# Patient Record
Sex: Female | Born: 1965
Health system: Southern US, Community
[De-identification: ages and names within clinical notes are randomized; demographics above are authoritative.]

## PROBLEM LIST (undated history)

## (undated) DIAGNOSIS — I10 Essential (primary) hypertension: Secondary | ICD-10-CM

## (undated) DIAGNOSIS — T7840XA Allergy, unspecified, initial encounter: Secondary | ICD-10-CM

## (undated) DIAGNOSIS — R51 Headache: Secondary | ICD-10-CM

## (undated) DIAGNOSIS — E785 Hyperlipidemia, unspecified: Secondary | ICD-10-CM

## (undated) DIAGNOSIS — F329 Major depressive disorder, single episode, unspecified: Secondary | ICD-10-CM

## (undated) DIAGNOSIS — G4730 Sleep apnea, unspecified: Secondary | ICD-10-CM

## (undated) DIAGNOSIS — J069 Acute upper respiratory infection, unspecified: Secondary | ICD-10-CM

## (undated) DIAGNOSIS — F419 Anxiety disorder, unspecified: Secondary | ICD-10-CM

## (undated) DIAGNOSIS — M1990 Unspecified osteoarthritis, unspecified site: Secondary | ICD-10-CM

## (undated) DIAGNOSIS — D649 Anemia, unspecified: Secondary | ICD-10-CM

## (undated) DIAGNOSIS — E119 Type 2 diabetes mellitus without complications: Secondary | ICD-10-CM

## (undated) DIAGNOSIS — R011 Cardiac murmur, unspecified: Secondary | ICD-10-CM

## (undated) DIAGNOSIS — K449 Diaphragmatic hernia without obstruction or gangrene: Secondary | ICD-10-CM

## (undated) DIAGNOSIS — K219 Gastro-esophageal reflux disease without esophagitis: Secondary | ICD-10-CM

## (undated) DIAGNOSIS — F32A Depression, unspecified: Secondary | ICD-10-CM

## (undated) HISTORY — DX: Allergy, unspecified, initial encounter: T78.40XA

## (undated) HISTORY — DX: Essential (primary) hypertension: I10

## (undated) HISTORY — PX: FRACTURE SURGERY: SHX138

## (undated) HISTORY — DX: Headache: R51

## (undated) HISTORY — DX: Anemia, unspecified: D64.9

## (undated) HISTORY — DX: Cardiac murmur, unspecified: R01.1

## (undated) HISTORY — DX: Acute upper respiratory infection, unspecified: J06.9

## (undated) HISTORY — DX: Type 2 diabetes mellitus without complications: E11.9

## (undated) HISTORY — DX: Unspecified osteoarthritis, unspecified site: M19.90

## (undated) HISTORY — DX: Depression, unspecified: F32.A

## (undated) HISTORY — DX: Major depressive disorder, single episode, unspecified: F32.9

## (undated) HISTORY — DX: Sleep apnea, unspecified: G47.30

## (undated) HISTORY — DX: Diaphragmatic hernia without obstruction or gangrene: K44.9

## (undated) HISTORY — PX: CHOLECYSTECTOMY: SHX55

## (undated) HISTORY — DX: Gastro-esophageal reflux disease without esophagitis: K21.9

## (undated) HISTORY — DX: Anxiety disorder, unspecified: F41.9

## (undated) HISTORY — PX: TONSILLECTOMY: SUR1361

## (undated) HISTORY — DX: Hyperlipidemia, unspecified: E78.5

---

## 2005-06-10 ENCOUNTER — Ambulatory Visit: Payer: Self-pay | Admitting: Internal Medicine

## 2005-11-07 ENCOUNTER — Ambulatory Visit: Payer: Self-pay | Admitting: Internal Medicine

## 2006-03-03 HISTORY — PX: ENDOMETRIAL ABLATION: SHX621

## 2006-07-16 ENCOUNTER — Ambulatory Visit: Payer: Self-pay | Admitting: Internal Medicine

## 2007-01-21 ENCOUNTER — Ambulatory Visit: Payer: Self-pay | Admitting: Unknown Physician Specialty

## 2007-01-30 ENCOUNTER — Observation Stay: Payer: Self-pay | Admitting: Orthopaedic Surgery

## 2007-07-20 ENCOUNTER — Ambulatory Visit: Payer: Self-pay | Admitting: Internal Medicine

## 2007-12-10 ENCOUNTER — Ambulatory Visit: Payer: Self-pay | Admitting: Urology

## 2008-07-17 ENCOUNTER — Ambulatory Visit: Payer: Self-pay | Admitting: Urology

## 2008-07-21 ENCOUNTER — Ambulatory Visit: Payer: Self-pay | Admitting: Internal Medicine

## 2008-11-29 ENCOUNTER — Telehealth (INDEPENDENT_AMBULATORY_CARE_PROVIDER_SITE_OTHER): Payer: Self-pay | Admitting: *Deleted

## 2008-11-30 ENCOUNTER — Telehealth (INDEPENDENT_AMBULATORY_CARE_PROVIDER_SITE_OTHER): Payer: Self-pay | Admitting: *Deleted

## 2009-08-09 ENCOUNTER — Ambulatory Visit: Payer: Self-pay | Admitting: Internal Medicine

## 2009-10-04 ENCOUNTER — Ambulatory Visit: Payer: Self-pay | Admitting: Urology

## 2010-08-21 ENCOUNTER — Ambulatory Visit: Payer: Self-pay | Admitting: Internal Medicine

## 2010-11-12 ENCOUNTER — Other Ambulatory Visit: Payer: Self-pay | Admitting: Orthopedic Surgery

## 2010-11-12 DIAGNOSIS — M79672 Pain in left foot: Secondary | ICD-10-CM

## 2010-11-12 DIAGNOSIS — M25572 Pain in left ankle and joints of left foot: Secondary | ICD-10-CM

## 2010-11-13 ENCOUNTER — Emergency Department: Payer: Self-pay | Admitting: Emergency Medicine

## 2010-11-19 ENCOUNTER — Ambulatory Visit
Admission: RE | Admit: 2010-11-19 | Discharge: 2010-11-19 | Disposition: A | Discharge status: Home / Self Care | Payer: BC Managed Care – PPO | Source: Ambulatory Visit | Attending: Orthopedic Surgery | Admitting: Orthopedic Surgery

## 2010-11-19 ENCOUNTER — Other Ambulatory Visit: Payer: Self-pay | Admitting: Orthopedic Surgery

## 2010-11-19 DIAGNOSIS — M25572 Pain in left ankle and joints of left foot: Secondary | ICD-10-CM

## 2010-11-19 DIAGNOSIS — M79672 Pain in left foot: Secondary | ICD-10-CM

## 2011-01-02 ENCOUNTER — Encounter (HOSPITAL_COMMUNITY): Payer: BC Managed Care – PPO

## 2011-01-02 ENCOUNTER — Encounter (HOSPITAL_COMMUNITY): Payer: Self-pay

## 2011-01-02 LAB — SURGICAL PCR SCREEN
MRSA, PCR: INVALID — AB
Staphylococcus aureus: INVALID — AB

## 2011-01-02 LAB — HCG, SERUM, QUALITATIVE: Preg, Serum: NEGATIVE

## 2011-01-02 NOTE — Patient Instructions (Signed)
22 Sheri Hood  01/02/2011   Your procedure is scheduled on: 01/08/11  Report to [facility] at 1115 AM.  Call this number if you have problems the morning of surgery: [phone]   Remember:   LEAVE CONTACTS AT HOME   Do not eat food:After Midnight. Tues night  Do not drink clear liquids: After Midnight.Tuesday night  Take these medicines the morning of surgery with A SIP OF WATER: prolisec with sip water   Do not wear jewelry, make-up or nail polish.  Do not wear lotions, powders, or perfumes. You may wear deodorant.  Do not shave 48 hours prior to surgery.  Do not bring valuables to the hospital.  Contacts, dentures or bridgework may not be worn into surgery.  Leave suitcase in the car. After surgery it may be brought to your room.  For patients admitted to the hospital, checkout time is 11:00 AM the day of discharge.   Patients discharged the day of surgery will not be allowed to drive home.  Name and phone number of your driver: husband  Special Instructions: CHG Shower Use Special Wash: 1/2 bottle night before surgery and 1/2 bottle morning of surgery.              Regular soap face and privates, no shaving after Saturday  Please read over the following fact sheets that you were given: MRSA Information

## 2011-01-02 NOTE — Pre-Procedure Instructions (Signed)
01/02/11- MRSA  Specimen sent for culture noted

## 2011-01-05 LAB — MRSA CULTURE

## 2011-01-07 NOTE — Pre-Procedure Instructions (Signed)
ECHO from 12/30/10 on chart

## 2011-01-08 ENCOUNTER — Encounter (HOSPITAL_COMMUNITY): Payer: Self-pay | Admitting: Registered Nurse

## 2011-01-08 ENCOUNTER — Ambulatory Visit (HOSPITAL_COMMUNITY): Payer: BC Managed Care – PPO | Admitting: Registered Nurse

## 2011-01-08 ENCOUNTER — Ambulatory Visit (HOSPITAL_COMMUNITY): Payer: BC Managed Care – PPO

## 2011-01-08 ENCOUNTER — Encounter (HOSPITAL_COMMUNITY): Payer: Self-pay | Admitting: *Deleted

## 2011-01-08 ENCOUNTER — Encounter (HOSPITAL_COMMUNITY)
Admission: AD | Disposition: A | Discharge status: Home Health | Payer: Self-pay | Source: Ambulatory Visit | Attending: Orthopedic Surgery

## 2011-01-08 ENCOUNTER — Inpatient Hospital Stay (HOSPITAL_COMMUNITY)
Admission: AD | Admit: 2011-01-08 | Discharge: 2011-01-11 | DRG: 867 | Disposition: A | Discharge status: Home Health | Payer: BC Managed Care – PPO | Source: Ambulatory Visit | Attending: Orthopedic Surgery | Admitting: Orthopedic Surgery

## 2011-01-08 DIAGNOSIS — M25579 Pain in unspecified ankle and joints of unspecified foot: Secondary | ICD-10-CM

## 2011-01-08 DIAGNOSIS — Y834 Other reconstructive surgery as the cause of abnormal reaction of the patient, or of later complication, without mention of misadventure at the time of the procedure: Secondary | ICD-10-CM | POA: Diagnosis present

## 2011-01-08 DIAGNOSIS — M129 Arthropathy, unspecified: Secondary | ICD-10-CM | POA: Diagnosis present

## 2011-01-08 DIAGNOSIS — J45909 Unspecified asthma, uncomplicated: Secondary | ICD-10-CM | POA: Diagnosis present

## 2011-01-08 DIAGNOSIS — Q742 Other congenital malformations of lower limb(s), including pelvic girdle: Secondary | ICD-10-CM

## 2011-01-08 DIAGNOSIS — I10 Essential (primary) hypertension: Secondary | ICD-10-CM | POA: Diagnosis present

## 2011-01-08 DIAGNOSIS — T8489XA Other specified complication of internal orthopedic prosthetic devices, implants and grafts, initial encounter: Principal | ICD-10-CM | POA: Diagnosis present

## 2011-01-08 DIAGNOSIS — Z01812 Encounter for preprocedural laboratory examination: Secondary | ICD-10-CM

## 2011-01-08 HISTORY — PX: HARDWARE REMOVAL: SHX979

## 2011-01-08 SURGERY — REMOVAL, HARDWARE
Anesthesia: General | Site: Ankle | Laterality: Left

## 2011-01-08 MED ORDER — INSULIN ASPART 100 UNIT/ML ~~LOC~~ SOLN
0.0000 [IU] | SUBCUTANEOUS | Status: DC
  Administered 2011-01-08: 7 [IU] via SUBCUTANEOUS
  Administered 2011-01-09: 3 [IU] via SUBCUTANEOUS
  Administered 2011-01-09: 4 [IU] via SUBCUTANEOUS
  Filled 2011-01-08: qty 3

## 2011-01-08 MED ORDER — MIDAZOLAM HCL 5 MG/5ML IJ SOLN
INTRAMUSCULAR | Status: DC | PRN
  Administered 2011-01-08: 2 mg via INTRAVENOUS

## 2011-01-08 MED ORDER — HYDRALAZINE HCL 20 MG/ML IJ SOLN
INTRAMUSCULAR | Status: DC | PRN
  Administered 2011-01-08: 2.5 mg via INTRAVENOUS
  Administered 2011-01-08: 5 mg via INTRAVENOUS

## 2011-01-08 MED ORDER — ACETAMINOPHEN 10 MG/ML IV SOLN
INTRAVENOUS | Status: AC
  Filled 2011-01-08: qty 100

## 2011-01-08 MED ORDER — DROPERIDOL 2.5 MG/ML IJ SOLN
INTRAMUSCULAR | Status: DC | PRN
  Administered 2011-01-08: 0.625 mg via INTRAVENOUS

## 2011-01-08 MED ORDER — ACETAMINOPHEN 10 MG/ML IV SOLN
INTRAVENOUS | Status: DC | PRN
  Administered 2011-01-08: 1000 mg via INTRAVENOUS

## 2011-01-08 MED ORDER — KETAMINE HCL 10 MG/ML IJ SOLN
INTRAMUSCULAR | Status: DC | PRN
  Administered 2011-01-08: .2 mg via INTRAVENOUS
  Administered 2011-01-08: .15 mg via INTRAVENOUS
  Administered 2011-01-08: .2 mg via INTRAVENOUS
  Administered 2011-01-08: .3 mg via INTRAVENOUS
  Administered 2011-01-08: .15 mg via INTRAVENOUS
  Administered 2011-01-08: 1 mg via INTRAVENOUS

## 2011-01-08 MED ORDER — HYDROMORPHONE 0.3 MG/ML IV SOLN
INTRAVENOUS | Status: DC
  Administered 2011-01-08 – 2011-01-09 (×2): 7.5 mg via INTRAVENOUS
  Administered 2011-01-09: 3.9 mg via INTRAVENOUS
  Administered 2011-01-09 (×2): 2.4 mg via INTRAVENOUS
  Administered 2011-01-09: 4.5 mg via INTRAVENOUS
  Administered 2011-01-09: 1.2 mg via INTRAVENOUS
  Administered 2011-01-10: 0.9 mg via INTRAVENOUS
  Administered 2011-01-10: 1.2 mg via INTRAVENOUS
  Filled 2011-01-08 (×4): qty 25

## 2011-01-08 MED ORDER — CEFAZOLIN SODIUM 1-5 GM-% IV SOLN
2.0000 g | INTRAVENOUS | Status: AC
  Administered 2011-01-08: 2000 mg via INTRAVENOUS

## 2011-01-08 MED ORDER — LACTATED RINGERS IV SOLN
INTRAVENOUS | Status: DC
  Administered 2011-01-08: 1000 mL via INTRAVENOUS

## 2011-01-08 MED ORDER — NALOXONE HCL 0.4 MG/ML IJ SOLN
0.4000 mg | INTRAMUSCULAR | Status: DC | PRN
  Filled 2011-01-08: qty 1

## 2011-01-08 MED ORDER — SUFENTANIL CITRATE 50 MCG/ML IV SOLN
INTRAVENOUS | Status: DC | PRN
  Administered 2011-01-08 (×2): 5 ug via INTRAVENOUS
  Administered 2011-01-08: 10 ug via INTRAVENOUS
  Administered 2011-01-08: 5 ug via INTRAVENOUS
  Administered 2011-01-08: 15 ug via INTRAVENOUS
  Administered 2011-01-08 (×2): 5 ug via INTRAVENOUS

## 2011-01-08 MED ORDER — PROPOFOL 10 MG/ML IV EMUL
INTRAVENOUS | Status: DC | PRN
  Administered 2011-01-08: 200 mg via INTRAVENOUS

## 2011-01-08 MED ORDER — HYDROMORPHONE HCL PF 1 MG/ML IJ SOLN
INTRAMUSCULAR | Status: DC | PRN
  Administered 2011-01-08 (×4): 0.5 mg via INTRAVENOUS

## 2011-01-08 MED ORDER — LABETALOL HCL 5 MG/ML IV SOLN
INTRAVENOUS | Status: DC | PRN
  Administered 2011-01-08 (×3): 5 mg via INTRAVENOUS

## 2011-01-08 MED ORDER — FENTANYL CITRATE 0.05 MG/ML IJ SOLN
INTRAMUSCULAR | Status: DC | PRN
  Administered 2011-01-08: 100 ug via INTRAVENOUS
  Administered 2011-01-08: 50 ug via INTRAVENOUS
  Administered 2011-01-08: 100 ug via INTRAVENOUS

## 2011-01-08 MED ORDER — CEFAZOLIN SODIUM 1-5 GM-% IV SOLN
INTRAVENOUS | Status: AC
  Filled 2011-01-08: qty 100

## 2011-01-08 MED ORDER — ROCURONIUM BROMIDE 100 MG/10ML IV SOLN
INTRAVENOUS | Status: DC | PRN
  Administered 2011-01-08: 50 mg via INTRAVENOUS

## 2011-01-08 MED ORDER — SODIUM CHLORIDE 0.9 % IJ SOLN: 9.0000 mL | INTRAMUSCULAR | Status: DC | PRN

## 2011-01-08 MED ORDER — LACTATED RINGERS IV SOLN
INTRAVENOUS | Status: DC | PRN
  Administered 2011-01-08: 15:00:00 via INTRAVENOUS
  Administered 2011-01-08: 1000 mL via INTRAVENOUS
  Administered 2011-01-08 (×2): via INTRAVENOUS

## 2011-01-08 MED ORDER — HYDROMORPHONE HCL PF 1 MG/ML IJ SOLN: 0.2500 mg | INTRAMUSCULAR | Status: DC | PRN

## 2011-01-08 MED ORDER — BUPIVACAINE HCL 0.25 % IJ SOLN
INTRAMUSCULAR | Status: DC | PRN
  Administered 2011-01-08: 8 mL

## 2011-01-08 MED ORDER — INSULIN ASPART 100 UNIT/ML ~~LOC~~ SOLN
SUBCUTANEOUS | Status: AC
  Filled 2011-01-08: qty 3

## 2011-01-08 MED ORDER — PROMETHAZINE HCL 25 MG/ML IJ SOLN: 6.2500 mg | INTRAMUSCULAR | Status: DC | PRN

## 2011-01-08 MED ORDER — BUPIVACAINE HCL (PF) 0.25 % IJ SOLN
INTRAMUSCULAR | Status: AC
  Filled 2011-01-08: qty 30

## 2011-01-08 MED ORDER — DEXAMETHASONE SODIUM PHOSPHATE 4 MG/ML IJ SOLN
INTRAMUSCULAR | Status: DC | PRN
  Administered 2011-01-08: 10 mg via INTRAVENOUS

## 2011-01-08 MED ORDER — ONDANSETRON HCL 4 MG/2ML IJ SOLN
4.0000 mg | Freq: Four times a day (QID) | INTRAMUSCULAR | Status: DC | PRN
  Administered 2011-01-08: 4 mg via INTRAVENOUS
  Filled 2011-01-08 (×2): qty 2

## 2011-01-08 MED ORDER — LIDOCAINE HCL (CARDIAC) 20 MG/ML IV SOLN
INTRAVENOUS | Status: DC | PRN
  Administered 2011-01-08: 100 mg via INTRAVENOUS

## 2011-01-08 MED ORDER — ONDANSETRON HCL 4 MG/2ML IJ SOLN
INTRAMUSCULAR | Status: DC | PRN
  Administered 2011-01-08: 4 mg via INTRAVENOUS

## 2011-01-08 MED ORDER — ONDANSETRON HCL 4 MG/2ML IJ SOLN
INTRAMUSCULAR | Status: AC
  Filled 2011-01-08: qty 2

## 2011-01-08 SURGICAL SUPPLY — 36 items
BAG SPEC THK2 15X12 ZIP CLS (MISCELLANEOUS) ×1
BAG ZIPLOCK 12X15 (MISCELLANEOUS) ×2 IMPLANT
BANDAGE GAUZE ELAST BULKY 4 IN (GAUZE/BANDAGES/DRESSINGS) ×1 IMPLANT
BONE CHIP PRESERV 30CC PCAN30 (Bone Implant) ×2 IMPLANT
BUR RND DIAMOND ELITE 4.0 (BURR) ×1 IMPLANT
CLOTH BEACON ORANGE TIMEOUT ST (SAFETY) ×2 IMPLANT
DRAPE STERI IOBAN 125X83 (DRAPES) IMPLANT
DRSG ADAPTIC 3X8 NADH LF (GAUZE/BANDAGES/DRESSINGS) ×1 IMPLANT
DRSG EMULSION OIL 3X16 NADH (GAUZE/BANDAGES/DRESSINGS) ×2 IMPLANT
DRSG PAD ABDOMINAL 8X10 ST (GAUZE/BANDAGES/DRESSINGS) ×2 IMPLANT
ELECT REM PT RETURN 9FT ADLT (ELECTROSURGICAL) ×2
ELECTRODE REM PT RTRN 9FT ADLT (ELECTROSURGICAL) ×1 IMPLANT
GLOVE BIO SURGEON STRL SZ7.5 (GLOVE) ×2 IMPLANT
GLOVE BIOGEL PI IND STRL 8 (GLOVE) ×1 IMPLANT
GLOVE BIOGEL PI INDICATOR 8 (GLOVE) ×1
GLOVE ECLIPSE 8.0 STRL XLNG CF (GLOVE) ×2 IMPLANT
GRAFT BNE CANC CHIPS 1-8 30CC (Bone Implant) IMPLANT
KIT BASIN OR (CUSTOM PROCEDURE TRAY) ×2 IMPLANT
MANIFOLD NEPTUNE II (INSTRUMENTS) ×2 IMPLANT
NS IRRIG 1000ML POUR BTL (IV SOLUTION) ×2 IMPLANT
PACK LOWER EXTREMITY WL (CUSTOM PROCEDURE TRAY) ×2 IMPLANT
PADDING CAST COTTON 6X4 STRL (CAST SUPPLIES) ×1 IMPLANT
PADDING WEBRIL 4 STERILE (GAUZE/BANDAGES/DRESSINGS) ×1 IMPLANT
POSITIONER SURGICAL ARM (MISCELLANEOUS) ×2 IMPLANT
SPLINT FAST PLASTER 5X30 (CAST SUPPLIES) ×1
SPLINT PLASTER CAST FAST 5X30 (CAST SUPPLIES) IMPLANT
SPONGE GAUZE 4X4 12PLY (GAUZE/BANDAGES/DRESSINGS) ×2 IMPLANT
STAPLER VISISTAT 35W (STAPLE) IMPLANT
SUCTION FRAZIER TIP 10 FR DISP (SUCTIONS) ×1 IMPLANT
SUT ETHILON 4 0 PS 2 18 (SUTURE) ×2 IMPLANT
SUT MNCRL AB 4-0 PS2 18 (SUTURE) IMPLANT
SUT VIC AB 1 CT1 27 (SUTURE) ×2
SUT VIC AB 1 CT1 27XBRD ANTBC (SUTURE) ×1 IMPLANT
SUT VIC AB 2-0 CT1 27 (SUTURE) ×2
SUT VIC AB 2-0 CT1 TAPERPNT 27 (SUTURE) ×1 IMPLANT
WATER STERILE IRR 1500ML POUR (IV SOLUTION) ×2 IMPLANT

## 2011-01-08 NOTE — Preoperative (Signed)
Beta Blockers   Reason not to administer Beta Blockers:Not Applicable 

## 2011-01-08 NOTE — Transfer of Care (Signed)
Immediate Anesthesia Transfer of Care Note  Patient: Sheri Hood  Procedure(s) Performed:  HARDWARE REMOVAL - screw and plate left ankle and Fusion of Subtalor Joint Left Ankle    (Large c-arm)   Patient Location: PACU  Anesthesia Type: General  Level of Consciousness: awake, patient cooperative and responds to stimulation  Airway & Oxygen Therapy: Patient Spontanous Breathing and Patient connected to face mask oxygen  Post-op Assessment: Report given to PACU RN, Post -op Vital signs reviewed and stable and Patient moving all extremities X 4  Post vital signs: Reviewed and stable  Complications: No apparent anesthesia complications

## 2011-01-08 NOTE — Anesthesia Procedure Notes (Signed)
Procedures

## 2011-01-08 NOTE — Anesthesia Preprocedure Evaluation (Signed)
Anesthesia Evaluation  Patient identified by MRN, date of birth, ID band Patient awake    Reviewed: Allergy & Precautions, H&P , NPO status , Patient's Chart, lab work & pertinent test results  Airway Mallampati: II TM Distance: >3 FB Neck ROM: Full    Dental No notable dental hx. (+) Teeth Intact   Pulmonary neg pulmonary ROS,  clear to auscultation  Pulmonary exam normal       Cardiovascular neg cardio ROS Regular Normal    Neuro/Psych Negative Neurological ROS  Negative Psych ROS   GI/Hepatic negative GI ROS, Neg liver ROS,   Endo/Other  Negative Endocrine ROS  Renal/GU negative Renal ROS  Genitourinary negative   Musculoskeletal negative musculoskeletal ROS (+)   Abdominal (+) obese,   Peds negative pediatric ROS (+)  Hematology negative hematology ROS (+)   Anesthesia Other Findings   Reproductive/Obstetrics negative OB ROS                           Anesthesia Physical Anesthesia Plan  ASA: II  Anesthesia Plan: General   Post-op Pain Management:    Induction: Intravenous  Airway Management Planned: Oral ETT  Additional Equipment:   Intra-op Plan:   Post-operative Plan: Extubation in OR  Informed Consent: I have reviewed the patients History and Physical, chart, labs and discussed the procedure including the risks, benefits and alternatives for the proposed anesthesia with the patient or authorized representative who has indicated his/her understanding and acceptance.     Plan Discussed with: CRNA  Anesthesia Plan Comments:         Anesthesia Quick Evaluation

## 2011-01-08 NOTE — Brief Op Note (Signed)
01/08/2011  5:42 PM  PATIENT:  Henrene Pastor  45 y.o. female  PRE-OPERATIVE DIAGNOSIS:  left ankle hardware retained, talus calcaneal coalitions  POST-OPERATIVE DIAGNOSIS:  left ankle retained hardware, talus calcaneous coalitions  PROCEDURE:  Procedure(s): HARDWARE REMOVALand subtalar fusion   SURGEON:  Surgeon(s): Fayrene Fearing P Aplington Ronald A Gioffre  PHYSICIAN ASSISTANT:   ASSISTANTS: none   ANESTHESIA:   general  EBL:  Total I/O In: 2000 [I.V.:2000] Out: 200 [Urine:200]  BLOOD ADMINISTERED:none  DRAINS: none   LOCAL MEDICATIONS USED:  MARCAINE 10 CC  SPECIMEN:  No Specimen  DISPOSITION OF SPECIMEN:  N/A  COUNTS:  YES  TOURNIQUET:   Total Tourniquet Time Documented: Thigh (Left) - 131 minutes  DICTATION: .Other Dictation: Dictation Number [id number]  PLAN OF CARE: Admit for overnight observation yes PATIENT DISPOSITION:  PACU - hemodynamically stable.   Delay start of Pharmacological VTE agent (>24hrs) due to surgical blood loss or risk of bleeding:  No

## 2011-01-08 NOTE — Op Note (Signed)
NAMEDENAYA, HORN              ACCOUNT NO.:  [id number]  MEDICAL RECORD NO.:  [id number]  LOCATION:  [facility]                         FACILITY:  [facility]  PHYSICIAN:  Marlowe Kays, M.D.  DATE OF BIRTH:  [birth year]  DATE OF PROCEDURE: DATE OF DISCHARGE:                              OPERATIVE REPORT   PREOPERATIVE DIAGNOSES: 1. Painful left ankle secondary to two retained screws in medial     malleolus and  Screws  and plate in the distal fibula. 2. Talocalcaneal coalition.  POSTOPERATIVE DIAGNOSES: 1. Painful left ankle secondary to two retained screws  in medial     malleolus and   screws and plate in the distal fibula. 2. Talocalcaneal coalition.  OPERATION: 1. Removal of 2 screws from medial malleolus and screws and plate from     distal fibula. 2. Subtalar fusion using bank allograft.  SURGEON:  Marlowe Kays, M.D.  ASSISTANT:  Georges Lynch. Darrelyn Hillock, M.D.  ANESTHESIA:  General.  PATHOLOGY AND JUSTIFICATION FOR PROCEDURE:  Stated in diagnosis. Diagnosis was made by physical exam and CT scan.  PROCEDURE:  Satisfied general anesthesia, Foley catheter inserted. Pneumatic tourniquet applied and left leg was Esmarch out nonsterilely and tourniquet inflated to 325 mmHg.  Left leg was then prepped with DuraPrep from midcalf to toes and draped in sterile field.  Time-out performed.  Using the C-arm, I was able to locate the 2 screws in the medial malleolus and remove them without difficulty.  I then went to the distal fibula and removed the plate and screws, again without difficulty.  Both wounds were then irrigated with sterile saline, infiltrated with 0.25% Marcaine plain.  I then closed both wounds with 2- 0 Vicryl, subcutaneous tissue, and interrupted 4-0 nylon mattress sutures in skin.  Dr. Darrelyn Hillock then joined me for the second part of the case, made a transverse incision about a cm below the tip of the fibula. The peroneal tendons and sural nerve were protected and  retracted inferiorly.  The sinus tarsus cleared of fatty tissue and the extensor brevis muscle dissected from its bony attachment proximally and retracted distally.  With the assistance of the C-arm, I was able to positively identify the subtalar joint, and then began removing the joint surfaces with a combination of a small osteotome, curette, and small bur.  Progress was noted by observation and by C-arm.  When I could feel the inner cortex that is medially with a bur, we stopped the resection, made 2 nice raw bone surfaces.  After irrigating the wound well, I packed the gap with allograft, small bites.  Followup x-rays; AP and lateral showed good obliteration of the subtalar joint.  I infiltrated this wound as well with Marcaine 0.25% plain.  I reattached the extensor brevis muscle with 2-0 Vicryl and subcutaneous tissue with the same.  Skin and subcutaneous tissue were closed with interrupted 4-0 nylon mattress sutures.  Tourniquet was then released, it was slightly over 2 hours of tourniquet time.  Betadine, Adaptic, dry sterile dressing and a well-padded short-leg splint cast was applied with the ankle at 9 degrees and in minimal valgus.  It appeared to be stable and did not seem to need any internal  fixation.  At time of this dictation, she was on her way to recovery room in satisfactory condition with no known complications.  BLOOD LOSS:  None.          ______________________________ Marlowe Kays, M.D.     JA/MEDQ  D:  01/08/2011  T:  01/08/2011  Job:  [id number]

## 2011-01-08 NOTE — Anesthesia Postprocedure Evaluation (Signed)
  Anesthesia Post-op Note  Patient: Sheri Hood  Procedure(s) Performed:  HARDWARE REMOVAL - screw and plate left ankle and Fusion of Subtalor Joint Left Ankle    (Large c-arm)   Patient Location: PACU  Anesthesia Type: General  Level of Consciousness: awake and alert   Airway and Oxygen Therapy: Patient Spontanous Breathing  Post-op Pain: mild  Post-op Assessment: Post-op Vital signs reviewed, Patient's Cardiovascular Status Stable, Respiratory Function Stable, Patent Airway and No signs of Nausea or vomiting  Post-op Vital Signs: stable; hr 106, o2sat 98% on 4l/min nasal cannula, bp 129/70; urine output good. No dyspnea.  Chest xray showed mild cardiomegaly and bibasilar atelectasis. Hr did not respond to fluids nor narcotics.  Patient in no distress. Transfer to floor.  Complications: No apparent anesthesia complications

## 2011-01-08 NOTE — H&P (Signed)
Sheri Hood is an 45 y.o. female.   Chief Complaint: left ankle pain   HPI: history of orif ankle fractureand talo-calcaneal coalition  Past Medical History  Diagnosis Date  . Hypertension   . Heart murmur     recent diagnosed- / ehho 12/30/10 report on chart  . Headache     h/x migraines  . Asthma   . Recurrent upper respiratory infection (URI)     occ sore throat and slightly stuffy- no fever or cough  . Hiatal hernia   . Arthritis     arhtritis middle back, scolosis  . Anemia     s/p ablation for heavy bleeding     Past Surgical History  Procedure Date  . Cholecystectomy   . Tonsillectomy   . Ablation colpoclesis   . Fracture surgery     History reviewed. No pertinent family history. Social History:  reports that she quit smoking about 23 years ago. Her smoking use included Cigarettes. She has a 2 pack-year smoking history. She does not have any smokeless tobacco history on file. She reports that she drinks alcohol. She reports that she does not use illicit drugs.  Allergies:  Allergies  Allergen Reactions  . Benadryl (Altaryl) Other (See Comments)    Keeps awake  . Codeine Other (See Comments)    Keeps awake    Medications Prior to Admission  Medication Dose Route Frequency Provider Last Rate Last Dose  . ceFAZolin (ANCEF) IVPB 1 g/50 mL premix  2 g Intravenous 60 min Pre-Op James P Aplington      . lactated ringers infusion   Intravenous Continuous Azell Der, MD 100 mL/hr at 01/08/11 1336 1,000 mL at 01/08/11 1336   Medications Prior to Admission  Medication Sig Dispense Refill  . citalopram (CELEXA) 40 MG tablet Take 40 mg by mouth Nightly.       . hydrochlorothiazide (HYDRODIURIL) 25 MG tablet Take 25 mg by mouth every morning.       Marland Kitchen ibuprofen (ADVIL,MOTRIN) 200 MG tablet Take 200 mg by mouth every 6 (six) hours as needed. For pain      . omeprazole (PRILOSEC OTC) 20 MG tablet Take 20 mg by mouth 2 (two) times daily.       Marland Kitchen OVER THE COUNTER  MEDICATION Take 1 tablet by mouth at bedtime. Pt takes phenylephrine 5 MG- Guaifenesin 200mg .      . vitamin E 400 UNIT capsule Take 800 Units by mouth at bedtime.         No results found for this or any previous visit (from the past 48 hour(s)). No results found.  Review of Systems  Constitutional: Negative.   HENT: Negative.   Eyes: Negative.   Respiratory: Negative.   Cardiovascular: Negative.   Genitourinary: Negative.   Musculoskeletal: Negative for joint pain (lt ankle pain).  Skin: Negative.   Endo/Heme/Allergies: Negative.   Psychiatric/Behavioral: Negative.     Blood pressure 122/83, pulse 75, temperature 97.4 F (36.3 C), resp. rate 18, last menstrual period 07/13/2010, SpO2 97.00%. Physical Exam  Constitutional: She is oriented to person, place, and time. She appears well-developed and well-nourished.  HENT:  Head: Normocephalic.  Eyes: Pupils are equal, round, and reactive to light.  Neck: Normal range of motion. Neck supple.  Cardiovascular: Normal rate and regular rhythm.   Respiratory: Effort normal and breath sounds normal.  GI: Soft. Bowel sounds are normal.  Musculoskeletal: She exhibits tenderness.       Left ankle: She exhibits swelling.  tenderness. Lateral malleolus and medial malleolus tenderness found.       Feet:  Neurological: She is alert and oriented to person, place, and time. She has normal reflexes.  Skin: Skin is warm and dry.  Psychiatric: She has a normal mood and affect. Her behavior is normal. Judgment and thought content normal.     Assessment/Plan Retained plates and screws lt ankle. Removal hardware and subtalar fusion  APLINGTON,JAMES P 01/08/2011, 2:42 PM

## 2011-01-09 ENCOUNTER — Encounter (HOSPITAL_COMMUNITY): Payer: Self-pay | Admitting: *Deleted

## 2011-01-09 LAB — GLUCOSE, CAPILLARY
Glucose-Capillary: 173 mg/dL — ABNORMAL HIGH (ref 70–99)
Glucose-Capillary: 144 mg/dL — ABNORMAL HIGH (ref 70–99)

## 2011-01-09 LAB — HEMOGLOBIN A1C
Hgb A1c MFr Bld: 6.1 % — ABNORMAL HIGH (ref <5.7)
Mean Plasma Glucose: 128 mg/dL — ABNORMAL HIGH (ref <117)

## 2011-01-09 MED ORDER — HYDROCHLOROTHIAZIDE 25 MG PO TABS
25.0000 mg | ORAL_TABLET | Freq: Every day | ORAL | Status: DC
  Administered 2011-01-09 – 2011-01-10 (×2): 25 mg via ORAL
  Filled 2011-01-09 (×3): qty 1

## 2011-01-09 MED ORDER — CITALOPRAM HYDROBROMIDE 40 MG PO TABS
40.0000 mg | ORAL_TABLET | Freq: Every evening | ORAL | Status: DC
  Administered 2011-01-09 – 2011-01-10 (×2): 40 mg via ORAL
  Filled 2011-01-09 (×3): qty 1

## 2011-01-09 MED ORDER — VITAMIN E 180 MG (400 UNIT) PO CAPS
800.0000 [IU] | ORAL_CAPSULE | Freq: Every day | ORAL | Status: DC
  Administered 2011-01-09 – 2011-01-10 (×2): 800 [IU] via ORAL
  Filled 2011-01-09 (×3): qty 2

## 2011-01-09 MED ORDER — METOCLOPRAMIDE HCL 10 MG PO TABS: 5.0000 mg | ORAL_TABLET | Freq: Three times a day (TID) | ORAL | Status: DC | PRN

## 2011-01-09 MED ORDER — SENNOSIDES-DOCUSATE SODIUM 8.6-50 MG PO TABS
1.0000 | ORAL_TABLET | Freq: Every evening | ORAL | Status: DC | PRN
  Filled 2011-01-09: qty 1

## 2011-01-09 MED ORDER — PANTOPRAZOLE SODIUM 40 MG PO TBEC
40.0000 mg | DELAYED_RELEASE_TABLET | Freq: Two times a day (BID) | ORAL | Status: DC
  Administered 2011-01-09 – 2011-01-10 (×4): 40 mg via ORAL
  Filled 2011-01-09 (×5): qty 1

## 2011-01-09 MED ORDER — ACETAMINOPHEN 325 MG PO TABS
650.0000 mg | ORAL_TABLET | Freq: Four times a day (QID) | ORAL | Status: DC | PRN
  Administered 2011-01-09: 650 mg via ORAL
  Filled 2011-01-09: qty 2

## 2011-01-09 MED ORDER — ONDANSETRON HCL 4 MG PO TABS: 4.0000 mg | ORAL_TABLET | Freq: Four times a day (QID) | ORAL | Status: DC | PRN

## 2011-01-09 MED ORDER — ONDANSETRON HCL 4 MG/2ML IJ SOLN
4.0000 mg | Freq: Four times a day (QID) | INTRAMUSCULAR | Status: DC | PRN
  Administered 2011-01-09 – 2011-01-11 (×4): 4 mg via INTRAVENOUS
  Filled 2011-01-09 (×3): qty 2

## 2011-01-09 MED ORDER — INSULIN ASPART 100 UNIT/ML ~~LOC~~ SOLN
0.0000 [IU] | Freq: Three times a day (TID) | SUBCUTANEOUS | Status: DC
  Administered 2011-01-09: 3 [IU] via SUBCUTANEOUS
  Filled 2011-01-09: qty 3

## 2011-01-09 MED ORDER — ENOXAPARIN SODIUM 30 MG/0.3ML ~~LOC~~ SOLN
30.0000 mg | Freq: Two times a day (BID) | SUBCUTANEOUS | Status: DC
  Administered 2011-01-09 – 2011-01-11 (×5): 30 mg via SUBCUTANEOUS
  Filled 2011-01-09 (×8): qty 0.3

## 2011-01-09 MED ORDER — METOCLOPRAMIDE HCL 5 MG/ML IJ SOLN
5.0000 mg | Freq: Three times a day (TID) | INTRAMUSCULAR | Status: DC | PRN
  Administered 2011-01-10 – 2011-01-11 (×3): 10 mg via INTRAVENOUS
  Filled 2011-01-09 (×3): qty 2

## 2011-01-09 MED ORDER — OMEPRAZOLE MAGNESIUM 20 MG PO TBEC: 20.0000 mg | DELAYED_RELEASE_TABLET | Freq: Two times a day (BID) | ORAL | Status: DC

## 2011-01-09 MED ORDER — DEXTROSE-NACL 5-0.45 % IV SOLN
INTRAVENOUS | Status: DC
  Administered 2011-01-09 – 2011-01-11 (×4): via INTRAVENOUS

## 2011-01-09 MED ORDER — TEMAZEPAM 15 MG PO CAPS: 15.0000 mg | ORAL_CAPSULE | Freq: Every evening | ORAL | Status: DC | PRN

## 2011-01-09 NOTE — Progress Notes (Signed)
Physical Therapy Evaluation Patient Details Name: Sheri Hood MRN: [id number] DOB: 05-29-1965 Today's Date: 01/09/2011 9:14-9:40, EV2  Problem List: There is no problem list on file for this patient.   Past Medical History:  Past Medical History  Diagnosis Date  . Hypertension   . Heart murmur     recent diagnosed- / ehho 12/30/10 report on chart  . Headache     h/x migraines  . Asthma   . Recurrent upper respiratory infection (URI)     occ sore throat and slightly stuffy- no fever or cough  . Hiatal hernia   . Arthritis     arhtritis middle back, scolosis  . Anemia     s/p ablation for heavy bleeding    Past Surgical History:  Past Surgical History  Procedure Date  . Cholecystectomy   . Tonsillectomy   . Ablation colpoclesis   . Fracture surgery     PT Assessment/Plan/Recommendation PT Assessment Clinical Impression Statement: Pt is status post removal of L ankle hardware and subtalar fusion.  She moved well with PT, but was limited by nausea and feelings of grogginess.  Pt's pain 2/10 except when she got up it increased to 5/10, but subsided to 2/10 once in recliner. PT Recommendation/Assessment: Patient will need skilled PT in the acute care venue PT Problem List: Decreased knowledge of use of DME;Decreased safety awareness;Decreased mobility Barriers to Discharge: Inaccessible home environment PT Therapy Diagnosis : Difficulty walking PT Plan PT Frequency: Min 6X/week PT Treatment/Interventions: DME instruction;Gait training;Stair training;Patient/family education;Functional mobility training;Balance training PT Recommendation Recommendations for Other Services: OT consult Follow Up Recommendations: Home health PT Equipment Recommended: Rolling walker with 5" wheels (Pt has SW.  See how she does with RW & check her preference.) PT Goals  Acute Rehab PT Goals PT Goal Formulation: With patient Time For Goal Achievement: 7 days Pt will go Supine/Side to Sit:  with modified independence PT Goal: Supine/Side to Sit - Progress: Progressing toward goal Pt will Transfer Sit to Stand/Stand to Sit: with modified independence PT Transfer Goal: Sit to Stand/Stand to Sit - Progress: Progressing toward goal Pt will Ambulate: 51 - 150 feet;with supervision;with rolling walker;with standard walker (Pt has SW at home, but may benefit from RW) PT Goal: Ambulate - Progress: Progressing toward goal Pt will Go Up / Down Stairs: 3-5 stairs;with min assist;with least restrictive assistive device PT Goal: Up/Down Stairs - Progress: Not met  PT Evaluation Precautions/Restrictions  Restrictions Weight Bearing Restrictions: Yes LLE Weight Bearing: Non weight bearing Prior Functioning  Home Living Lives With: Spouse;Son Waldron Help From: Family Type of Home: House Home Layout: Two level;Bed/bath upstairs Alternate Level Stairs-Rails: None Alternate Level Stairs-Number of Steps: split level  Home Access: Stairs to enter Entrance Stairs-Rails: None Entrance Stairs-Number of Steps: 2 then 1 Home Adaptive Equipment: Bedside commode/3-in-1;Walker - standard;Wheelchair - manual Prior Function Level of Independence: Independent with gait    Extremity Assessment LLE Assessment LLE Assessment: Within Functional Limits (except L ankle in short leg cast.  able to wiggle toes) Mobility (including Balance) Bed Mobility Bed Mobility: Yes Supine to Sit: 4: Min assist Supine to Sit Details (indicate cue type and reason): A for L LE Transfers Transfers: Yes Sit to Stand: 4: Min assist Sit to Stand Details (indicate cue type and reason): cues for hand placment Ambulation/Gait Ambulation/Gait: Yes Ambulation/Gait Assistance: 4: Min assist Ambulation/Gait Assistance Details (indicate cue type and reason): Cues for hand placement Ambulation Distance (Feet): 5 Feet Assistive device: Rolling walker Gait Pattern: Decreased  step length - right       End of Session PT  - End of Session Equipment Utilized During Treatment: Gait belt Activity Tolerance: Patient tolerated treatment well;Other (comment) (nausea limited treatment.) Patient left: in chair;with call bell in reach Nurse Communication: Mobility status for transfers General Behavior During Session: [facility] for tasks performed Cognition: [facility] for tasks performed  [facility] LUBECK 01/09/2011, 10:03 AM

## 2011-01-09 NOTE — Progress Notes (Signed)
Subjective: 1 Day Post-Op Procedure(s) (LRB): HARDWARE REMOVAL (Left) Patient reports pain as 9 on 0-10 scale.    Objective: Vital signs in last 24 hours: Temp:  [97.1 F (36.2 C)-98.4 F (36.9 C)] 97.8 F (36.6 C) (11/08 0345) Pulse Rate:  [75-117] 111  (11/08 0345) Resp:  [10-18] 17  (11/08 0400) BP: (122-152)/(68-83) 128/78 mmHg (11/08 0345) SpO2:  [95 %-99 %] 99 % (11/08 0400) Weight:  [96.163 kg (212 lb)] 212 lb (96.163 kg) (11/08 0001)  Intake/Output from previous day: 11/07 0701 - 11/08 0700 In: 3550 [I.V.:3550] Out: 1500 [Urine:1400; Blood:100] Intake/Output this shift:    No results found for this basename: HGB:5 in the last 72 hours No results found for this basename: WBC:2,RBC:2,HCT:2,PLT:2 in the last 72 hours No results found for this basename: NA:2,K:2,CL:2,CO2:2,BUN:2,CREATININE:2,GLUCOSE:2,CALCIUM:2 in the last 72 hours No results found for this basename: LABPT:2,INR:2 in the last 72 hours  Neurologically intact  Assessment/Plan: 1 Day Post-Op Procedure(s) (LRB): HARDWARE REMOVAL (Left) Advance diet  APLINGTON,JAMES P 01/09/2011, 7:43 AM

## 2011-01-10 MED ORDER — MEPERIDINE HCL 50 MG PO TABS: 50.0000 mg | ORAL_TABLET | ORAL | Status: DC | PRN

## 2011-01-10 MED ORDER — MEPERIDINE HCL 50 MG PO TABS: 100.0000 mg | ORAL_TABLET | ORAL | Status: DC | PRN

## 2011-01-10 MED ORDER — MEPERIDINE HCL 100 MG/ML IJ SOLN: 50.0000 mg | INTRAMUSCULAR | Status: DC | PRN

## 2011-01-10 MED ORDER — MEPERIDINE HCL 50 MG/ML IJ SOLN
50.0000 mg | INTRAMUSCULAR | Status: DC | PRN
  Administered 2011-01-10 (×3): 50 mg via INTRAMUSCULAR
  Filled 2011-01-10 (×4): qty 1

## 2011-01-10 NOTE — Progress Notes (Signed)
Physical Therapy Treatment Patient Details Name: Sheri Hood MRN: [id number] DOB: [birth year] Today's Date: 01/10/2011 1100-1115 1G PT Assessment/Plan  PT - Assessment/Plan Comments on Treatment Session: pt willing to work with PT in spite of nausea.  Pt has several steps to enter. pt. states she has a WC and Spouse can bump her upthe steps PT Plan: Discharge plan remains appropriate;Frequency remains appropriate PT Frequency: Min 6X/week Follow Up Recommendations: Home health PT Equipment Recommended: None recommended by PT PT Goals  Acute Rehab PT Goals PT Goal: Supine/Side to Sit - Progress: Progressing toward goal PT Transfer Goal: Sit to Stand/Stand to Sit - Progress: Progressing toward goal PT Goal: Ambulate - Progress: Progressing toward goal PT Goal: Up/Down Stairs - Progress: Progressing toward goal  PT Treatment Precautions/Restrictions  Restrictions Weight Bearing Restrictions: Yes LLE Weight Bearing: Non weight bearing Mobility (including Balance) Bed Mobility Bed Mobility: Yes Supine to Sit: 4: Min assist Transfers Transfers: Yes Sit to Stand: 4: Min assist;With upper extremity assist Sit to Stand Details (indicate cue type and reason): VC to push from bed Ambulation/Gait Ambulation/Gait: Yes Ambulation/Gait Assistance: 1: +2 Total assist Ambulation/Gait Assistance Details (indicate cue type and reason): PT= 75 % pt maintained NWB.   Ambulation Distance (Feet): 20 Feet Assistive device: Rolling walker Gait Pattern:  (hop on RLE only)    Exercise    End of Session PT - End of Session Activity Tolerance: Patient tolerated treatment well (limited by nausea) Patient left: in bed;in chair;with call bell in reach General Behavior During Session: Flat affect (pt very quiet, few words) Cognition: WFL for tasks performed  Rada Hay 01/10/2011, 12:46 PM

## 2011-01-10 NOTE — Progress Notes (Signed)
Physical Therapy Treatment Patient Details Name: Sheri Hood MRN: [id number] DOB: Sep 20, 1965 Today's Date: 01/10/2011 [id number] PT Assessment/Plan  PT - Assessment/Plan Comments on Treatment Session: pt willing to work with PT in spite of nausea.  Pt has several steps to enter. pt. states she has a WC and Spouse can bump her upthe steps PT Plan: Discharge plan remains appropriate;Frequency remains appropriate PT Frequency: Min 6X/week Follow Up Recommendations: Home health PT Equipment Recommended: None recommended by PT PT Goals  Acute Rehab PT Goals PT Goal: Supine/Side to Sit - Progress: Progressing toward goal PT Transfer Goal: Sit to Stand/Stand to Sit - Progress: Progressing toward goal PT Goal: Ambulate - Progress: Progressing toward goal PT Goal: Up/Down Stairs - Progress: Progressing toward goal  PT Treatment Precautions/Restrictions  Restrictions Weight Bearing Restrictions: Yes LLE Weight Bearing: Non weight bearing Mobility (including Balance) Bed Mobility Bed Mobility: Yes  Sit to Supine - Right: 4: Min assist;HOB flat Sit to Supine - Right Details (indicate cue type and reason): support LLE Transfers Transfers: Yes Sit to Stand: 4: Min assist;With upper extremity assist;From chair/3-in-1 Sit to Stand Details (indicate cue type and reason): pt. demonstrated push off of chair arms Stand to Sit: 4: Min assist;Without upper extremity assist;To bed Ambulation/Gait Ambulation/Gait: Yes Ambulation/Gait Assistance: 4: Min assist Ambulation/Gait Assistance Details (indicate cue type and reason): pt demonstrated NWB on LLE Ambulation Distance (Feet): 20 Feet Assistive device: Rolling walker Gait Pattern:  ("hop" onRLE, NWB on LLE) Gait velocity: slow, steady    Exercise    End of Session PT - End of Session Activity Tolerance: Patient tolerated treatment well (c/o nausea) Patient left: in bed Nurse Communication:  (pt. not due nausea med at this time per  RN) General Behavior During Session: Flat affect Cognition: WFL for tasks performed  Rada Hay 01/10/2011, 2:42 PM

## 2011-01-10 NOTE — Progress Notes (Signed)
Subjective: 2 Days Post-Op Procedure(s) (LRB): HARDWARE REMOVAL (Left) Patient reports pain as 5 on 0-10 scale.    Objective: Vital signs in last 24 hours: Temp:  [97.7 F (36.5 C)-99.3 F (37.4 C)] 98.1 F (36.7 C) (11/09 0530) Pulse Rate:  [78-98] 78  (11/09 0530) Resp:  [10-16] 10  (11/09 0530) BP: (102-146)/(58-79) 146/79 mmHg (11/09 0530) SpO2:  [95 %-100 %] 96 % (11/09 0530)  Intake/Output from previous day: 11/08 0701 - 11/09 0700 In: 680 [P.O.:680] Out: 2075 [Urine:2075] Intake/Output this shift:    No results found for this basename: HGB:5 in the last 72 hours No results found for this basename: WBC:2,RBC:2,HCT:2,PLT:2 in the last 72 hours No results found for this basename: NA:2,K:2,CL:2,CO2:2,BUN:2,CREATININE:2,GLUCOSE:2,CALCIUM:2 in the last 72 hours No results found for this basename: LABPT:2,INR:2 in the last 72 hours  Neurologically intact  Assessment/Plan: 2 Days Post-Op Procedure(s) (LRB): HARDWARE REMOVAL (Left) Up with therapy Very nauseated on dilaudid.  Will switch to demerol. Encouraged to ambulate. Luismanuel Corman P 01/10/2011, 7:17 AM

## 2011-01-11 MED ORDER — MEPERIDINE HCL 50 MG PO TABS: 50.0000 mg | ORAL_TABLET | ORAL | Status: AC | PRN

## 2011-01-11 NOTE — Progress Notes (Signed)
Pt was discharged earlier in shift. Was discharged to home. Spouse came in and transported pt to home. Pt was given discharged information with spouse at bedside. No concerns were voiced. Also given prescription. Left unit in wheelchair pushed by nurse tech. Left in good condition.

## 2011-01-11 NOTE — Progress Notes (Signed)
Subjective: 3 Days Post-Op Procedure(s) (LRB): HARDWARE REMOVAL (Left) Patient reports pain as 2 on 0-10 scale.    Objective: Vital signs in last 24 hours: Temp:  [97.5 F (36.4 C)-98.2 F (36.8 C)] 97.7 F (36.5 C) (11/10 0601) Pulse Rate:  [74-86] 78  (11/10 0601) Resp:  [12-16] 16  (11/10 0601) BP: (110-138)/(66-84) 110/66 mmHg (11/10 0601) SpO2:  [96 %-99 %] 97 % (11/10 0601)  Intake/Output from previous day: 11/09 0701 - 11/10 0700 In: 2577 [P.O.:400; I.V.:2177] Out: 3630 [Urine:3550; Drains:80] Intake/Output this shift:    No results found for this basename: HGB:5 in the last 72 hours No results found for this basename: WBC:2,RBC:2,HCT:2,PLT:2 in the last 72 hours No results found for this basename: NA:2,K:2,CL:2,CO2:2,BUN:2,CREATININE:2,GLUCOSE:2,CALCIUM:2 in the last 72 hours No results found for this basename: LABPT:2,INR:2 in the last 72 hours  Neurologically intact Dorsiflexion/Plantar flexion intact Discharge today Assessment/Plan: 3 Days Post-Op Procedure(s) (LRB): HARDWARE REMOVAL (Left)   Sheri Hood P 01/11/2011, 7:21 AM

## 2011-01-11 NOTE — Progress Notes (Signed)
Cm spoke with Pt concerning d/c planning. Pt has DME. Pt set up with [facility] for HHPT. Awaiting MD order.

## 2011-01-11 NOTE — Progress Notes (Signed)
Discussed with pt and spouse safety on stairs. Recommend that pt use WC to get into house due to difficulty that pt has hopping, and is NWB.  Rec. Pt look into a knee walker. Pt ready to DC home. Pt has beeb through surgery before and feels trying Steps is too difficult.

## 2011-01-15 NOTE — Discharge Summary (Signed)
Dictation performed on 01/15/11

## 2011-01-15 NOTE — Discharge Summary (Signed)
[name], Hood NO.:  [id number]  MEDICAL RECORD NO.:  [id number]  LOCATION:  1614                         FACILITY:  [facility]  PHYSICIAN:  Marlowe Kays, M.D.  DATE OF BIRTH:  June 17, 1965  DATE OF ADMISSION:  01/08/2011 DATE OF DISCHARGE:  01/11/2011                              DISCHARGE SUMMARY   SUMMARY:  [name] was admitted on November 7 for 2 surgical procedures regarding her left ankle for pain.  Several years ago, she had ORIF of bimalleolar fracture, left ankle with 2 screws in her medial malleolus and plate and screws in her distal fibula.  She also has had chronic posterior ankle pain.  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  After full discussion that this was the most likely cause for her posterior ankle pain, but that is the hardware which is tender over it on examination could also be contributing to her pain pattern.  Significantly, she had minimal arthritic changes in the talonavicular and calcaneocuboid joints, so I felt that a subtalar fusion alone rather than triple arthrodesis would be the treatment of choice because of a partial tarsal coalition.  HOSPITAL COURSE: 1. Operation was performed on November 7 with;     a.     Removal of 2 screws from medial malleolus.     b.     Removal of plate and screws from distal fibula.     c.     Subtalar arthrodesis with allograft bone. 2. I was assisted by Dr. Ranee Gosselin, whose assistance was required     for retraction and technical help. Postoperative course was complicated by a good bit of nausea.  She was known to be allergic to codeine, but was unable to tolerate the PCA Dilaudid.  Once I switched her to Demerol, she did fine and at time of discharge, was ambulatory with crutches, having been to  physical therapy with [facility] weight[facility] only on her left leg.  She was given a prescription for Demerol 50 mg, #25.  She was given instructions to keep her dressing dry and to return to see me in my office roughly 2 weeks from surgery.  CONDITION AT DISCHARGE:  Stable.          ______________________________ Marlowe Kays, M.D.     JA/MEDQ  D:  01/15/2011  T:  01/15/2011  Job:  [id number]

## 2011-01-21 ENCOUNTER — Encounter (HOSPITAL_COMMUNITY): Payer: Self-pay | Admitting: Orthopedic Surgery

## 2011-04-23 ENCOUNTER — Other Ambulatory Visit: Payer: Self-pay | Admitting: Orthopedic Surgery

## 2011-04-23 DIAGNOSIS — M25579 Pain in unspecified ankle and joints of unspecified foot: Secondary | ICD-10-CM

## 2011-05-02 ENCOUNTER — Ambulatory Visit
Admission: RE | Admit: 2011-05-02 | Discharge: 2011-05-02 | Disposition: A | Discharge status: Home / Self Care | Payer: BC Managed Care – PPO | Source: Ambulatory Visit | Attending: Orthopedic Surgery | Admitting: Orthopedic Surgery

## 2011-05-02 DIAGNOSIS — M25579 Pain in unspecified ankle and joints of unspecified foot: Secondary | ICD-10-CM

## 2011-05-21 ENCOUNTER — Other Ambulatory Visit: Payer: Self-pay | Admitting: Orthopedic Surgery

## 2011-05-21 DIAGNOSIS — M25579 Pain in unspecified ankle and joints of unspecified foot: Secondary | ICD-10-CM

## 2011-05-25 ENCOUNTER — Ambulatory Visit
Admission: RE | Admit: 2011-05-25 | Discharge: 2011-05-25 | Disposition: A | Discharge status: Home / Self Care | Payer: BC Managed Care – PPO | Source: Ambulatory Visit | Attending: Orthopedic Surgery | Admitting: Orthopedic Surgery

## 2011-05-25 DIAGNOSIS — M25579 Pain in unspecified ankle and joints of unspecified foot: Secondary | ICD-10-CM

## 2011-08-26 ENCOUNTER — Ambulatory Visit: Payer: Self-pay | Admitting: Unknown Physician Specialty

## 2011-12-31 ENCOUNTER — Encounter: Payer: Self-pay | Admitting: Family Medicine

## 2011-12-31 ENCOUNTER — Ambulatory Visit (INDEPENDENT_AMBULATORY_CARE_PROVIDER_SITE_OTHER): Payer: BC Managed Care – PPO | Admitting: Family Medicine

## 2011-12-31 VITALS — BP 120/80 | HR 68 | Temp 98.2°F | Ht 63.25 in | Wt 211.0 lb

## 2011-12-31 DIAGNOSIS — R739 Hyperglycemia, unspecified: Secondary | ICD-10-CM

## 2011-12-31 DIAGNOSIS — R7309 Other abnormal glucose: Secondary | ICD-10-CM

## 2011-12-31 DIAGNOSIS — Z01419 Encounter for gynecological examination (general) (routine) without abnormal findings: Secondary | ICD-10-CM | POA: Insufficient documentation

## 2011-12-31 DIAGNOSIS — J069 Acute upper respiratory infection, unspecified: Secondary | ICD-10-CM

## 2011-12-31 DIAGNOSIS — Z0000 Encounter for general adult medical examination without abnormal findings: Secondary | ICD-10-CM

## 2011-12-31 DIAGNOSIS — Z136 Encounter for screening for cardiovascular disorders: Secondary | ICD-10-CM

## 2011-12-31 MED ORDER — CEFUROXIME AXETIL 250 MG PO TABS: 250.0000 mg | ORAL_TABLET | Freq: Two times a day (BID) | ORAL | Status: DC

## 2011-12-31 NOTE — Progress Notes (Signed)
Subjective:    Patient ID: Sheri Hood, female    DOB: [birth year], 46 y.o.   MRN: [id number]  HPI  46 yo here to establish care and for CPX.  G4P3.  States she is due for pap smear. No h/o abnormal pap smears. Remote h/o ablation for heavy bleeding.  No recurrent symptoms.  Mammogram done per pt in August 2013.  Had ankle surgery several months ago and CBGs were elevated during hospitalization.  She would like her blood sugar checked today. Does have family h/o DM. Denies any increased thirst or urination.  URI-  Sore throat, laryngitis for past 8 days.  Coughing up green sputum.  No fevers, chills or SOB.  Sinus pressure is worsening.  Patient Active Problem List  Diagnosis  . Routine general medical examination at a health care facility  . Routine gynecological examination  . URI (upper respiratory infection)   Past Medical History  Diagnosis Date  . Hypertension   . Heart murmur     recent diagnosed- / ehho 12/30/10 report on chart  . Headache     h/x migraines  . Asthma   . Recurrent upper respiratory infection (URI)     occ sore throat and slightly stuffy- no fever or cough  . Hiatal hernia   . Arthritis     arhtritis middle back, scolosis  . Anemia     s/p ablation for heavy bleeding    Past Surgical History  Procedure Date  . Cholecystectomy   . Tonsillectomy   . Ablation colpoclesis   . Fracture surgery   . Hardware removal 01/08/2011    Procedure: HARDWARE REMOVAL;  Surgeon: Illene Labrador A[place];  Location: [facility];  Service: Orthopedics;  Laterality: Left;  screw and plate left ankle and Fusion of Subtalor Joint Left Ankle    (Large c-arm)    History  Substance Use Topics  . Smoking status: Former Smoker -- 0.5 packs/day for 4 years    Types: Cigarettes    Quit date: 01/02/1988  . Smokeless tobacco: Not on file  . Alcohol Use: Yes   No family history on file. Allergies  Allergen Reactions  . Benadryl (Diphenhydramine Hcl) Other (See Comments)   Keeps awake  . Codeine Other (See Comments)    Keeps awake  . Dilaudid (Hydromorphone Hcl) Itching and Nausea Only  . Morphine And Related Itching and Nausea Only   Current Outpatient Prescriptions on File Prior to Visit  Medication Sig Dispense Refill  . citalopram (CELEXA) 40 MG tablet Take 40 mg by mouth Nightly.       . hydrochlorothiazide (HYDRODIURIL) 25 MG tablet Take 25 mg by mouth every morning.       Marland Kitchen ibuprofen (ADVIL,MOTRIN) 200 MG tablet Take 200 mg by mouth every 6 (six) hours as needed. For pain      . omeprazole (PRILOSEC OTC) 20 MG tablet Take 20 mg by mouth 2 (two) times daily.       . vitamin E 400 UNIT capsule Take 800 Units by mouth at bedtime.        The PMH, PSH, Social History, Family History, Medications, and allergies have been reviewed in [facility], and have been updated if relevant.   Review of Systems See HPI Patient reports no  vision/ hearing changes,anorexia, weight change, fever ,adenopathy, persistant / recurrent hoarseness, swallowing issues, chest pain, edema,persistant / recurrent cough, hemoptysis, dyspnea(rest, exertional, paroxysmal nocturnal), gastrointestinal  bleeding (melena, rectal bleeding), abdominal pain, excessive heart burn, GU symptoms(dysuria, hematuria, pyuria, voiding/incontinence  Issues) syncope, focal weakness, severe memory loss, concerning skin lesions, depression, anxiety, abnormal bruising/bleeding, major joint swelling, breast masses or abnormal vaginal bleeding.       Objective:   Physical Exam BP 120/80  Pulse 68  Temp 98.2 F (36.8 C)  Ht 5' 3.25" (1.607 m)  Wt 211 lb (95.709 kg)  BMI 37.08 kg/m2  General:  Well-developed,well-nourished,in no acute distress; alert,appropriate and cooperative throughout examination Head:  normocephalic and atraumatic.   Eyes:  vision grossly intact, pupils equal, pupils round, and pupils reactive to light.   Ears:  R ear normal and L ear normal.   Nose:  no external deformity.   +boggy  turbinates, frontal sinuses TTP. Mouth:  good dentition.   +PND Neck:  No deformities, masses, or tenderness noted. Breasts:  No mass, nodules, thickening, tenderness, bulging, retraction, inflamation, nipple discharge or skin changes noted.   Lungs:  Normal respiratory effort, chest expands symmetrically. Lungs are clear to auscultation, no crackles or wheezes. Heart:  Normal rate and regular rhythm. S1 and S2 normal without gallop, murmur, click, rub or other extra sounds. Abdomen:  Bowel sounds positive,abdomen soft and non-tender without masses, organomegaly or hernias noted. Rectal:  no external abnormalities.   Genitalia:  Pelvic Exam:        External: normal female genitalia without lesions or masses        Vagina: normal without lesions or masses        Cervix: normal without lesions or masses        Adnexa: normal bimanual exam without masses or fullness        Uterus: normal by palpation        Pap smear: performed Msk:  No deformity or scoliosis noted of thoracic or lumbar spine.   Extremities:  No clubbing, cyanosis, edema, or deformity noted with normal full range of motion of all joints.   Neurologic:  alert & oriented X3 and gait normal.   Skin:  Intact without suspicious lesions or rashes Cervical Nodes:  No lymphadenopathy noted Axillary Nodes:  No palpable lymphadenopathy Psych:  Cognition and judgment appear intact. Alert and cooperative with normal attention span and concentration. No apparent delusions, illusions, hallucinations     Assessment & Plan:   1. Routine general medical examination at a health care facility  Reviewed preventive care protocols, scheduled due services, and updated immunizations Discussed nutrition, exercise, diet, and healthy lifestyle.  Comprehensive metabolic panel, Cytology - PAP  2. Routine gynecological examination  Cytology - PAP  3. Elevated blood sugar  With family h/o DM- check a1c. Hemoglobin A1c  4. Screening for ischemic  heart disease  Lipid Panel  5. URI (upper respiratory infection)  Given duration and progression of symptoms, will treat for bacterial sinusitis with Ceftin. Supportive care as per pt instructions.

## 2011-12-31 NOTE — Patient Instructions (Addendum)
Nice to see you, Sheri Hood.  We will call you with your lab results and send a letter with your pap smear results (unless it is abnormal).  Please take Ceftin as directed- 1 tablet twice daily for 10 days.  Drink lots of fluids.  Treat sympotmatically with Mucinex, nasal saline irrigation, and Tylenol/Ibuprofen.Call if not improving as expected in 5-7 days.

## 2012-01-01 LAB — COMPREHENSIVE METABOLIC PANEL
ALT: 33 IU/L — ABNORMAL HIGH (ref 0–32)
Albumin/Globulin Ratio: 2 (ref 1.1–2.5)
Albumin: 4.9 g/dL (ref 3.5–5.5)
BUN: 12 mg/dL (ref 6–24)
Calcium: 9.5 mg/dL (ref 8.7–10.2)
Creatinine, Ser: 0.72 mg/dL (ref 0.57–1.00)
GFR calc Af Amer: 118 mL/min/{1.73_m2} (ref >59)
GFR calc non Af Amer: 102 mL/min/{1.73_m2} (ref >59)
Glucose: 100 mg/dL — ABNORMAL HIGH (ref 65–99)
Potassium: 3.5 mmol/L (ref 3.5–5.2)
Total Bilirubin: 0.4 mg/dL (ref 0.0–1.2)
Total Protein: 7.4 g/dL (ref 6.0–8.5)

## 2012-01-01 LAB — LIPID PANEL
Cholesterol, Total: 201 mg/dL — ABNORMAL HIGH (ref 100–199)
HDL: 41 mg/dL (ref >39)
LDL Calculated: 107 mg/dL — ABNORMAL HIGH (ref 0–99)
Triglycerides: 267 mg/dL — ABNORMAL HIGH (ref 0–149)

## 2012-01-07 ENCOUNTER — Encounter: Payer: Self-pay | Admitting: Family Medicine

## 2012-02-17 ENCOUNTER — Ambulatory Visit (INDEPENDENT_AMBULATORY_CARE_PROVIDER_SITE_OTHER): Payer: BC Managed Care – PPO | Admitting: Family Medicine

## 2012-02-17 ENCOUNTER — Encounter: Payer: Self-pay | Admitting: Family Medicine

## 2012-02-17 VITALS — BP 120/74 | HR 88 | Temp 99.1°F | Wt 214.0 lb

## 2012-02-17 DIAGNOSIS — J069 Acute upper respiratory infection, unspecified: Secondary | ICD-10-CM

## 2012-02-17 MED ORDER — ALBUTEROL SULFATE HFA 108 (90 BASE) MCG/ACT IN AERS: 2.0000 | INHALATION_SPRAY | Freq: Four times a day (QID) | RESPIRATORY_TRACT | Status: DC | PRN

## 2012-02-17 MED ORDER — CEFUROXIME AXETIL 250 MG PO TABS: 250.0000 mg | ORAL_TABLET | Freq: Two times a day (BID) | ORAL | Status: DC

## 2012-02-17 NOTE — Patient Instructions (Addendum)
Take Ceftin as directed- 1 tablet twice daily x 10 days.  Drink lots of fluids.  Treat sympotmatically with Mucinex, nasal saline irrigation, and Tylenol/Ibuprofen. Also try claritin D or zyrtec D over the counter- two times a day as needed ( have to sign for them at pharmacy). You can use warm compresses.  Cough suppressant at night. Call if not improving as expected in 5-7 days.

## 2012-02-17 NOTE — Progress Notes (Signed)
SUBJECTIVE:  Sheri Hood is a 46 y.o. female who complains of cough, congestion, sinus pain, subjective fever.. She denies a history of anorexia, chills, shortness of breath, sweats and vomiting and denies a history of asthma. Patient denies smoke cigarettes.   Patient Active Problem List  Diagnosis  . Routine general medical examination at a health care facility  . Routine gynecological examination  . URI (upper respiratory infection)   Past Medical History  Diagnosis Date  . Hypertension   . Heart murmur     recent diagnosed- / ehho 12/30/10 report on chart  . Headache     h/x migraines  . Asthma   . Recurrent upper respiratory infection (URI)     occ sore throat and slightly stuffy- no fever or cough  . Hiatal hernia   . Arthritis     arhtritis middle back, scolosis  . Anemia     s/p ablation for heavy bleeding    Past Surgical History  Procedure Date  . Cholecystectomy   . Tonsillectomy   . Ablation colpoclesis   . Fracture surgery   . Hardware removal 01/08/2011    Procedure: HARDWARE REMOVAL;  Surgeon: Illene Labrador Aplington;  Location: [facility];  Service: Orthopedics;  Laterality: Left;  screw and plate left ankle and Fusion of Subtalor Joint Left Ankle    (Large c-arm)    History  Substance Use Topics  . Smoking status: Former Smoker -- 0.5 packs/day for 4 years    Types: Cigarettes    Quit date: 01/02/1988  . Smokeless tobacco: Never Used  . Alcohol Use: Yes   No family history on file. Allergies  Allergen Reactions  . Benadryl (Diphenhydramine Hcl) Other (See Comments)    Keeps awake  . Codeine Other (See Comments)    Keeps awake  . Dilaudid (Hydromorphone Hcl) Itching and Nausea Only  . Morphine And Related Itching and Nausea Only   Current Outpatient Prescriptions on File Prior to Visit  Medication Sig Dispense Refill  . citalopram (CELEXA) 40 MG tablet Take 40 mg by mouth Nightly.       . hydrochlorothiazide (HYDRODIURIL) 25 MG tablet Take 25 mg by mouth  every morning.       Marland Kitchen ibuprofen (ADVIL,MOTRIN) 200 MG tablet Take 200 mg by mouth every 6 (six) hours as needed. For pain      . omeprazole (PRILOSEC OTC) 20 MG tablet Take 20 mg by mouth 2 (two) times daily.       . vitamin E 400 UNIT capsule Take 800 Units by mouth at bedtime.       Marland Kitchen albuterol (PROVENTIL HFA;VENTOLIN HFA) 108 (90 BASE) MCG/ACT inhaler Inhale 2 puffs into the lungs every 6 (six) hours as needed for wheezing.  1 Inhaler  0   The PMH, PSH, Social History, Family History, Medications, and allergies have been reviewed in [facility], and have been updated if relevant.   OBJECTIVE: BP 120/74  Pulse 88  Temp 99.1 F (37.3 C) (Oral)  Wt 214 lb (97.07 kg)  SpO2 97%  She appears well, vital signs are as noted. Ears normal.  Throat and pharynx normal.  Neck supple. No adenopathy in the neck. Nose is congested. Sinuses non tender. The chest is clear, without wheezes or rales.  ASSESSMENT:  sinusitis  PLAN: Given duration and progression of symptoms, will treat for bacterial sinusitis.  Symptomatic therapy suggested: push fluids, rest and return office visit prn if symptoms persist or worsen. Call or return to clinic prn  if these symptoms worsen or fail to improve as anticipated.

## 2012-03-01 ENCOUNTER — Telehealth: Payer: Self-pay | Admitting: Family Medicine

## 2012-03-01 NOTE — Telephone Encounter (Signed)
[facility] Triage Call Report Triage Record Num: [id number] Operator: Celesta Aver Patient Name: Sheri Hood Call Date & Time: 02/28/2012 6:17:45PM Patient Phone: [phone] PCP: Ruthe Mannan Patient Gender: Female PCP Fax : [phone] Patient DOB: Mar 07, 1965 Practice Name: [facility] Reason for Call: Caller: [name]; PCP: Ruthe Mannan (Family Practice); CB#: [phone]; Call regarding Cough/Congestion; Afebrile. LMP- Ablation. Denies presgnacy and lactation. Finished antibiotic on 02/26/12 for Walking Pneumonia. Calling about cough and congestion. Coughing up mucus that is clear in color. Caller as been able to go to work. Cough is intermittent. All emergent sxs per Cough protocol r/o with exception to 'Productive cough and clear or white sputum.' Home care advice given. Protocol(s) Used: Cough - Adult Recommended Outcome per Protocol: Provide Home/Self Care Reason for Outcome: Productive cough AND clear or white sputum Care Advice: ~ Use a cool mist humidifier to moisten air. Be sure to clean according to manufacturer's instructions. An annual influenza vaccination is recommended for all adults 56 years of age or older, those with chronic illness, or in contact with high risk individuals. An immunization for pneumonia is also recommended. The frail elderly may require a second pneumonia immunization 3 to 5 years after the first dose. ~ ~ Call provider for evaluation of cough that lasts 2 weeks or more. ~ HEALTH PROMOTION / MAINTENANCE ~ SYMPTOM / CONDITION MANAGEMENT ~ CAUTIONS Coughing up mucus or phlegm helps to get rid of an infection. A productive cough should not be stopped. A cough medicine with guaifenesin (Robitussin, Mucinex) can help loosen the mucus. Cough medicine with dextromethorphan (DM) should be avoided. Drinking lots of fluids can help loosen the mucus too, especially warm fluids. ~ Most adults need to drink 6-10 eight-ounce glasses  (1.2-2.0 liters) of fluids per day unless previously told to limit fluid intake for other medical reasons. Limit fluids that contain caffeine, sugar or alcohol. Urine will be a very light yellow color when you drink enough fluids. ~ If not contraindicated, consider nonprescription non-sedating antihistamine for symptom relief as directed on label or by pharmacist. Other antihistamine medications may cause drowsiness or confusion, especially in elderly or chronically ill patients and should be taken with caution. ~ Do not take nonprescription decongestants (such as Actifed, Sinutab, Sudafed) if there is a history of hypertension, unless specifically directed by provider. ~ 02/28/2012 6:35:54PM Page 1 of 1 [id number]

## 2012-04-08 ENCOUNTER — Other Ambulatory Visit: Payer: Self-pay | Admitting: *Deleted

## 2012-04-08 MED ORDER — CITALOPRAM HYDROBROMIDE 40 MG PO TABS: 40.0000 mg | ORAL_TABLET | Freq: Every day | ORAL | Status: DC

## 2012-06-25 ENCOUNTER — Telehealth: Payer: Self-pay | Admitting: Family Medicine

## 2012-06-25 NOTE — Telephone Encounter (Signed)
Order written and in my box. 

## 2012-06-25 NOTE — Telephone Encounter (Signed)
Patient Information:  Caller Name: Brittanny  Phone: [phone]  Patient: Sheri, Hood  Gender: Female  DOB: [birth year]  Age: 47 Years  PCP: Ruthe Mannan [facility])  Pregnant: No  Office Follow Up:  Does the office need to follow up with this patient?: Yes  Instructions For The Office: OFFICE PLEASE FOLLOW UP WITH PT REQUEST FOR A UA/CULTURE ORDER.  RN Note:  pt is requesting order to be faxed to her [phone].  (pt states she is trying to save time since lab will be sent to [facility] anyway because she gets the labs run for free)  Symptoms  Reason For Call & Symptoms: pt reports that she has UTI symptoms.  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  Pt has started taking some of daughters antibiotic for her symptoms but it is not helping  Reviewed Health History In EMR: Yes  Reviewed Medications In EMR: Yes  Reviewed Allergies In EMR: Yes  Reviewed Surgeries / Procedures: Yes  Date of Onset of Symptoms: 06/16/2012  Treatments Tried: cranberry pill, Sulfa antibiotic  Treatments Tried Worked: No OB / GYN:  LMP: Unknown  Guideline(s) Used:  Urination Pain - Female  Disposition Per Guideline:   See Today in Office  Reason For Disposition Reached:   Painful urination AND EITHER frequency or urgency  Advice Given:  Call Back If:  You become worse.  Patient Refused Recommendation:  Patient Refused Care Advice  pt is requesting an order from Dr Dayton Martes so that pt can have UA sample done with culture at [facility]

## 2012-06-25 NOTE — Telephone Encounter (Signed)
Order faxed to [facility], advised patient.

## 2012-06-28 ENCOUNTER — Encounter: Payer: Self-pay | Admitting: Family Medicine

## 2012-07-05 ENCOUNTER — Ambulatory Visit (INDEPENDENT_AMBULATORY_CARE_PROVIDER_SITE_OTHER): Payer: BC Managed Care – PPO | Admitting: Family Medicine

## 2012-07-05 ENCOUNTER — Encounter: Payer: Self-pay | Admitting: Family Medicine

## 2012-07-05 VITALS — BP 120/70 | HR 84 | Temp 98.1°F | Wt 216.0 lb

## 2012-07-05 DIAGNOSIS — R102 Pelvic and perineal pain: Secondary | ICD-10-CM | POA: Insufficient documentation

## 2012-07-05 DIAGNOSIS — R109 Unspecified abdominal pain: Secondary | ICD-10-CM

## 2012-07-05 DIAGNOSIS — N200 Calculus of kidney: Secondary | ICD-10-CM

## 2012-07-05 DIAGNOSIS — R1030 Lower abdominal pain, unspecified: Secondary | ICD-10-CM

## 2012-07-05 LAB — POCT URINALYSIS DIPSTICK
Bilirubin, UA: NEGATIVE
Blood, UA: NEGATIVE
Glucose, UA: NEGATIVE
Ketones, UA: NEGATIVE
Leukocytes, UA: NEGATIVE
Nitrite, UA: NEGATIVE
Protein, UA: NEGATIVE
Spec Grav, UA: 1.02
Urobilinogen, UA: NEGATIVE
pH, UA: 6

## 2012-07-05 MED ORDER — CITALOPRAM HYDROBROMIDE 40 MG PO TABS: 40.0000 mg | ORAL_TABLET | Freq: Every day | ORAL | Status: DC

## 2012-07-05 NOTE — Progress Notes (Signed)
Subjective:    Patient ID: Sheri Hood, female    DOB: [birth year], 47 y.o.   MRN: [id number]  HPI  47 yo with h/o recurrent nephrolithiasis here to discuss intermittent suprapubic pressure.  She last saw Dr. Achilles Dunk approximately a year ago, per pt.  Was told she had multiple stones but they were small.  She thinks at one point she passed one.  Since then, intermittent episodes of suprapubic pressure without dysuria.  She called office last week with this complaint and asked if she could send a urine cx at work.  We sent order and urine cx was neg on 06/25/12.  Has never been on flomax.  Denies any back pain, nausea, vomiting or fevers. No frank hematuria.  Patient Active Problem List   Diagnosis Date Noted  . Recurrent nephrolithiasis 07/05/2012   Past Medical History  Diagnosis Date  . Hypertension   . Heart murmur     recent diagnosed- / ehho 12/30/10 report on chart  . Headache     h/x migraines  . Asthma   . Recurrent upper respiratory infection (URI)     occ sore throat and slightly stuffy- no fever or cough  . Hiatal hernia   . Arthritis     arhtritis middle back, scolosis  . Anemia     s/p ablation for heavy bleeding    Past Surgical History  Procedure Laterality Date  . Cholecystectomy    . Tonsillectomy    . Ablation colpoclesis    . Fracture surgery    . Hardware removal  01/08/2011    Procedure: HARDWARE REMOVAL;  Surgeon: Illene Labrador Aplington;  Location: [facility];  Service: Orthopedics;  Laterality: Left;  screw and plate left ankle and Fusion of Subtalor Joint Left Ankle    (Large c-arm)    History  Substance Use Topics  . Smoking status: Former Smoker -- 0.50 packs/day for 4 years    Types: Cigarettes    Quit date: 01/02/1988  . Smokeless tobacco: Never Used  . Alcohol Use: Yes   No family history on file. Allergies  Allergen Reactions  . Benadryl (Diphenhydramine Hcl) Other (See Comments)    Keeps awake  . Codeine Other (See Comments)    Keeps  awake  . Dilaudid (Hydromorphone Hcl) Itching and Nausea Only  . Morphine And Related Itching and Nausea Only   Current Outpatient Prescriptions on File Prior to Visit  Medication Sig Dispense Refill  . albuterol (PROVENTIL HFA;VENTOLIN HFA) 108 (90 BASE) MCG/ACT inhaler Inhale 2 puffs into the lungs every 6 (six) hours as needed for wheezing.  1 Inhaler  0  . cefUROXime (CEFTIN) 250 MG tablet Take 1 tablet (250 mg total) by mouth 2 (two) times daily.  20 tablet  0  . citalopram (CELEXA) 40 MG tablet Take 1 tablet (40 mg total) by mouth at bedtime.  90 tablet  0  . hydrochlorothiazide (HYDRODIURIL) 25 MG tablet Take 25 mg by mouth every morning.       Marland Kitchen ibuprofen (ADVIL,MOTRIN) 200 MG tablet Take 200 mg by mouth every 6 (six) hours as needed. For pain      . omeprazole (PRILOSEC OTC) 20 MG tablet Take 20 mg by mouth 2 (two) times daily.       . Sodium & Potassium Bicarbonate (ALKA-SELTZER GOLD PO) Take by mouth as needed.      . vitamin E 400 UNIT capsule Take 800 Units by mouth at bedtime.        No  current facility-administered medications on file prior to visit.   The PMH, PSH, Social History, Family History, Medications, and allergies have been reviewed in [facility], and have been updated if relevant.   Review of Systems See HPI    Objective:   Physical Exam BP 120/70  Pulse 84  Temp(Src) 98.1 F (36.7 C)  Wt 216 lb (97.977 kg)  BMI 37.94 kg/m2  General:  Well-developed,well-nourished,in no acute distress; alert,appropriate and cooperative throughout examination Head:  normocephalic and atraumatic.   Lungs:  Normal respiratory effort, chest expands symmetrically. Lungs are clear to auscultation, no crackles or wheezes. Heart:  Normal rate and regular rhythm. S1 and S2 normal without gallop, murmur, click, rub or other extra sounds. Abdomen:  Bowel sounds positive,abdomen soft and non-tender No CVA or suprapubic tenderness Msk:  No deformity or scoliosis noted of thoracic or lumbar  spine.   Extremities:  No clubbing, cyanosis, edema, or deformity noted with normal full range of motion of all joints.   Neurologic:  alert & oriented X3 and gait normal.   Skin:  Intact without suspicious lesions or rashes Psych:  Cognition and judgment appear intact. Alert and cooperative with normal attention span and concentration. No apparent delusions, illusions, hallucinations    Assessment & Plan:  1. Recurrent nephrolithiasis  UA neg today. Advised getting KUB but she would like to discuss with Dr. Achilles Dunk.  2. Suprapubic pressure, unspecified laterality  See above.

## 2012-07-05 NOTE — Addendum Note (Signed)
Addended by: Consuello Masse on: 07/05/2012 08:37 AM   Modules accepted: Orders

## 2012-08-31 ENCOUNTER — Ambulatory Visit: Payer: Self-pay | Admitting: Family Medicine

## 2012-09-01 ENCOUNTER — Encounter: Payer: Self-pay | Admitting: *Deleted

## 2012-09-01 ENCOUNTER — Encounter: Payer: Self-pay | Admitting: Family Medicine

## 2012-09-01 LAB — HM MAMMOGRAPHY: HM Mammogram: NORMAL

## 2012-09-06 ENCOUNTER — Telehealth: Payer: Self-pay | Admitting: *Deleted

## 2012-09-06 MED ORDER — HYDROCHLOROTHIAZIDE 25 MG PO TABS: 25.0000 mg | ORAL_TABLET | ORAL | Status: DC

## 2012-09-06 NOTE — Telephone Encounter (Signed)
Rx refill

## 2012-10-19 ENCOUNTER — Telehealth: Payer: Self-pay | Admitting: *Deleted

## 2012-10-19 NOTE — Telephone Encounter (Signed)
Message copied by Eliezer Bottom on Tue Oct 19, 2012 11:15 AM ------      Message from: Dianne Dun      Created: Tue Oct 19, 2012  9:46 AM      Regarding: FW: cpe lab orders needed       rx written and in my box.      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            Thank you, Bonita Quin T.                   ------

## 2012-10-19 NOTE — Telephone Encounter (Signed)
Order mailed to pts home address.

## 2012-11-22 ENCOUNTER — Telehealth: Payer: Self-pay | Admitting: *Deleted

## 2012-11-22 NOTE — Telephone Encounter (Signed)
Form signed and in my box. 

## 2012-11-22 NOTE — Telephone Encounter (Signed)
Spoke to patient. She had a disability place card that lasted for 5 years and has expired. B/L ankle and feet fracture with 2 surgeries and needs another surgery.

## 2012-11-22 NOTE — Telephone Encounter (Signed)
Patient notified. Form ready for pick up.

## 2012-11-22 NOTE — Telephone Encounter (Signed)
What does she needs this for °

## 2012-11-22 NOTE — Telephone Encounter (Signed)
Patient dropped off permanent disability parking place card form. Call patient when ready to be picked up. Form is on your desk.

## 2012-12-22 ENCOUNTER — Other Ambulatory Visit: Payer: Self-pay | Admitting: *Deleted

## 2012-12-22 MED ORDER — HYDROCHLOROTHIAZIDE 25 MG PO TABS: 25.0000 mg | ORAL_TABLET | ORAL | Status: DC

## 2012-12-22 NOTE — Telephone Encounter (Signed)
CPX scheduled for 01/07/2013 with Nicki Reaper.

## 2013-01-03 ENCOUNTER — Encounter: Payer: BC Managed Care – PPO | Admitting: Family Medicine

## 2013-01-07 ENCOUNTER — Ambulatory Visit (INDEPENDENT_AMBULATORY_CARE_PROVIDER_SITE_OTHER): Payer: BC Managed Care – PPO | Admitting: Internal Medicine

## 2013-01-07 ENCOUNTER — Encounter: Payer: Self-pay | Admitting: Internal Medicine

## 2013-01-07 VITALS — BP 132/80 | HR 73 | Temp 98.3°F | Ht 62.0 in | Wt 216.0 lb

## 2013-01-07 DIAGNOSIS — Z23 Encounter for immunization: Secondary | ICD-10-CM

## 2013-01-07 DIAGNOSIS — Z0000 Encounter for general adult medical examination without abnormal findings: Secondary | ICD-10-CM

## 2013-01-07 NOTE — Patient Instructions (Signed)

## 2013-01-07 NOTE — Progress Notes (Signed)
HPI: Pt presents to office today for yearly wellness exam. Pt endorses concerns regarding right ankle pain. The patient broke left ankle over a year ago and has multiple surgeries, during the fall she also injured her right ankle and now has a palpable cyst. Pt states she has pain everyday, in which she takes Ibuprofen. She was given a brace by ortho, however does not wear it as directed. Pt states this pain does effect her daily life. Pt denies exercising and does endorse she not eat a well balanced diet.  LMP: 10/2012 Pap smear:01/2012 Mammogram:01/2013 Flu vaccine:01/2013 Tdap: unknown; received today Eye exam: 08/2012 Dentist:08/2011   Past Medical History  Diagnosis Date  . Hypertension   . Heart murmur     recent diagnosed- / ehho 12/30/10 report on chart  . Headache(784.0)     h/x migraines  . Asthma   . Recurrent upper respiratory infection (URI)     occ sore throat and slightly stuffy- no fever or cough  . Hiatal hernia   . Arthritis     arhtritis middle back, scolosis  . Anemia     s/p ablation for heavy bleeding     Current Outpatient Prescriptions  Medication Sig Dispense Refill  . albuterol (PROVENTIL HFA;VENTOLIN HFA) 108 (90 BASE) MCG/ACT inhaler Inhale 2 puffs into the lungs every 6 (six) hours as needed for wheezing.  1 Inhaler  0  . citalopram (CELEXA) 40 MG tablet Take 1 tablet (40 mg total) by mouth at bedtime.  90 tablet  3  . hydrochlorothiazide (HYDRODIURIL) 25 MG tablet Take 1 tablet (25 mg total) by mouth every morning.  90 tablet  0  . ibuprofen (ADVIL,MOTRIN) 200 MG tablet Take 200 mg by mouth every 6 (six) hours as needed. For pain      . omeprazole (PRILOSEC OTC) 20 MG tablet Take 20 mg by mouth 2 (two) times daily.       . vitamin E 400 UNIT capsule Take 800 Units by mouth at bedtime.        No current facility-administered medications for this visit.    Allergies  Allergen Reactions  . Benadryl [Diphenhydramine Hcl] Other (See Comments)   Keeps awake  . Codeine Other (See Comments)    Keeps awake  . Dilaudid [Hydromorphone Hcl] Itching and Nausea Only  . Morphine And Related Itching and Nausea Only    History reviewed. No pertinent family history.  History   Social History  . Marital Status: Married    Spouse Name: N/A    Number of Children: N/A  . Years of Education: N/A   Occupational History  . Not on file.   Social History Main Topics  . Smoking status: Former Smoker -- 0.50 packs/day for 4 years    Types: Cigarettes    Quit date: 01/02/1988  . Smokeless tobacco: Never Used  . Alcohol Use: Yes  . Drug Use: No  . Sexual Activity: Not on file   Other Topics Concern  . Not on file   Social History Narrative  . No narrative on file    ROS:  Constitutional: Denies fever, malaise, fatigue, headache or abrupt weight changes.  HEENT: Denies eye pain, eye redness, ear pain, ringing in the ears, wax buildup, runny nose, nasal congestion, bloody nose, or sore throat. Respiratory: Denies difficulty breathing, shortness of breath, cough or sputum production.   Cardiovascular: Denies chest pain, chest tightness, palpitations or swelling in the hands or feet.  Gastrointestinal: Denies abdominal pain, bloating, constipation,  diarrhea or blood in the stool.  GU: Denies frequency, urgency, pain with urination, blood in urine, odor or discharge. Breast: Denies pain, tenderness, lumps, swelling, or nipple discharge. GYN: Denies vaginal itching, pain, discharge, or odor Musculoskeletal: Denies decrease in range of motion, difficulty with gait, muscle pain or joint pain and swelling.  Skin: Denies redness, rashes, lesions or ulcercations.  Neurological: Denies dizziness, difficulty with memory, difficulty with speech or problems with balance and coordination.   No other specific complaints in a complete review of systems (except as listed in HPI above).  PE:  BP 132/80  Pulse 73  Temp(Src) 98.3 F (36.8 C)  (Oral)  Ht 5\' 2"  (1.575 m)  Wt 216 lb (97.977 kg)  BMI 39.50 kg/m2  SpO2 97% Wt Readings from Last 3 Encounters:  01/07/13 216 lb (97.977 kg)  07/05/12 216 lb (97.977 kg)  02/17/12 214 lb (97.07 kg)    General: Appears their stated age, well developed, well nourished in NAD. HEENT: Head: normal shape and size; Eyes: sclera white, no icterus, conjunctiva pink, PERRLA and EOMs intact; Ears: Tm's gray and intact, normal light reflex; Nose: mucosa pink and moist, septum midline; Throat/Mouth: Teeth present, mucosa pink and moist, no lesions or ulcerations noted.  Neck: Normal range of motion. Neck supple, trachea midline. No massses, lumps or thyromegaly present.  Cardiovascular: Normal rate and rhythm. S1,S2 noted.  No murmur, rubs or gallops noted. No JVD or BLE edema. No carotid bruits noted. Pulmonary/Chest: Normal effort and positive vesicular breath sounds. No respiratory distress. No wheezes, rales or ronchi noted.  Abdomen: Soft and nontender. Normal bowel sounds, no bruits noted. No distention or masses noted. Liver, spleen and kidneys non palpable. Breast: Symmetrical, bilateral tissue consistency without masses or tenderness. Nipples symmetrical, pink areolae, and without discharge. GYN: External genitalia pink with even hair distribution, without tenderness, discharge, or redness. Vaginal mucosal lining pink and without discharge, tenderness, or redness. Cervix pink, without discharge, irritation, or motion tenderness. Uterus anterior, midline, not enlarged. Ovaries not palpated.  Musculoskeletal: Normal range of motion. Right ankle edema, palpable cyst to right lateral side, decreased range of motion to bilateral ankle. No difficulty with gait.  Neurological: Alert and oriented. Cranial nerves II-XII intact. Coordination normal. +DTRs bilaterally. Psychiatric: Mood and affect normal. Behavior is normal. Judgment and thought content normal.   EKG:  BMET    Component Value Date/Time    NA 140 12/31/2011 1149   K 3.5 12/31/2011 1149   CL 97 12/31/2011 1149   CO2 25 12/31/2011 1149   GLUCOSE 100* 12/31/2011 1149   BUN 12 12/31/2011 1149   CREATININE 0.72 12/31/2011 1149   CALCIUM 9.5 12/31/2011 1149   GFRNONAA 102 12/31/2011 1149   GFRAA 118 12/31/2011 1149    Lipid Panel     Component Value Date/Time   TRIG 267* 12/31/2011 1149   HDL 41 12/31/2011 1149   CHOLHDL 4.9* 12/31/2011 1149   LDLCALC 107* 12/31/2011 1149    CBC No results found for this basename: wbc, rbc, hgb, hct, plt, mcv, mch, mchc, rdw, neutrabs, lymphsabs, monoabs, eosabs, basosabs    Hgb A1C Lab Results  Component Value Date   HGBA1C 6.0* 12/31/2011     Assessment and Plan: Health Maintenance: Check CMET, CBC, Lipid panel, A1C; will call with results Educated on diet and exercise; decrease sugary drink, portion control; try to exercise at least 3-4 times a week  GYN exam Pap smear obtained Will call with results Follow up in one year  Right ankle pain Recommended Aleve OTC 500mg  one-two tablets twice daily as needed for pain Educated on importance of wearing brace Recommended ice packs at night If symptoms continue consider sport medicine referral  Arturo Morton, Jacques Earthly, Student-NP

## 2013-01-07 NOTE — Progress Notes (Signed)
Subjective:    Patient ID: Sheri Hood, female    DOB: 1965-11-28, 47 y.o.   MRN: [id number]  HPI  Pt presents to the clinic today for her annual physical, including her pap smear. She does have some concerns about right ankle pain and swelling. This started after she fell and broke her left foot. She did injure her right foot but did not fracture it. She did see an orthopedist who told her she had developed a cyst in that ankle. He advised her to have surgery but she has been putting it off. She does c/o sharp shooting pains in that right ankle and swelling that is worse throughout the day. She takes Ibuprofen with only mild relief.  Flu: Nov 2014 Tetanus: more than 10 years ago- today Pneumovax: never LMP: 10/2012 Pap Smear: 01/2012 Mammogram: 01/2013 Eye Doctor: yearly Dentist: yearly  Review of Systems      Past Medical History  Diagnosis Date  . Hypertension   . Heart murmur     recent diagnosed- / ehho 12/30/10 report on chart  . Headache(784.0)     h/x migraines  . Asthma   . Recurrent upper respiratory infection (URI)     occ sore throat and slightly stuffy- no fever or cough  . Hiatal hernia   . Arthritis     arhtritis middle back, scolosis  . Anemia     s/p ablation for heavy bleeding     Current Outpatient Prescriptions  Medication Sig Dispense Refill  . albuterol (PROVENTIL HFA;VENTOLIN HFA) 108 (90 BASE) MCG/ACT inhaler Inhale 2 puffs into the lungs every 6 (six) hours as needed for wheezing.  1 Inhaler  0  . citalopram (CELEXA) 40 MG tablet Take 1 tablet (40 mg total) by mouth at bedtime.  90 tablet  3  . hydrochlorothiazide (HYDRODIURIL) 25 MG tablet Take 1 tablet (25 mg total) by mouth every morning.  90 tablet  0  . ibuprofen (ADVIL,MOTRIN) 200 MG tablet Take 200 mg by mouth every 6 (six) hours as needed. For pain      . omeprazole (PRILOSEC OTC) 20 MG tablet Take 20 mg by mouth 2 (two) times daily.       . vitamin E 400 UNIT capsule Take 800 Units by  mouth at bedtime.        No current facility-administered medications for this visit.    Allergies  Allergen Reactions  . Benadryl [Diphenhydramine Hcl] Other (See Comments)    Keeps awake  . Codeine Other (See Comments)    Keeps awake  . Dilaudid [Hydromorphone Hcl] Itching and Nausea Only  . Morphine And Related Itching and Nausea Only    History reviewed. No pertinent family history.  History   Social History  . Marital Status: Married    Spouse Name: N/A    Number of Children: N/A  . Years of Education: N/A   Occupational History  . Not on file.   Social History Main Topics  . Smoking status: Former Smoker -- 0.50 packs/day for 4 years    Types: Cigarettes    Quit date: 01/02/1988  . Smokeless tobacco: Never Used  . Alcohol Use: Yes  . Drug Use: No  . Sexual Activity: Not on file   Other Topics Concern  . Not on file   Social History Narrative  . No narrative on file     Constitutional: Denies fever, malaise, fatigue, headache or abrupt weight changes.  HEENT: Denies eye pain, eye redness, ear pain, ringing  in the ears, wax buildup, runny nose, nasal congestion, bloody nose, or sore throat. Respiratory: Pt reports cough. Denies difficulty breathing, shortness of breath, or sputum production.   Cardiovascular: Denies chest pain, chest tightness, palpitations or swelling in the hands or feet.  Gastrointestinal: Denies abdominal pain, bloating, constipation, diarrhea or blood in the stool.  GU: Denies urgency, frequency, pain with urination, burning sensation, blood in urine, odor or discharge. Musculoskeletal: Denies decrease in range of motion, difficulty with gait, muscle pain.  Skin: Denies redness, rashes, lesions or ulcercations.  Neurological: Denies dizziness, difficulty with memory, difficulty with speech or problems with balance and coordination.   No other specific complaints in a complete review of systems (except as listed in HPI  above).  Objective:   Physical Exam   BP 132/80  Pulse 73  Temp(Src) 98.3 F (36.8 C) (Oral)  Ht 5\' 2"  (1.575 m)  Wt 216 lb (97.977 kg)  BMI 39.50 kg/m2  SpO2 97% Wt Readings from Last 3 Encounters:  01/07/13 216 lb (97.977 kg)  07/05/12 216 lb (97.977 kg)  02/17/12 214 lb (97.07 kg)    Constitutional:  Alert, oriented x 4, well developed, well nourished in no apparent distress. Skin: Skin is warm and dry.  No erythema, lesion or ulceration noted. HEENT: Head: normal shape and size; Eyes: sclera white, no icterus, conjunctiva pink, PERRLA and EOMs intact; Ears: Tm's gray and intact, normal light reflex; Nose: mucosa pink and moist, septum midline; Throat/Mouth: Teeth present, , mucosa pink and moist, no lesions or ulcerations noted. Neck: Normal range of motion. Neck supple, trachea midline. No massses, lumps or thyromegaly present.  Cardiovascular: Normal rate and rhythm. S1,S2 noted.  No murmur, rubs or gallops noted. No JVD or BLE edema. No carotid bruits noted. Pulmonary/Chest: Normal effort and positive vesicular breath sounds. No respiratory distress. No wheezes, rales or ronchi noted.  Abdomen: Soft and nontender. Normal bowel sounds, no bruits noted. No distention or masses noted. Liver, spleen and kidneys non palpable. Genitourinary: Normal female anatomy. Uterus midline, anterior and soft. No CMT or discharge noted. [place] non palpable. Breast without lumps or masses.  Musculoskeletal: Normal range of motion. 1+ swelling noted of the right ankle.  Neurological: Alert and oriented. Cranial nerves II-XII intact. Coordination normal. +DTRs bilaterally. Psychiatric: She has a normal mood and affect. Behavior is normal. Judgment and thought content normal.    BMET    Component Value Date/Time   NA 140 12/31/2011 1149   K 3.5 12/31/2011 1149   CL 97 12/31/2011 1149   CO2 25 12/31/2011 1149   GLUCOSE 100* 12/31/2011 1149   BUN 12 12/31/2011 1149   CREATININE 0.72  12/31/2011 1149   CALCIUM 9.5 12/31/2011 1149   GFRNONAA 102 12/31/2011 1149   GFRAA 118 12/31/2011 1149    Lipid Panel     Component Value Date/Time   TRIG 267* 12/31/2011 1149   HDL 41 12/31/2011 1149   CHOLHDL 4.9* 12/31/2011 1149   LDLCALC 107* 12/31/2011 1149    CBC No results found for this basename: wbc, rbc, hgb, hct, plt, mcv, mch, mchc, rdw, neutrabs, lymphsabs, monoabs, eosabs, basosabs    Hgb A1C Lab Results  Component Value Date   HGBA1C 6.0* 12/31/2011        Assessment & Plan:   Preventative Health Maintenance:  Tdap given today Pap smear obtained- will call you with the results She has not had her labs done yet but she will have them drawn at [facility]- labs have  already been ordered.  Right ankle pain and swelling, chronic:  Wear a soft, elastic brace which can be purchased from your local drug store. Ice it at night to help with pain/swelling Take Aleve instead of Ibuprofen to help with pain May benefit from sports medicine referral   RTC in 1 year or sooner if needed

## 2013-01-07 NOTE — Progress Notes (Signed)
Pre-visit discussion using our clinic review tool. No additional management support is needed unless otherwise documented below in the visit note.  

## 2013-01-07 NOTE — Addendum Note (Signed)
Addended by: Criselda Peaches B on: 01/07/2013 09:55 AM   Modules accepted: Orders

## 2013-01-07 NOTE — Addendum Note (Signed)
Addended by: Criselda Peaches B on: 01/07/2013 09:49 AM   Modules accepted: Orders

## 2013-01-10 ENCOUNTER — Encounter: Payer: Self-pay | Admitting: Family Medicine

## 2013-01-12 ENCOUNTER — Encounter: Payer: Self-pay | Admitting: Family Medicine

## 2013-01-12 LAB — PAP LB (LIQUID-BASED): PAP Smear Comment: 0

## 2013-04-08 ENCOUNTER — Other Ambulatory Visit: Payer: Self-pay | Admitting: *Deleted

## 2013-04-08 MED ORDER — HYDROCHLOROTHIAZIDE 25 MG PO TABS: 25.0000 mg | ORAL_TABLET | ORAL | Status: DC

## 2013-06-02 ENCOUNTER — Ambulatory Visit (INDEPENDENT_AMBULATORY_CARE_PROVIDER_SITE_OTHER): Payer: BC Managed Care – PPO | Admitting: Family Medicine

## 2013-06-02 ENCOUNTER — Encounter: Payer: Self-pay | Admitting: Family Medicine

## 2013-06-02 VITALS — BP 122/86 | HR 112 | Temp 100.0°F | Wt 216.8 lb

## 2013-06-02 DIAGNOSIS — R6889 Other general symptoms and signs: Secondary | ICD-10-CM | POA: Insufficient documentation

## 2013-06-02 MED ORDER — OSELTAMIVIR PHOSPHATE 75 MG PO CAPS: 75.0000 mg | ORAL_CAPSULE | Freq: Two times a day (BID) | ORAL | Status: DC

## 2013-06-02 MED ORDER — ALBUTEROL SULFATE HFA 108 (90 BASE) MCG/ACT IN AERS: 2.0000 | INHALATION_SPRAY | Freq: Four times a day (QID) | RESPIRATORY_TRACT | Status: DC | PRN

## 2013-06-02 NOTE — Patient Instructions (Signed)
Presumed flu.  Start tamiflu today.  Drink plenty of fluids, take tylenol as needed, and gargle with warm salt water for your throat.  This should gradually improve.  Take care.  Let us know if you have other concerns.

## 2013-06-02 NOTE — Assessment & Plan Note (Signed)
Nontoxic, presumed flu. Start tamiflu today, even if flu test has been neg.  D/w pt.  Use SABA in meantime.  She agrees.  F/u prn.

## 2013-06-02 NOTE — Progress Notes (Signed)
Pre visit review using our clinic review tool, if applicable. No additional management support is needed unless otherwise documented below in the visit note.  Sx started about 3 days ago.  Thought it was due to allergies initially.  Then worse last night.  Fever, ST, HA, diffuse aches, nausea.  Ear pain.  Stuffy, voice is altered.  Cough, some sputum.  Taking OTC cold medicine.   Meds, vitals, and allergies reviewed.   ROS: See HPI.  Otherwise, noncontributory.  GEN: nad, alert and oriented HEENT: mucous membranes moist, tm w/o erythema, nasal exam w/o erythema, clear discharge noted,  OP with cobblestoning NECK: supple w/o LA CV: rrr.   PULM: ctab, no inc wob EXT: no edema SKIN: no acute rash

## 2013-06-07 ENCOUNTER — Telehealth: Payer: Self-pay | Admitting: Family Medicine

## 2013-06-07 NOTE — Telephone Encounter (Signed)
PCP out of office, routed to Webb Silversmith, NP

## 2013-06-07 NOTE — Telephone Encounter (Signed)
Patient Information:  Caller Name: Mykel  Phone: [phone]  Patient: Sheri Hood  Gender: Female  DOB: October 11, 1965  Age: 48 Years  PCP: Arnette Norris [facility])  Pregnant: No  Office Follow Up:  Does the office need to follow up with this patient?: Yes  Instructions For The Office: Requesting Rx  RN Note:  Patient calling regarding cough which is ongoing. Unable to return to work today. Dx with Flu 06/01/13.  Body aches are gone, but patient continues with cough and congestion. states "I just feel all stopped up." Requesting consideration of Rx for antibiotic.  Declines visit at this time. Pharmacy [facility] [phone].   Symptoms  Reason For Call & Symptoms: follow up flu  Reviewed Health History In EMR: Yes  Reviewed Medications In EMR: Yes  Reviewed Allergies In EMR: Yes  Reviewed Surgeries / Procedures: Yes  Date of Onset of Symptoms: 06/02/2013  Treatments Tried: mucinex  Treatments Tried Worked: No OB / GYN:  LMP: Unknown  Guideline(s) Used:  Cough  Disposition Per Guideline:   See Today or Tomorrow in Office  Reason For Disposition Reached:   Continuous (nonstop) coughing interferes with work or school and no improvement using cough treatment per Care Advice  Advice Given:  Reassurance  Coughing is the way that our lungs remove irritants and mucus. It helps protect our lungs from getting pneumonia.  You can get a dry hacking cough after a chest cold. Sometimes this type of cough can last 1-3 weeks, and be worse at night.  Cough Medicines:  Home Remedy - Honey: This old home remedy has been shown to help decrease coughing at night. The adult dosage is 2 teaspoons (10 ml) at bedtime. Honey should not be given to infants under one year of age.  Coughing Spasms:  Drink warm fluids. Inhale warm mist (Reason: both relax the airway and loosen up the phlegm).  Prevent Dehydration:  Drink adequate liquids.  This will help soothe an irritated or dry throat and  loosen up the phlegm.  Expected Course:   Viral bronchitis (chest cold) causes a cough that lasts 1 to 3 weeks. Sometimes you may cough up lots of phlegm (sputum, mucus). The mucus can normally be white, gray, yellow, or green.  Call Back If:  Difficulty breathing  Cough lasts more than 3 weeks  You become worse.  Patient Refused Recommendation:  Patient Requests Prescription  Requesting Antibiotic

## 2013-06-07 NOTE — Telephone Encounter (Signed)
I think she should be reevaluated. Antibiotics are not indicated in flu like illness

## 2013-06-08 NOTE — Telephone Encounter (Signed)
Spoke to pt and she stated she felt a little better this morning--i did relay message as instructed but she declined making an appt to f/u at this time

## 2013-07-14 ENCOUNTER — Other Ambulatory Visit: Payer: Self-pay | Admitting: *Deleted

## 2013-07-14 MED ORDER — HYDROCHLOROTHIAZIDE 25 MG PO TABS: 25.0000 mg | ORAL_TABLET | ORAL | Status: DC

## 2013-07-21 ENCOUNTER — Other Ambulatory Visit: Payer: Self-pay | Admitting: *Deleted

## 2013-07-21 MED ORDER — CITALOPRAM HYDROBROMIDE 40 MG PO TABS: 40.0000 mg | ORAL_TABLET | Freq: Every day | ORAL | Status: DC

## 2013-09-01 ENCOUNTER — Ambulatory Visit: Payer: Self-pay | Admitting: Family Medicine

## 2013-09-01 ENCOUNTER — Encounter: Payer: Self-pay | Admitting: Family Medicine

## 2013-09-09 ENCOUNTER — Ambulatory Visit (INDEPENDENT_AMBULATORY_CARE_PROVIDER_SITE_OTHER): Payer: BC Managed Care – PPO | Admitting: Internal Medicine

## 2013-09-09 ENCOUNTER — Encounter: Payer: Self-pay | Admitting: Internal Medicine

## 2013-09-09 VITALS — BP 122/74 | HR 76 | Temp 98.1°F | Wt 215.0 lb

## 2013-09-09 DIAGNOSIS — I10 Essential (primary) hypertension: Secondary | ICD-10-CM | POA: Insufficient documentation

## 2013-09-09 NOTE — Assessment & Plan Note (Signed)
Well controlled on HCTZ Advised her to continue for now She will start keeping a record of her BP's at home Advised if either number is consistently > 140/90 to please follow up with PCP.

## 2013-09-09 NOTE — Patient Instructions (Addendum)
Hypertension Hypertension, commonly called high blood pressure, is when the force of blood pumping through your arteries is too strong. Your arteries are the blood vessels that carry blood from your heart throughout your body. A blood pressure reading consists of a higher number over a lower number, such as 110/72. The higher number (systolic) is the pressure inside your arteries when your heart pumps. The lower number (diastolic) is the pressure inside your arteries when your heart relaxes. Ideally you want your blood pressure below 120/80. Hypertension forces your heart to work harder to pump blood. Your arteries may become narrow or stiff. Having hypertension puts you at risk for heart disease, stroke, and other problems.  RISK FACTORS Some risk factors for high blood pressure are controllable. Others are not.  Risk factors you cannot control include:   Race. You may be at higher risk if you are African American.  Age. Risk increases with age.  Gender. Men are at higher risk than women before age 45 years. After age 65, women are at higher risk than men. Risk factors you can control include:  Not getting enough exercise or physical activity.  Being overweight.  Getting too much fat, sugar, calories, or salt in your diet.  Drinking too much alcohol. SIGNS AND SYMPTOMS Hypertension does not usually cause signs or symptoms. Extremely high blood pressure (hypertensive crisis) may cause headache, anxiety, shortness of breath, and nosebleed. DIAGNOSIS  To check if you have hypertension, your health care provider will measure your blood pressure while you are seated, with your arm held at the level of your heart. It should be measured at least twice using the same arm. Certain conditions can cause a difference in blood pressure between your right and left arms. A blood pressure reading that is higher than normal on one occasion does not mean that you need treatment. If one blood pressure reading  is high, ask your health care provider about having it checked again. TREATMENT  Treating high blood pressure includes making lifestyle changes and possibly taking medication. Living a healthy lifestyle can help lower high blood pressure. You may need to change some of your habits. Lifestyle changes may include:  Following the DASH diet. This diet is high in fruits, vegetables, and whole grains. It is low in salt, red meat, and added sugars.  Getting at least 2 1/2 hours of brisk physical activity every week.  Losing weight if necessary.  Not smoking.  Limiting alcoholic beverages.  Learning ways to reduce stress. If lifestyle changes are not enough to get your blood pressure under control, your health care provider may prescribe medicine. You may need to take more than one. Work closely with your health care provider to understand the risks and benefits. HOME CARE INSTRUCTIONS  Have your blood pressure rechecked as directed by your health care provider.   Only take medicine as directed by your health care provider. Follow the directions carefully. Blood pressure medicines must be taken as prescribed. The medicine does not work as well when you skip doses. Skipping doses also puts you at risk for problems.   Do not smoke.   Monitor your blood pressure at home as directed by your health care provider. SEEK MEDICAL CARE IF:   You think you are having a reaction to medicines taken.  You have recurrent headaches or feel dizzy.  You have swelling in your ankles.  You have trouble with your vision. SEEK IMMEDIATE MEDICAL CARE IF:  You develop a severe headache or   confusion.  You have unusual weakness, numbness, or feel faint.  You have severe chest or abdominal pain.  You vomit repeatedly.  You have trouble breathing. MAKE SURE YOU:   Understand these instructions.  Will watch your condition.  Will get help right away if you are not doing well or get  worse. Document Released: 02/17/2005 Document Revised: 02/22/2013 Document Reviewed: 12/10/2012 Exit[employer] Patient Information 2015 [employer]. This information is not intended to replace advice given to you by your health care provider. Make sure you discuss any questions you have with your health care provider.  

## 2013-09-09 NOTE — Progress Notes (Signed)
Pre visit review using our clinic review tool, if applicable. No additional management support is needed unless otherwise documented below in the visit note. 

## 2013-09-09 NOTE — Progress Notes (Signed)
Subjective:    Patient ID: Sheri Hood, female    DOB: 1965-07-20, 48 y.o.   MRN: [id number]  HPI  Pt presents to the clinic today for BP check. She was at gyn yesterday. Her BP at that time was 131/104. She was advised to follow up with her PCP. She denies dizziness, headache, blurred vision, chest pain or shortness of breath. She is currently taking her HCTZ. Her blood pressure today is 122/74.  Review of Systems      Past Medical History  Diagnosis Date  . Hypertension   . Heart murmur     recent diagnosed- / ehho 12/30/10 report on chart  . Headache(784.0)     h/x migraines  . Asthma   . Recurrent upper respiratory infection (URI)     occ sore throat and slightly stuffy- no fever or cough  . Hiatal hernia   . Arthritis     arhtritis middle back, scolosis  . Anemia     s/p ablation for heavy bleeding     Current Outpatient Prescriptions  Medication Sig Dispense Refill  . albuterol (PROVENTIL HFA;VENTOLIN HFA) 108 (90 BASE) MCG/ACT inhaler Inhale 2 puffs into the lungs every 6 (six) hours as needed for wheezing.  1 Inhaler  0  . citalopram (CELEXA) 40 MG tablet Take 1 tablet (40 mg total) by mouth at bedtime.  90 tablet  0  . hydrochlorothiazide (HYDRODIURIL) 25 MG tablet Take 1 tablet (25 mg total) by mouth every morning.  90 tablet  0  . ibuprofen (ADVIL,MOTRIN) 200 MG tablet Take 200 mg by mouth every 6 (six) hours as needed. For pain      . omeprazole (PRILOSEC OTC) 20 MG tablet Take 20 mg by mouth 2 (two) times daily.       . vitamin E 400 UNIT capsule Take 800 Units by mouth at bedtime.        No current facility-administered medications for this visit.    Allergies  Allergen Reactions  . Benadryl [Diphenhydramine Hcl] Other (See Comments)    Keeps awake  . Codeine Other (See Comments)    Keeps awake  . Dilaudid [Hydromorphone Hcl] Itching and Nausea Only  . Morphine And Related Itching and Nausea Only    No family history on file.  History    Social History  . Marital Status: Married    Spouse Name: N/A    Number of Children: N/A  . Years of Education: N/A   Occupational History  . Not on file.   Social History Main Topics  . Smoking status: Former Smoker -- 0.50 packs/day for 4 years    Types: Cigarettes    Quit date: 01/02/1988  . Smokeless tobacco: Never Used  . Alcohol Use: Yes  . Drug Use: No  . Sexual Activity: Not on file   Other Topics Concern  . Not on file   Social History Narrative  . No narrative on file     Constitutional: Denies fever, malaise, fatigue, headache or abrupt weight changes.  HEENT: Denies eye pain, eye redness, ear pain, ringing in the ears, wax buildup, runny nose, nasal congestion, bloody nose, or sore throat. Respiratory: Denies difficulty breathing, shortness of breath, cough or sputum production.   Cardiovascular: Denies chest pain, chest tightness, palpitations or swelling in the hands or feet.   Neurological: Denies dizziness, difficulty with memory, difficulty with speech or problems with balance and coordination.   No other specific complaints in a complete review of systems (  except as listed in HPI above).  Objective:   Physical Exam   BP 122/74  Pulse 76  Temp(Src) 98.1 F (36.7 C) (Oral)  Wt 215 lb (97.523 kg)  SpO2 98% Wt Readings from Last 3 Encounters:  09/09/13 215 lb (97.523 kg)  06/02/13 216 lb 12 oz (98.317 kg)  01/07/13 216 lb (97.977 kg)    General: Appears her stated age, obese but well developed, well nourished in NAD. Cardiovascular: Normal rate and rhythm. S1,S2 noted.  No murmur, rubs or gallops noted. No JVD or BLE edema. No carotid bruits noted. Pulmonary/Chest: Normal effort and positive vesicular breath sounds. No respiratory distress. No wheezes, rales or ronchi noted.  Neurological: Alert and oriented. Cranial nerves grossly intact. Coordination normal. +DTRs bilaterally.   BMET    Component Value Date/Time   NA 140 12/31/2011 1149    K 3.5 12/31/2011 1149   CL 97 12/31/2011 1149   CO2 25 12/31/2011 1149   GLUCOSE 100* 12/31/2011 1149   BUN 12 12/31/2011 1149   CREATININE 0.72 12/31/2011 1149   CALCIUM 9.5 12/31/2011 1149   GFRNONAA 102 12/31/2011 1149   GFRAA 118 12/31/2011 1149    Lipid Panel     Component Value Date/Time   TRIG 267* 12/31/2011 1149   HDL 41 12/31/2011 1149   CHOLHDL 4.9* 12/31/2011 1149   LDLCALC 107* 12/31/2011 1149    CBC No results found for this basename: wbc, rbc, hgb, hct, plt, mcv, mch, mchc, rdw, neutrabs, lymphsabs, monoabs, eosabs, basosabs    Hgb A1C Lab Results  Component Value Date   HGBA1C 6.0* 12/31/2011        Assessment & Plan:

## 2013-10-12 ENCOUNTER — Other Ambulatory Visit: Payer: Self-pay | Admitting: *Deleted

## 2013-10-12 MED ORDER — HYDROCHLOROTHIAZIDE 25 MG PO TABS: 25.0000 mg | ORAL_TABLET | ORAL | Status: DC

## 2013-10-14 ENCOUNTER — Other Ambulatory Visit: Payer: Self-pay

## 2013-10-14 MED ORDER — CITALOPRAM HYDROBROMIDE 40 MG PO TABS: 40.0000 mg | ORAL_TABLET | Freq: Every day | ORAL | Status: DC

## 2013-10-14 NOTE — Telephone Encounter (Signed)
Pt left v/m requesting refill celexa to [facility] [address]. Pt notified done. Pt saw GYN earlier and had pap smear; pt wants to know if when schedules appt with Dr Deborra Medina will she have to have another pap; advised if already had pap by GYN and that doctor is following pt she will not need a pap when comes in for annual exam.Pt will let Dr Deborra Medina know at time of visit about having pap smear with GYN.

## 2013-12-13 ENCOUNTER — Telehealth: Payer: Self-pay | Admitting: Family Medicine

## 2013-12-13 NOTE — Telephone Encounter (Signed)
Patient has an appointment for a physical on 01/09/14.  Patient works for [employer] and would like an order for her lab work mailed to her.

## 2013-12-14 NOTE — Telephone Encounter (Signed)
Labs mailed to pt as requested.

## 2013-12-14 NOTE — Telephone Encounter (Signed)
Orders written on rx pad and in my box.

## 2014-01-09 ENCOUNTER — Encounter: Payer: Self-pay | Admitting: Family Medicine

## 2014-01-09 ENCOUNTER — Telehealth: Payer: Self-pay

## 2014-01-09 ENCOUNTER — Ambulatory Visit (INDEPENDENT_AMBULATORY_CARE_PROVIDER_SITE_OTHER): Payer: BC Managed Care – PPO | Admitting: Family Medicine

## 2014-01-09 VITALS — BP 120/80 | HR 80 | Temp 98.0°F | Ht 63.0 in | Wt 212.5 lb

## 2014-01-09 DIAGNOSIS — D259 Leiomyoma of uterus, unspecified: Secondary | ICD-10-CM | POA: Insufficient documentation

## 2014-01-09 DIAGNOSIS — F419 Anxiety disorder, unspecified: Secondary | ICD-10-CM

## 2014-01-09 DIAGNOSIS — I10 Essential (primary) hypertension: Secondary | ICD-10-CM

## 2014-01-09 DIAGNOSIS — L918 Other hypertrophic disorders of the skin: Secondary | ICD-10-CM

## 2014-01-09 DIAGNOSIS — E559 Vitamin D deficiency, unspecified: Secondary | ICD-10-CM | POA: Insufficient documentation

## 2014-01-09 DIAGNOSIS — F32A Depression, unspecified: Secondary | ICD-10-CM | POA: Insufficient documentation

## 2014-01-09 DIAGNOSIS — F329 Major depressive disorder, single episode, unspecified: Secondary | ICD-10-CM

## 2014-01-09 DIAGNOSIS — Z23 Encounter for immunization: Secondary | ICD-10-CM

## 2014-01-09 DIAGNOSIS — Z01419 Encounter for gynecological examination (general) (routine) without abnormal findings: Secondary | ICD-10-CM | POA: Insufficient documentation

## 2014-01-09 DIAGNOSIS — R0683 Snoring: Secondary | ICD-10-CM

## 2014-01-09 DIAGNOSIS — Z0000 Encounter for general adult medical examination without abnormal findings: Secondary | ICD-10-CM

## 2014-01-09 MED ORDER — CITALOPRAM HYDROBROMIDE 40 MG PO TABS: 40.0000 mg | ORAL_TABLET | Freq: Every day | ORAL | Status: DC

## 2014-01-09 MED ORDER — HYDROCHLOROTHIAZIDE 25 MG PO TABS: 25.0000 mg | ORAL_TABLET | ORAL | Status: DC

## 2014-01-09 NOTE — Telephone Encounter (Signed)
Pt left v/m; pt was seen earlier today and forgot to ask Dr Deborra Medina about evaluation for sleep apnea; pts husband thinks pt has sleep apnea. Pt request cb.

## 2014-01-09 NOTE — Telephone Encounter (Signed)
Please call back to get more information- is she snoring?  Does he feel like she stops breathing at night?

## 2014-01-09 NOTE — Progress Notes (Signed)
Patient ID: Sheri Hood, female   DOB: [birth year], 48 y.o.   MRN: [id number]   Subjective:   Patient ID: Sheri Hood, female    DOB: [birth year], 48 y.o.   MRN: [id number]  Sheri Hood is a pleasant 48 y.o. year old female who presents to clinic today with Annual Exam  on 01/09/2014  HPI: G4P3- last pap smear done by [facility] on 01/07/13. No h/o abnormal pap smears. Remote h/o ablation for heavy bleeding.  Started bleeding in 06/2013- saw OBGYN- ultrasound showed fibroids- hysterectomy recommended.  She has not scheduled this yet.  Bleeding has since stopped but has had increased urinary urgency and frequency for months.  Mammogram 09/04/13 Had flu shot done in 12/2013.   Has skin tags on her left side that are irritated- right on her bra line.  She would like them removed today.  HTN- has been well controlled on HCTZ 25 mg daily. Denies any HA, blurred vision, CP or SOB.  Depression- has been stable on current dose of celexa.  Expecting her first grandchild in a few weeks. She is very excited. Current Outpatient Prescriptions on File Prior to Visit  Medication Sig Dispense Refill  . albuterol (PROVENTIL HFA;VENTOLIN HFA) 108 (90 BASE) MCG/ACT inhaler Inhale 2 puffs into the lungs every 6 (six) hours as needed for wheezing. 1 Inhaler 0  . ibuprofen (ADVIL,MOTRIN) 200 MG tablet Take 200 mg by mouth every 6 (six) hours as needed. For pain    . omeprazole (PRILOSEC OTC) 20 MG tablet Take 20 mg by mouth 2 (two) times daily.     . vitamin E 400 UNIT capsule Take 800 Units by mouth at bedtime.      No current facility-administered medications on file prior to visit.    Allergies  Allergen Reactions  . Benadryl [Diphenhydramine Hcl] Other (See Comments)    Keeps awake  . Codeine Other (See Comments)    Keeps awake  . Dilaudid [Hydromorphone Hcl] Itching and Nausea Only  . Morphine And Related Itching and Nausea Only    Past Medical History  Diagnosis Date  .  Hypertension   . Heart murmur     recent diagnosed- / ehho 12/30/10 report on chart  . Headache(784.0)     h/x migraines  . Asthma   . Recurrent upper respiratory infection (URI)     occ sore throat and slightly stuffy- no fever or cough  . Hiatal hernia   . Arthritis     arhtritis middle back, scolosis  . Anemia     s/p ablation for heavy bleeding     Past Surgical History  Procedure Laterality Date  . Cholecystectomy    . Tonsillectomy    . Ablation colpoclesis    . Fracture surgery    . Hardware removal  01/08/2011    Procedure: HARDWARE REMOVAL;  Surgeon: Laurice Record Aplington;  Location: [facility];  Service: Orthopedics;  Laterality: Left;  screw and plate left ankle and Fusion of Subtalor Joint Left Ankle    (Large c-arm)     No family history on file.  History   Social History  . Marital Status: Married    Spouse Name: N/A    Number of Children: N/A  . Years of Education: N/A   Occupational History  . Not on file.   Social History Main Topics  . Smoking status: Former Smoker -- 0.50 packs/day for 4 years    Types: Cigarettes    Quit date: 01/02/1988  .  Smokeless tobacco: Never Used  . Alcohol Use: Yes  . Drug Use: No  . Sexual Activity: Not on file   Other Topics Concern  . Not on file   Social History Narrative   The PMH, PSH, Social History, Family History, Medications, and allergies have been reviewed in [facility], and have been updated if relevant.  Lab Results  Component Value Date   HDL 41 12/31/2011   LDLCALC 107* 12/31/2011   TRIG 267* 12/31/2011   CHOLHDL 4.9* 12/31/2011   Lab Results  Component Value Date   CREATININE 0.72 12/31/2011     Review of Systems  Unable to perform ROS Constitutional: Negative.   HENT: Negative.   Eyes: Negative.   Respiratory: Negative.   Cardiovascular: Negative.   Gastrointestinal: Negative.   Genitourinary: Positive for urgency, frequency and vaginal bleeding.  Allergic/Immunologic: Negative.   Neurological:  Negative.   Hematological: Negative.   Psychiatric/Behavioral: Negative.   All other systems reviewed and are negative.      Objective:    BP 120/80 mmHg  Pulse 80  Temp(Src) 98 F (36.7 C) (Oral)  Ht 5\' 3"  (1.6 m)  Wt 212 lb 8 oz (96.389 kg)  BMI 37.65 kg/m2  SpO2 98%   Physical Exam BP 120/80 mmHg  Pulse 80  Temp(Src) 98 F (36.7 C) (Oral)  Ht 5\' 3"  (1.6 m)  Wt 212 lb 8 oz (96.389 kg)  BMI 37.65 kg/m2  SpO2 98%   General:  Well-developed,well-nourished,in no acute distress; alert,appropriate and cooperative throughout examination Head:  normocephalic and atraumatic.   Eyes:  vision grossly intact, pupils equal, pupils round, and pupils reactive to light.   Ears:  R ear normal and L ear normal.   Nose:  no external deformity.   Mouth:  good dentition.   Neck:  No deformities, masses, or tenderness noted. Breasts:  No mass, nodules, thickening, tenderness, bulging, retraction, inflamation, nipple discharge or skin changes noted.   Lungs:  Normal respiratory effort, chest expands symmetrically. Lungs are clear to auscultation, no crackles or wheezes. Heart:  Normal rate and regular rhythm. S1 and S2 normal without gallop, murmur, click, rub or other extra sounds. Abdomen:  Bowel sounds positive,abdomen soft and non-tender without masses, organomegaly or hernias noted. Msk:  No deformity or scoliosis noted of thoracic or lumbar spine.   Extremities:  No clubbing, cyanosis, edema, or deformity noted with normal full range of motion of all joints.   Neurologic:  alert & oriented X3 and gait normal.   Skin:  Intact without suspicious lesions or rashes  Several benign skin tags are noted on her left side Cervical Nodes:  No lymphadenopathy noted Axillary Nodes:  No palpable lymphadenopathy Psych:  Cognition and judgment appear intact. Alert and cooperative with normal attention span and concentration. No apparent delusions, illusions, hallucinations       Assessment &  Plan:   Well woman exam with routine gynecological exam  Essential hypertension No Follow-up on file.

## 2014-01-09 NOTE — Assessment & Plan Note (Signed)
Well controlled on current rx. No changes made today. 

## 2014-01-09 NOTE — Progress Notes (Signed)
Pre visit review using our clinic review tool, if applicable. No additional management support is needed unless otherwise documented below in the visit note. 

## 2014-01-09 NOTE — Assessment & Plan Note (Signed)
Well controlled on current dose of celexa. No changes made today. 

## 2014-01-09 NOTE — Patient Instructions (Signed)
Great to see you.  We will call you with your lab results. 

## 2014-01-09 NOTE — Assessment & Plan Note (Signed)
Skin tags are snipped off using Betadine for cleansing and sterile iris scissors. Local anesthesia was  used. These pathognomonic lesions are not sent for pathology.

## 2014-01-09 NOTE — Assessment & Plan Note (Signed)
She had many questions about this today. Advised her to discus more with OBGYN but I did answer the questions I could.

## 2014-01-09 NOTE — Assessment & Plan Note (Addendum)
Reviewed preventive care protocols, scheduled due services, and updated immunizations Discussed nutrition, exercise, diet, and healthy lifestyle.  Tdap today-expecting new grandchild.

## 2014-01-09 NOTE — Addendum Note (Signed)
Addended by: Modena Nunnery on: 01/09/2014 10:15 AM   Modules accepted: Orders

## 2014-01-09 NOTE — Addendum Note (Signed)
Addended by: Lucille Passy on: 01/09/2014 10:03 AM   Modules accepted: Orders, SmartSet

## 2014-01-10 ENCOUNTER — Telehealth: Payer: Self-pay | Admitting: Family Medicine

## 2014-01-10 LAB — CBC WITH DIFFERENTIAL/PLATELET
BASOS: 1 %
Basophils Absolute: 0 10*3/uL (ref 0.0–0.2)
EOS ABS: 0.1 10*3/uL (ref 0.0–0.4)
Eos: 2 %
HEMATOCRIT: 44.7 % (ref 34.0–46.6)
HEMOGLOBIN: 14.8 g/dL (ref 11.1–15.9)
Immature Grans (Abs): 0 10*3/uL (ref 0.0–0.1)
Immature Granulocytes: 0 %
LYMPHS ABS: 1.8 10*3/uL (ref 0.7–3.1)
LYMPHS: 33 %
MCH: 28.6 pg (ref 26.6–33.0)
MCHC: 33.1 g/dL (ref 31.5–35.7)
MCV: 86 fL (ref 79–97)
Monocytes Absolute: 0.5 10*3/uL (ref 0.1–0.9)
Monocytes: 9 %
NEUTROS ABS: 3.1 10*3/uL (ref 1.4–7.0)
Neutrophils Relative %: 55 %
RBC: 5.18 x10E6/uL (ref 3.77–5.28)
RDW: 13.6 % (ref 12.3–15.4)
WBC: 5.6 10*3/uL (ref 3.4–10.8)

## 2014-01-10 LAB — COMPREHENSIVE METABOLIC PANEL
A/G RATIO: 2 (ref 1.1–2.5)
ALBUMIN: 4.9 g/dL (ref 3.5–5.5)
ALT: 44 IU/L — ABNORMAL HIGH (ref 0–32)
AST: 31 IU/L (ref 0–40)
Alkaline Phosphatase: 74 IU/L (ref 39–117)
BILIRUBIN TOTAL: 0.3 mg/dL (ref 0.0–1.2)
BUN/Creatinine Ratio: 14 (ref 9–23)
BUN: 12 mg/dL (ref 6–24)
CO2: 26 mmol/L (ref 18–29)
Calcium: 10 mg/dL (ref 8.7–10.2)
Chloride: 96 mmol/L — ABNORMAL LOW (ref 97–108)
Creatinine, Ser: 0.84 mg/dL (ref 0.57–1.00)
GFR, EST AFRICAN AMERICAN: 96 mL/min/{1.73_m2} (ref >59)
GFR, EST NON AFRICAN AMERICAN: 84 mL/min/{1.73_m2} (ref >59)
GLUCOSE: 127 mg/dL — AB (ref 65–99)
Globulin, Total: 2.4 g/dL (ref 1.5–4.5)
POTASSIUM: 3.7 mmol/L (ref 3.5–5.2)
Sodium: 138 mmol/L (ref 134–144)
Total Protein: 7.3 g/dL (ref 6.0–8.5)

## 2014-01-10 LAB — LIPID PANEL
Chol/HDL Ratio: 4 ratio units (ref 0.0–4.4)
Cholesterol, Total: 186 mg/dL (ref 100–199)
HDL: 47 mg/dL (ref >39)
LDL CALC: 112 mg/dL — AB (ref 0–99)
Triglycerides: 134 mg/dL (ref 0–149)
VLDL Cholesterol Cal: 27 mg/dL (ref 5–40)

## 2014-01-10 LAB — TSH: TSH: 1.14 u[IU]/mL (ref 0.450–4.500)

## 2014-01-10 LAB — VITAMIN D 25 HYDROXY (VIT D DEFICIENCY, FRACTURES): VIT D 25 HYDROXY: 47.2 ng/mL (ref 30.0–100.0)

## 2014-01-10 NOTE — Telephone Encounter (Signed)
emmi emailed °

## 2014-01-10 NOTE — Telephone Encounter (Signed)
Spoke to pt and informed her referral has been placed; pt advised to await a call with appt details

## 2014-01-10 NOTE — Telephone Encounter (Signed)
Yes I completely agree that she should be tested for sleep apnea.  Referral placed.

## 2014-01-10 NOTE — Telephone Encounter (Signed)
Spoke to pt who states that her husband complains of her snoring loudly and when she wakes up her mouth is completely dry. Her husband believes that she may stop breathing at times, as there is a "pause" in her snoring.

## 2014-02-08 DIAGNOSIS — G479 Sleep disorder, unspecified: Secondary | ICD-10-CM | POA: Insufficient documentation

## 2014-03-08 ENCOUNTER — Ambulatory Visit: Payer: Self-pay | Admitting: Specialist

## 2014-05-03 ENCOUNTER — Other Ambulatory Visit: Payer: Self-pay | Admitting: *Deleted

## 2014-05-03 MED ORDER — CITALOPRAM HYDROBROMIDE 40 MG PO TABS: 40.0000 mg | ORAL_TABLET | Freq: Every day | ORAL | Status: DC

## 2014-05-03 NOTE — Telephone Encounter (Signed)
Pt request status of celexa refill; advised sent to [facility] [address] earlier today. Pt will ck with pharmacy.

## 2014-07-25 ENCOUNTER — Other Ambulatory Visit: Payer: Self-pay | Admitting: *Deleted

## 2014-07-25 MED ORDER — HYDROCHLOROTHIAZIDE 25 MG PO TABS: 25.0000 mg | ORAL_TABLET | ORAL | Status: DC

## 2014-07-25 NOTE — Telephone Encounter (Signed)
Pt called for status of refill HCTZ; spoke with Jasmine at [address] and rx is being prepared now. Pt voiced understanding.

## 2014-09-05 ENCOUNTER — Other Ambulatory Visit: Payer: Self-pay | Admitting: Family Medicine

## 2014-09-05 DIAGNOSIS — Z1231 Encounter for screening mammogram for malignant neoplasm of breast: Secondary | ICD-10-CM

## 2014-09-07 ENCOUNTER — Ambulatory Visit
Admission: RE | Admit: 2014-09-07 | Discharge: 2014-09-07 | Disposition: A | Discharge status: Home / Self Care | Payer: BLUE CROSS/BLUE SHIELD | Source: Ambulatory Visit | Attending: Family Medicine | Admitting: Family Medicine

## 2014-09-07 DIAGNOSIS — Z1231 Encounter for screening mammogram for malignant neoplasm of breast: Secondary | ICD-10-CM | POA: Diagnosis not present

## 2014-11-07 ENCOUNTER — Other Ambulatory Visit: Payer: Self-pay | Admitting: Family Medicine

## 2014-12-04 ENCOUNTER — Encounter: Payer: Self-pay | Admitting: Family Medicine

## 2014-12-04 ENCOUNTER — Ambulatory Visit (INDEPENDENT_AMBULATORY_CARE_PROVIDER_SITE_OTHER): Payer: BLUE CROSS/BLUE SHIELD | Admitting: Family Medicine

## 2014-12-04 VITALS — BP 118/74 | HR 80 | Temp 97.7°F | Wt 223.5 lb

## 2014-12-04 DIAGNOSIS — R21 Rash and other nonspecific skin eruption: Secondary | ICD-10-CM

## 2014-12-04 DIAGNOSIS — R300 Dysuria: Secondary | ICD-10-CM

## 2014-12-04 LAB — POCT URINALYSIS DIPSTICK
Bilirubin, UA: NEGATIVE
Blood, UA: NEGATIVE
Glucose, UA: NEGATIVE
Ketones, UA: NEGATIVE
LEUKOCYTES UA: NEGATIVE
Nitrite, UA: NEGATIVE
PROTEIN UA: NEGATIVE
UROBILINOGEN UA: 0.2
pH, UA: 6

## 2014-12-04 MED ORDER — PREDNISONE 20 MG PO TABS: ORAL_TABLET | ORAL | Status: DC

## 2014-12-04 NOTE — Assessment & Plan Note (Signed)
New- consistent with plant dermatitis. Given extensive distribution, treat with oral prednisone taper. Supportive care as per AVS. The patient indicates understanding of these issues and agrees with the plan.

## 2014-12-04 NOTE — Progress Notes (Signed)
Subjective:   Patient ID: Sheri Hood, female    DOB: [birth year], 49 y.o.   MRN: [id number]  Sheri Hood is a pleasant 49 y.o. year old female who presents to clinic today with Rash  and dysuria on 12/04/2014  HPI:  Dysuria- 2-3 weeks of dysuria, suprapubic pressure.  Increased urinary frequency.  No hematuria.  No flank pain, no fevers, nausea or vomiting.  Rash- very itchy.  Started on right leg two days ago after she was outside and now has spread to the other leg, blistered.  Topical hydrocortisone has only helped a little.  No fevers or chills.  No surrounding erythema or warmth.  Current Outpatient Prescriptions on File Prior to Visit  Medication Sig Dispense Refill  . citalopram (CELEXA) 40 MG tablet TAKE ONE TABLET BY MOUTH AT BEDTIME 90 tablet 0  . hydrochlorothiazide (HYDRODIURIL) 25 MG tablet Take 1 tablet (25 mg total) by mouth every morning. 90 tablet 1  . ibuprofen (ADVIL,MOTRIN) 200 MG tablet Take 200 mg by mouth every 6 (six) hours as needed. For pain    . omeprazole (PRILOSEC OTC) 20 MG tablet Take 20 mg by mouth 2 (two) times daily.     . vitamin E 400 UNIT capsule Take 800 Units by mouth at bedtime.      No current facility-administered medications on file prior to visit.    Allergies  Allergen Reactions  . Benadryl [Diphenhydramine Hcl] Other (See Comments)    Keeps awake  . Codeine Other (See Comments)    Keeps awake  . Dilaudid [Hydromorphone Hcl] Itching and Nausea Only  . Morphine And Related Itching and Nausea Only    Past Medical History  Diagnosis Date  . Hypertension   . Heart murmur     recent diagnosed- / ehho 12/30/10 report on chart  . Headache(784.0)     h/x migraines  . Asthma   . Recurrent upper respiratory infection (URI)     occ sore throat and slightly stuffy- no fever or cough  . Hiatal hernia   . Arthritis     arhtritis middle back, scolosis  . Anemia     s/p ablation for heavy bleeding     Past Surgical History    Procedure Laterality Date  . Cholecystectomy    . Tonsillectomy    . Ablation colpoclesis    . Fracture surgery    . Hardware removal  01/08/2011    Procedure: HARDWARE REMOVAL;  Surgeon: Laurice Record Aplington;  Location: [facility];  Service: Orthopedics;  Laterality: Left;  screw and plate left ankle and Fusion of Subtalor Joint Left Ankle    (Large c-arm)     No family history on file.  Social History   Social History  . Marital Status: Married    Spouse Name: N/A  . Number of Children: N/A  . Years of Education: N/A   Occupational History  . Not on file.   Social History Main Topics  . Smoking status: Former Smoker -- 0.50 packs/day for 4 years    Types: Cigarettes    Quit date: 01/02/1988  . Smokeless tobacco: Never Used  . Alcohol Use: Yes  . Drug Use: No  . Sexual Activity: Not on file   Other Topics Concern  . Not on file   Social History Narrative   The PMH, PSH, Social History, Family History, Medications, and allergies have been reviewed in [facility], and have been updated if relevant.   Review of Systems  Respiratory: Negative.   Cardiovascular: Negative.   Gastrointestinal: Negative.   Genitourinary: Positive for dysuria and frequency. Negative for urgency, hematuria, flank pain, decreased urine volume, enuresis, difficulty urinating, genital sores, menstrual problem, pelvic pain and dyspareunia.  Skin: Positive for rash.  All other systems reviewed and are negative.      Objective:    BP 118/74 mmHg  Pulse 80  Temp(Src) 97.7 F (36.5 C) (Oral)  Wt 223 lb 8 oz (101.379 kg)  SpO2 97%   Physical Exam  Constitutional: She is oriented to person, place, and time. She appears well-developed and well-nourished. No distress.  HENT:  Head: Normocephalic.  Eyes: Conjunctivae are normal.  Cardiovascular: Normal rate.   Pulmonary/Chest: Effort normal.  Abdominal: Soft. Bowel sounds are normal. She exhibits no distension. There is no tenderness.  Musculoskeletal:  Normal range of motion.  Neurological: She is alert and oriented to person, place, and time. No cranial nerve deficit.  Skin:     Psychiatric: She has a normal mood and affect. Her behavior is normal. Judgment and thought content normal.  Nursing note and vitals reviewed.         Assessment & Plan:   Rash and nonspecific skin eruption  Dysuria No Follow-up on file.

## 2014-12-04 NOTE — Assessment & Plan Note (Signed)
New- UA neg. Discussed avoiding bladder irritants and drinking more water. Call or return to clinic prn if these symptoms worsen or fail to improve as anticipated. The patient indicates understanding of these issues and agrees with the plan.

## 2014-12-04 NOTE — Patient Instructions (Signed)

## 2014-12-14 ENCOUNTER — Telehealth: Payer: Self-pay | Admitting: *Deleted

## 2014-12-14 MED ORDER — PREDNISONE 20 MG PO TABS: ORAL_TABLET | ORAL | Status: DC

## 2014-12-14 NOTE — Telephone Encounter (Signed)
Pt left voicemail at Triage. Pt was seen on 12/04/14 by Dr. Deborra Medina for a rash. Pt said she has finished the prednisone and she has been using cortisone creams and the rash hasn't cleared up completley. Pt said it looks a little better but it's still there and it's itchy. Pt is requesting another med or more prednisone sent to her pharmacy ([facility]

## 2014-12-14 NOTE — Telephone Encounter (Signed)
Agree, likely needs extension of oral prednisone.  eRx sent.

## 2015-01-01 ENCOUNTER — Ambulatory Visit (INDEPENDENT_AMBULATORY_CARE_PROVIDER_SITE_OTHER): Payer: BLUE CROSS/BLUE SHIELD | Admitting: Family Medicine

## 2015-01-01 ENCOUNTER — Encounter: Payer: Self-pay | Admitting: Family Medicine

## 2015-01-01 VITALS — BP 150/92 | HR 80 | Temp 98.2°F | Ht 63.0 in | Wt 226.5 lb

## 2015-01-01 DIAGNOSIS — Z87442 Personal history of urinary calculi: Secondary | ICD-10-CM | POA: Diagnosis not present

## 2015-01-01 DIAGNOSIS — R109 Unspecified abdominal pain: Secondary | ICD-10-CM | POA: Diagnosis not present

## 2015-01-01 LAB — POCT URINALYSIS DIPSTICK
Bilirubin, UA: NEGATIVE
Blood, UA: NEGATIVE
Glucose, UA: 100
Ketones, UA: NEGATIVE
Nitrite, UA: NEGATIVE
PH UA: 5.5
PROTEIN UA: NEGATIVE
Spec Grav, UA: 1.02
UROBILINOGEN UA: 0.2

## 2015-01-01 LAB — URINALYSIS, MICROSCOPIC ONLY

## 2015-01-01 MED ORDER — TAMSULOSIN HCL 0.4 MG PO CAPS: 0.4000 mg | ORAL_CAPSULE | Freq: Every day | ORAL | Status: DC

## 2015-01-01 MED ORDER — ONDANSETRON HCL 4 MG PO TABS: 4.0000 mg | ORAL_TABLET | Freq: Three times a day (TID) | ORAL | Status: DC | PRN

## 2015-01-01 MED ORDER — OXYCODONE-ACETAMINOPHEN 5-325 MG PO TABS
1.0000 | ORAL_TABLET | Freq: Four times a day (QID) | ORAL | Status: DC | PRN
Start: 2015-01-01 — End: 2015-08-08

## 2015-01-01 NOTE — Progress Notes (Signed)
Subjective:  Patient ID: Sheri Hood, female    DOB: [birth year]  Age: 49 y.o. MRN: [id number]  CC: Flank pain, kidney stone  HPI:  49 year old female with a past medical history of recurrent nephrolithiasis presents to clinic today for an acute visit with complaints of flank pain. She is concerned about stone.  Patient reports that she woke up this morning with severe right flank pain. She denies any radiation. She reports associated nausea but denies vomiting. She denies any hematuria. No associated fever or chills. Pain is severe currently. She took some Advil with no improvement of her symptoms. She states that she has had these in the past and they have presented in a similar fashion.  Social Hx   Social History   Social History  . Marital Status: Married    Spouse Name: N/A  . Number of Children: N/A  . Years of Education: N/A   Social History Main Topics  . Smoking status: Former Smoker -- 0.50 packs/day for 4 years    Types: Cigarettes    Quit date: 01/02/1988  . Smokeless tobacco: Never Used  . Alcohol Use: Yes  . Drug Use: No  . Sexual Activity: Not Asked   Other Topics Concern  . None   Social History Narrative   Review of Systems  Constitutional: Negative for fever and chills.  Gastrointestinal: Positive for nausea.  Genitourinary: Positive for flank pain.   Objective:  BP 150/92 mmHg  Pulse 80  Temp(Src) 98.2 F (36.8 C) (Oral)  Ht 5\' 3"  (1.6 m)  Wt 226 lb 8 oz (102.74 kg)  BMI 40.13 kg/m2  SpO2 98%  BP/Weight 01/01/2015 12/04/2014 99/05/7167  Systolic BP 678 938 101  Diastolic BP 92 74 80  Wt. (Lbs) 226.5 223.5 212.5  BMI 40.13 39.6 37.65   Physical Exam  Constitutional:  Patient appears in severe pain.  Cardiovascular: Normal rate and regular rhythm.   Pulmonary/Chest: Effort normal. No respiratory distress. She has no wheezes. She has no rales.  Abdominal: She exhibits no distension. There is no tenderness. There is no rebound and no  guarding.  Negative CVA tenderness.  Neurological: She is alert.  Vitals reviewed.  Results for orders placed or performed in visit on 01/01/15 (from the past 24 hour(s))  POCT Urinalysis Dipstick     Status: Abnormal   Collection Time: 01/01/15 12:07 PM  Result Value Ref Range   Color, UA yellow    Clarity, UA clear    Glucose, UA 100    Bilirubin, UA neg    Ketones, UA neg    Spec Grav, UA 1.020    Blood, UA neg    pH, UA 5.5    Protein, UA neg    Urobilinogen, UA 0.2    Nitrite, UA neg    Leukocytes, UA small (1+) (A) Negative   Assessment & Plan:   Problem List Items Addressed This Visit    Flank pain - Primary    History consistent with acute nephrolithiasis. Urinalysis was negative for blood (however 10-30% of patients do not have hematuria and this does not exclude stone). Treating with Percocet, Flomax, and Zofran. Advised her to call her urologist for close follow-up. Offered imaging but patient states that her prior CT revealed small stones that should be easily passed.      Relevant Medications   oxyCODONE-acetaminophen (PERCOCET/ROXICET) 5-325 MG tablet   ondansetron (ZOFRAN) 4 MG tablet   tamsulosin (FLOMAX) 0.4 MG CAPS capsule   Other Relevant  Orders   POCT Urinalysis Dipstick (Completed)   Urine Microscopic Only   Urine culture    Other Visit Diagnoses    History of nephrolithiasis        Relevant Orders    Urine Microscopic Only    Urine culture       Meds ordered this encounter  Medications  . oxyCODONE-acetaminophen (PERCOCET/ROXICET) 5-325 MG tablet    Sig: Take 1 tablet by mouth every 6 (six) hours as needed for severe pain.    Dispense:  30 tablet    Refill:  0  . ondansetron (ZOFRAN) 4 MG tablet    Sig: Take 1 tablet (4 mg total) by mouth every 8 (eight) hours as needed for nausea or vomiting.    Dispense:  30 tablet    Refill:  0  . tamsulosin (FLOMAX) 0.4 MG CAPS capsule    Sig: Take 1 capsule (0.4 mg total) by mouth daily.     Dispense:  30 capsule    Refill:  0    Follow-up: Patient to follow-up with her primary and urology. Advised her to call urologist soon as possible.  Thersa Salt, DO

## 2015-01-01 NOTE — Progress Notes (Signed)
Pre visit review using our clinic review tool, if applicable. No additional management support is needed unless otherwise documented below in the visit note. 

## 2015-01-01 NOTE — Patient Instructions (Signed)
Take the medications as prescribed.  Call Dr. Jacqlyn Larsen for an appt ASAP.  Follow up with your primary  Take care  Dr. Lacinda Axon

## 2015-01-01 NOTE — Assessment & Plan Note (Signed)
History consistent with acute nephrolithiasis. Urinalysis was negative for blood (however 10-30% of patients do not have hematuria and this does not exclude stone). Treating with Percocet, Flomax, and Zofran. Advised her to call her urologist for close follow-up. Offered imaging but patient states that her prior CT revealed small stones that should be easily passed.

## 2015-01-02 LAB — URINE CULTURE: Colony Count: 40000

## 2015-01-12 ENCOUNTER — Ambulatory Visit (INDEPENDENT_AMBULATORY_CARE_PROVIDER_SITE_OTHER): Payer: BLUE CROSS/BLUE SHIELD | Admitting: Obstetrics and Gynecology

## 2015-01-12 ENCOUNTER — Encounter: Payer: Self-pay | Admitting: Obstetrics and Gynecology

## 2015-01-12 VITALS — BP 119/80 | HR 80 | Ht 63.0 in | Wt 224.0 lb

## 2015-01-12 DIAGNOSIS — N912 Amenorrhea, unspecified: Secondary | ICD-10-CM | POA: Diagnosis not present

## 2015-01-12 DIAGNOSIS — Z124 Encounter for screening for malignant neoplasm of cervix: Secondary | ICD-10-CM | POA: Diagnosis not present

## 2015-01-12 DIAGNOSIS — D251 Intramural leiomyoma of uterus: Secondary | ICD-10-CM | POA: Diagnosis not present

## 2015-01-12 NOTE — Patient Instructions (Signed)
Uterine Artery Embolization for Fibroids Uterine artery embolization is a nonsurgical treatment to shrink fibroids. A thin plastic tube (catheter) is used to inject material that blocks off the blood supply to the fibroid, which causes the fibroid to shrink. LET YOUR HEALTH CARE PROVIDER KNOW ABOUT:  Any allergies you have.  All medicines you are taking, including vitamins, herbs, eye drops, creams, and over-the-counter medicines.  Previous problems you or members of your family have had with the use of anesthetics.  Any blood disorders you have.  Previous surgeries you have had.  Medical conditions you have. RISKS AND COMPLICATIONS  Injury to the uterus from decreased blood supply  Infection.  Blood infection (septicemia).  Lack of menstrual periods (amenorrhea).  Death of tissue cells (necrosis) around your bladder or vulva.  Development of a hole between organs or from an organ to the surface of your skin (fistula).  Blood clot in the legs (deep vein thrombosis) or lung (pulmonary embolus). BEFORE THE PROCEDURE  Ask your health care provider about changing or stopping your regular medicines.   Do not take aspirin or blood thinners (anticoagulants) for 1 week before the surgery or as directed by your health care provider.  Do not eat or drink anything for 8 hours before the surgery or as directed by your health care provider.   Empty your bladder before the procedure begins. PROCEDURE   An IV tube will be placed into one of your veins. This will be used to give you a sedative and pain medication (conscious sedation).  You will be given a medicine that numbs the area (local anesthetic).  A small cut will be made in your groin. A catheter is then inserted into the main artery of your leg.  The catheter will be guided through the artery to your uterus. A series of images will be taken while dye is injected through the catheter in your groin. X-rays are taken at the  same time. This is done to provide a road map of the blood supply to your uterus and fibroids.  Tiny plastic spheres, about the size of sand grains, will be injected through the catheter. Metal coils may be used to help block the artery. The particles will lodge in tiny branches of the uterine artery that supplies blood to the fibroids.  The procedure is repeated on the artery that supplies the other side of the uterus.  The catheter is then removed and pressure is held to stop any bleeding. No stitches are needed.  A dressing is then placed over the cut (incision). AFTER THE PROCEDURE  You will be taken to a recovery area where your progress will be monitored until you are awake, stable, and taking fluids well. If there are no other problems, you will then be moved to a regular hospital room.  You will be observed overnight in the hospital.  You will have cramping that should be controlled with pain medication.   This information is not intended to replace advice given to you by your health care provider. Make sure you discuss any questions you have with your health care provider.   Document Released: 05/05/2005 Document Revised: 12/08/2012 Document Reviewed: 09/02/2012 Elsevier Interactive Patient Education 2016 [employer]  

## 2015-01-13 ENCOUNTER — Encounter: Payer: Self-pay | Admitting: Obstetrics and Gynecology

## 2015-01-13 LAB — FSH/LH
FSH: 32.9 m[IU]/mL
LH: 19.7 m[IU]/mL

## 2015-01-13 LAB — ESTRADIOL: Estradiol: 11.1 pg/mL

## 2015-01-13 NOTE — Progress Notes (Signed)
GYNECOLOGY PROGRESS NOTE  Subjective:    Patient ID: NOEMY PELLERIN, female    DOB: [birth year], 49 y.o.   MRN: [id number]  HPI  Patient is a 49 y.o. P71 female who presents for discussion of management options for fibroid uterus.  Patient was seen ~ 1 year ago for fibroid uterus (5 cm, fundal) and DUB.  At that time was considering hysterectomy, but changed her mind due to issues with anesthesia in the past (notes difficulty waking back up after being put to sleep). Does have a history of endometrial ablation in 2008.  Notes that since that time her menstrual cycles have ceased (thinks she may be in menopause), however she is experiencing more pelvic pressure and urinary urge. Denies early satiety. Also notes that she was diagnosed later with sleep apnea, after anesthesia event.     Patient also desires pap smear today. Denies h/o abnormal pap smears.  Last pap smear 01/2013.     Past Medical History  Diagnosis Date  . Hypertension   . Heart murmur     recent diagnosed- / ehho 12/30/10 report on chart  . Headache(784.0)     h/x migraines  . Asthma   . Recurrent upper respiratory infection (URI)     occ sore throat and slightly stuffy- no fever or cough  . Hiatal hernia   . Arthritis     arhtritis middle back, scolosis  . Anemia     s/p ablation for heavy bleeding   . Sleep apnea     Past Surgical History  Procedure Laterality Date  . Cholecystectomy    . Tonsillectomy    . Fracture surgery    . Hardware removal  01/08/2011    Procedure: HARDWARE REMOVAL;  Surgeon: Laurice Record Aplington;  Location: [facility];  Service: Orthopedics;  Laterality: Left;  screw and plate left ankle and Fusion of Subtalor Joint Left Ankle    (Large c-arm)   . Endometrial ablation  2008     Family History  Problem Relation Age of Onset  . Diabetes Father     Social History  Substance Use Topics  . Smoking status: Former Smoker -- 0.50 packs/day for 4 years    Types: Cigarettes    Quit date:  01/02/1988  . Smokeless tobacco: Never Used  . Alcohol Use: Yes    Current Outpatient Prescriptions on File Prior to Visit  Medication Sig Dispense Refill  . citalopram (CELEXA) 40 MG tablet TAKE ONE TABLET BY MOUTH AT BEDTIME 90 tablet 0  . hydrochlorothiazide (HYDRODIURIL) 25 MG tablet Take 1 tablet (25 mg total) by mouth every morning. 90 tablet 1  . ibuprofen (ADVIL,MOTRIN) 200 MG tablet Take 200 mg by mouth every 6 (six) hours as needed. For pain    . omeprazole (PRILOSEC OTC) 20 MG tablet Take 20 mg by mouth 2 (two) times daily.     . ondansetron (ZOFRAN) 4 MG tablet Take 1 tablet (4 mg total) by mouth every 8 (eight) hours as needed for nausea or vomiting. 30 tablet 0  . oxyCODONE-acetaminophen (PERCOCET/ROXICET) 5-325 MG tablet Take 1 tablet by mouth every 6 (six) hours as needed for severe pain. 30 tablet 0  . tamsulosin (FLOMAX) 0.4 MG CAPS capsule Take 1 capsule (0.4 mg total) by mouth daily. 30 capsule 0  . vitamin E 400 UNIT capsule Take 800 Units by mouth at bedtime.     . predniSONE (DELTASONE) 20 MG tablet 3 tabs by mouth x 3 days, then 2 tabs  by mouth x 2 days, then 1 tab by mouth x 2 day, 1/2 tab by mouth x 1 day and stop (Patient not taking: Reported on 01/01/2015) 18 tablet 0   No current facility-administered medications on file prior to visit.    Allergies  Allergen Reactions  . Benadryl [Diphenhydramine Hcl] Other (See Comments)    Keeps awake  . Codeine Other (See Comments)    Keeps awake  . Dilaudid [Hydromorphone Hcl] Itching and Nausea Only  . Morphine And Related Itching and Nausea Only    Review of Systems Pertinent items noted in HPI and remainder of comprehensive ROS otherwise negative.   Objective:   Blood pressure 119/80, pulse 80, height 5\' 3"  (1.6 m), weight 224 lb (101.606 kg). Patient's last menstrual period was 11/22/2013. Body mass index is 39.69 kg/(m^2).   General appearance: alert and no distress, obese Abdomen: soft, non-tender; bowel  sounds normal; no masses,  no organomegaly Pelvic: cervix normal in appearance, external genitalia normal, no adnexal masses or tenderness, no cervical motion tenderness, positive findings: uterine enlargement or uterine fibroids palpable, rectovaginal septum normal and vagina normal without discharge.  Extremities: extremities normal, atraumatic, no cyanosis or edema Neurologic: Grossly normal   Assessment:   Uterine enlargement with fibroid Pressure symptoms Absent menses Pap smear desired  Plan:   - Discussed management options for fibroid uterus and pressure symptoms, including hysterectomy, UFE, hormonal treatment with Lupron/Depo Provera (although may not have as good of an effect if patient truly in menopause). Patient would like to have UFE.  Will refer to tertiary center for Interventional Radiology.  Patient would prefer to go to [facility].  - Will order pelvic ultrasound as patient with new pressure symptoms, as well as for desired procedure.  Uterine size difficult to palpate on exam due to body habitus, but is enlarged.  H/o 5 cm fundal fibroid on last year's ultrasound (performed 10/2013). RTC in 1 week for sono.  - Absent menses- will order labs to confirm menopausal status.  Discussed that fibroids not likely to return after procedure performed if truly in menopause, however current fibroid may not change much in size and may still have symptoms if no intervention performed.  Will notify patient by phone of results.  - Patient desires pap smear today as it has been over 1 year since last pap.  Advised on new screening guidelines, can space to q 3-5 years if no h/o prior abnormal paps. Discussed that it may or may not be covered by insurance. Patient notes understanding, however still desires pap.        A total of 25 minutes were spent face-to-face with the patient during this encounter and over half of that time involved counseling and coordination of care.   Rubie Maid,  MD [facility]

## 2015-01-17 ENCOUNTER — Other Ambulatory Visit: Payer: Self-pay

## 2015-01-17 MED ORDER — HYDROCHLOROTHIAZIDE 25 MG PO TABS: 25.0000 mg | ORAL_TABLET | ORAL | Status: DC

## 2015-01-17 NOTE — Telephone Encounter (Signed)
Pt left v/m requesting refill HCTZ to [facility] [address]. Last annual exam 01/09/14. rx last refilled # 90 x 1 on 07/25/14. Pt notified will refill x 1 and pt will cb to schedule annual appt before med is gone.

## 2015-01-18 LAB — PAP IG AND HPV HIGH-RISK
HPV, HIGH-RISK: NEGATIVE
PAP SMEAR COMMENT: 0

## 2015-01-19 ENCOUNTER — Telehealth: Payer: Self-pay

## 2015-01-19 ENCOUNTER — Ambulatory Visit: Payer: BLUE CROSS/BLUE SHIELD

## 2015-01-19 DIAGNOSIS — D251 Intramural leiomyoma of uterus: Secondary | ICD-10-CM

## 2015-01-19 NOTE — Telephone Encounter (Signed)
-----   Message from Rubie Maid, MD sent at 01/16/2015  5:09 PM EST ----- Please inform patient that based on labs it appears that she is newly menopausal.

## 2015-01-19 NOTE — Telephone Encounter (Signed)
LM for pt informing her of information below.

## 2015-01-24 ENCOUNTER — Telehealth: Payer: Self-pay | Admitting: Obstetrics and Gynecology

## 2015-01-24 NOTE — Telephone Encounter (Signed)
Contacted patient regarding ultrasound results,left voicemail message informing patient of fibroid uterus (which patient was aware of), no other masses or pathology present.

## 2015-02-05 ENCOUNTER — Other Ambulatory Visit: Payer: Self-pay | Admitting: Family Medicine

## 2015-02-06 ENCOUNTER — Telehealth: Payer: Self-pay

## 2015-02-06 NOTE — Telephone Encounter (Signed)
Pt newly menopausal, questions whether or not she needs to proceed with hysterectomy or management of current fibroid. Pt questions if fibroid will develop into cancer. Please advise

## 2015-02-06 NOTE — Telephone Encounter (Signed)
Please inform patient that it is her decision as to whether or not she desires to proceed with hysterectomy.  She is no longer having abnormal bleeding as she has gone through menopause, but she did still note pressure symptoms.  Typically after menopause the fibroid does not continue to grow, however she may still have occasional pressure symptoms due to its current size.  If this is manageable forher then she does not have to have a hysterectomy.  Reassure her that the fibroid will not develop into cancer if she does not have surgery.

## 2015-02-07 NOTE — Telephone Encounter (Signed)
Informed pt of the information below. PT desires to think about it the hysterectomy and call us back.

## 2015-03-12 ENCOUNTER — Ambulatory Visit (INDEPENDENT_AMBULATORY_CARE_PROVIDER_SITE_OTHER): Payer: BLUE CROSS/BLUE SHIELD | Admitting: Family Medicine

## 2015-03-12 ENCOUNTER — Encounter: Payer: Self-pay | Admitting: Family Medicine

## 2015-03-12 VITALS — BP 104/64 | HR 76 | Temp 97.8°F | Wt 223.0 lb

## 2015-03-12 DIAGNOSIS — Z01419 Encounter for gynecological examination (general) (routine) without abnormal findings: Secondary | ICD-10-CM

## 2015-03-12 DIAGNOSIS — E559 Vitamin D deficiency, unspecified: Secondary | ICD-10-CM

## 2015-03-12 DIAGNOSIS — Z0000 Encounter for general adult medical examination without abnormal findings: Secondary | ICD-10-CM | POA: Diagnosis not present

## 2015-03-12 DIAGNOSIS — I10 Essential (primary) hypertension: Secondary | ICD-10-CM

## 2015-03-12 DIAGNOSIS — M545 Low back pain, unspecified: Secondary | ICD-10-CM | POA: Insufficient documentation

## 2015-03-12 DIAGNOSIS — F329 Major depressive disorder, single episode, unspecified: Secondary | ICD-10-CM

## 2015-03-12 DIAGNOSIS — M5441 Lumbago with sciatica, right side: Secondary | ICD-10-CM

## 2015-03-12 DIAGNOSIS — F32A Depression, unspecified: Secondary | ICD-10-CM

## 2015-03-12 MED ORDER — CITALOPRAM HYDROBROMIDE 40 MG PO TABS: 40.0000 mg | ORAL_TABLET | Freq: Every day | ORAL | Status: DC

## 2015-03-12 MED ORDER — MELOXICAM 15 MG PO TABS: 15.0000 mg | ORAL_TABLET | Freq: Every day | ORAL | Status: DC

## 2015-03-12 NOTE — Assessment & Plan Note (Signed)
Well controlled on current rx. Due for labs today. Orders Placed This Encounter  Procedures  . Lipid panel  . Comprehensive metabolic panel  . TSH  . CBC with Differential/Platelet

## 2015-03-12 NOTE — Progress Notes (Signed)
Pre visit review using our clinic review tool, if applicable. No additional management support is needed unless otherwise documented below in the visit note. 

## 2015-03-12 NOTE — Progress Notes (Signed)
Subjective:   Patient ID: Sheri Hood, female    DOB: [birth year], 50 y.o.   MRN: [id number]  Sheri Hood is a pleasant 50 y.o. year old female who presents to clinic today with Follow-up  on 03/12/2015  HPI:  HTN- has been well controlled on HCTZ 25 mg daily. Denies any HA, blurred vision, CP or SOB. Lab Results  Component Value Date   CREATININE 0.84 01/09/2014    Lab Results  Component Value Date   CHOL 186 01/09/2014   HDL 47 01/09/2014   LDLCALC 112* 01/09/2014   TRIG 134 01/09/2014   CHOLHDL 4.0 01/09/2014    Depression- has been stable on current dose of celexa. Denies any symptoms of anxiety or depression.  Back pain- intermittent.  No known injury.  Intermittent sciatica.  No urinary symptoms.  Current Outpatient Prescriptions on File Prior to Visit  Medication Sig Dispense Refill  . hydrochlorothiazide (HYDRODIURIL) 25 MG tablet Take 1 tablet (25 mg total) by mouth every morning. 90 tablet 0  . ibuprofen (ADVIL,MOTRIN) 200 MG tablet Take 200 mg by mouth every 6 (six) hours as needed. For pain    . omeprazole (PRILOSEC OTC) 20 MG tablet Take 20 mg by mouth 2 (two) times daily.     . vitamin E 400 UNIT capsule Take 800 Units by mouth at bedtime.     . ondansetron (ZOFRAN) 4 MG tablet Take 1 tablet (4 mg total) by mouth every 8 (eight) hours as needed for nausea or vomiting. (Patient not taking: Reported on 03/12/2015) 30 tablet 0  . oxyCODONE-acetaminophen (PERCOCET/ROXICET) 5-325 MG tablet Take 1 tablet by mouth every 6 (six) hours as needed for severe pain. (Patient not taking: Reported on 03/12/2015) 30 tablet 0  . tamsulosin (FLOMAX) 0.4 MG CAPS capsule Take 1 capsule (0.4 mg total) by mouth daily. (Patient not taking: Reported on 03/12/2015) 30 capsule 0   No current facility-administered medications on file prior to visit.    Allergies  Allergen Reactions  . Benadryl [Diphenhydramine Hcl] Other (See Comments)    Keeps awake  . Codeine Other (See  Comments)    Keeps awake  . Dilaudid [Hydromorphone Hcl] Itching and Nausea Only  . Morphine And Related Itching and Nausea Only    Past Medical History  Diagnosis Date  . Hypertension   . Heart murmur     recent diagnosed- / ehho 12/30/10 report on chart  . Headache(784.0)     h/x migraines  . Asthma   . Recurrent upper respiratory infection (URI)     occ sore throat and slightly stuffy- no fever or cough  . Hiatal hernia   . Arthritis     arhtritis middle back, scolosis  . Anemia     s/p ablation for heavy bleeding   . Sleep apnea   . Sleep apnea     Past Surgical History  Procedure Laterality Date  . Cholecystectomy    . Tonsillectomy    . Fracture surgery    . Hardware removal  01/08/2011    Procedure: HARDWARE REMOVAL;  Surgeon: Laurice Record Aplington;  Location: [facility];  Service: Orthopedics;  Laterality: Left;  screw and plate left ankle and Fusion of Subtalor Joint Left Ankle    (Large c-arm)   . Endometrial ablation  2008    Family History  Problem Relation Age of Onset  . Diabetes Father     Social History   Social History  . Marital Status: Married  Spouse Name: N/A  . Number of Children: N/A  . Years of Education: N/A   Occupational History  . Not on file.   Social History Main Topics  . Smoking status: Former Smoker -- 0.50 packs/day for 4 years    Types: Cigarettes    Quit date: 01/02/1988  . Smokeless tobacco: Never Used  . Alcohol Use: 0.0 oz/week    0 Standard drinks or equivalent per week  . Drug Use: No  . Sexual Activity: No   Other Topics Concern  . Not on file   Social History Narrative   The PMH, PSH, Social History, Family History, Medications, and allergies have been reviewed in [facility], and have been updated if relevant.  Review of Systems  Musculoskeletal: Positive for back pain. Negative for myalgias, joint swelling, gait problem, neck pain and neck stiffness.  Skin: Negative.   Hematological: Negative.     Psychiatric/Behavioral: Negative.   All other systems reviewed and are negative.      Objective:    BP 104/64 mmHg  Pulse 76  Temp(Src) 97.8 F (36.6 C) (Oral)  Wt 223 lb (101.152 kg)   Physical Exam  Constitutional: She is oriented to person, place, and time. She appears well-developed and well-nourished. No distress.  HENT:  Head: Normocephalic.  Eyes: Conjunctivae are normal.  Cardiovascular: Normal rate.   Pulmonary/Chest: Effort normal.  Musculoskeletal:       Lumbar back: She exhibits tenderness, pain and spasm. She exhibits no bony tenderness, no swelling, no deformity and no laceration.  Neurological: She is alert and oriented to person, place, and time. No cranial nerve deficit.  Skin: Skin is dry. She is not diaphoretic.  Psychiatric: She has a normal mood and affect. Her behavior is normal. Judgment and thought content normal.  Nursing note and vitals reviewed.         Assessment & Plan:   Essential hypertension  Vitamin D deficiency  Well woman exam - Plan: CBC with Differential/Platelet, Comprehensive metabolic panel, Lipid panel, TSH  Bilateral low back pain with right-sided sciatica No Follow-up on file.

## 2015-03-12 NOTE — Assessment & Plan Note (Signed)
New-  For acute pain, rest, intermittent application of cold packs (later, may switch to heat, but do not sleep on heating pad), analgesics and muscle relaxants are recommended. Discussed longer term treatment plan of prn NSAID's and discussed a home back care exercise program with flexion exercise routine. Proper lifting with avoidance of heavy lifting discussed. Consider Physical Therapy and XRay studies if not improving. Call or return to clinic prn if these symptoms worsen or fail to improve as anticipated.

## 2015-03-12 NOTE — Assessment & Plan Note (Signed)
Well controlled on current rx.  eRx sent.

## 2015-03-12 NOTE — Patient Instructions (Signed)
Good to see you. Happy Birthday!  We will call you with your lab results.

## 2015-03-13 LAB — CBC WITH DIFFERENTIAL/PLATELET
BASOS: 0 %
Basophils Absolute: 0 10*3/uL (ref 0.0–0.2)
EOS (ABSOLUTE): 0.1 10*3/uL (ref 0.0–0.4)
EOS: 3 %
HEMATOCRIT: 41.4 % (ref 34.0–46.6)
Hemoglobin: 14.3 g/dL (ref 11.1–15.9)
IMMATURE GRANULOCYTES: 0 %
Immature Grans (Abs): 0 10*3/uL (ref 0.0–0.1)
LYMPHS ABS: 1.6 10*3/uL (ref 0.7–3.1)
Lymphs: 35 %
MCH: 29.5 pg (ref 26.6–33.0)
MCHC: 34.5 g/dL (ref 31.5–35.7)
MCV: 85 fL (ref 79–97)
MONOS ABS: 0.4 10*3/uL (ref 0.1–0.9)
Monocytes: 9 %
NEUTROS ABS: 2.5 10*3/uL (ref 1.4–7.0)
NEUTROS PCT: 53 %
Platelets: 168 10*3/uL (ref 150–379)
RBC: 4.85 x10E6/uL (ref 3.77–5.28)
RDW: 13.3 % (ref 12.3–15.4)
WBC: 4.7 10*3/uL (ref 3.4–10.8)

## 2015-03-13 LAB — COMPREHENSIVE METABOLIC PANEL
A/G RATIO: 2 (ref 1.1–2.5)
ALT: 56 IU/L — AB (ref 0–32)
AST: 39 IU/L (ref 0–40)
Albumin: 4.5 g/dL (ref 3.5–5.5)
Alkaline Phosphatase: 90 IU/L (ref 39–117)
BUN/Creatinine Ratio: 11 (ref 9–23)
BUN: 11 mg/dL (ref 6–24)
Bilirubin Total: 0.3 mg/dL (ref 0.0–1.2)
CO2: 25 mmol/L (ref 18–29)
Calcium: 9.7 mg/dL (ref 8.7–10.2)
Chloride: 96 mmol/L (ref 96–106)
Creatinine, Ser: 0.97 mg/dL (ref 0.57–1.00)
GFR, EST AFRICAN AMERICAN: 80 mL/min/{1.73_m2} (ref >59)
GFR, EST NON AFRICAN AMERICAN: 70 mL/min/{1.73_m2} (ref >59)
GLUCOSE: 228 mg/dL — AB (ref 65–99)
Globulin, Total: 2.3 g/dL (ref 1.5–4.5)
POTASSIUM: 3.2 mmol/L — AB (ref 3.5–5.2)
Sodium: 139 mmol/L (ref 134–144)
TOTAL PROTEIN: 6.8 g/dL (ref 6.0–8.5)

## 2015-03-13 LAB — LIPID PANEL
CHOL/HDL RATIO: 5.2 ratio — AB (ref 0.0–4.4)
Cholesterol, Total: 202 mg/dL — ABNORMAL HIGH (ref 100–199)
HDL: 39 mg/dL — ABNORMAL LOW (ref >39)
LDL CALC: 121 mg/dL — AB (ref 0–99)
TRIGLYCERIDES: 208 mg/dL — AB (ref 0–149)
VLDL Cholesterol Cal: 42 mg/dL — ABNORMAL HIGH (ref 5–40)

## 2015-03-13 LAB — TSH: TSH: 1.26 u[IU]/mL (ref 0.450–4.500)

## 2015-03-20 ENCOUNTER — Telehealth: Payer: Self-pay | Admitting: Family Medicine

## 2015-03-20 DIAGNOSIS — R7309 Other abnormal glucose: Secondary | ICD-10-CM

## 2015-03-20 NOTE — Telephone Encounter (Signed)
Lab test ordered and pt called

## 2015-03-20 NOTE — Telephone Encounter (Signed)
Patient called and the lab order for her lab work was suppose to be sent to [facility].  Patient called [facility] and they haven't received the order.  Patient was hoping to go for the lab work today.  Please call patient when order is sent to [facility].

## 2015-04-27 ENCOUNTER — Other Ambulatory Visit: Payer: Self-pay | Admitting: Family Medicine

## 2015-07-12 ENCOUNTER — Other Ambulatory Visit: Payer: Self-pay | Admitting: Family Medicine

## 2015-07-12 NOTE — Telephone Encounter (Signed)
Pt is requesting a refill. Pt was last here on 03/12/15.

## 2015-08-08 ENCOUNTER — Ambulatory Visit (INDEPENDENT_AMBULATORY_CARE_PROVIDER_SITE_OTHER): Payer: BLUE CROSS/BLUE SHIELD | Admitting: Internal Medicine

## 2015-08-08 ENCOUNTER — Encounter: Payer: Self-pay | Admitting: Internal Medicine

## 2015-08-08 ENCOUNTER — Ambulatory Visit (INDEPENDENT_AMBULATORY_CARE_PROVIDER_SITE_OTHER)
Admission: RE | Admit: 2015-08-08 | Discharge: 2015-08-08 | Disposition: A | Discharge status: Home / Self Care | Payer: BLUE CROSS/BLUE SHIELD | Source: Ambulatory Visit | Attending: Internal Medicine | Admitting: Internal Medicine

## 2015-08-08 VITALS — BP 110/82 | HR 72 | Temp 98.1°F | Wt 220.0 lb

## 2015-08-08 DIAGNOSIS — M79672 Pain in left foot: Secondary | ICD-10-CM | POA: Diagnosis not present

## 2015-08-08 DIAGNOSIS — M19072 Primary osteoarthritis, left ankle and foot: Secondary | ICD-10-CM | POA: Diagnosis not present

## 2015-08-08 NOTE — Progress Notes (Signed)
Subjective:    Patient ID: Sheri Hood, female    DOB: 06/27/1965, 50 y.o.   MRN: [id number]  HPI Here due to pain left foot  While going out to bring out recycling --had sudden pain when stepping down No twisting  Pain shooting up lateral foot Limped back into the house Tried elevation but no help with pain Really bad later when walking in house  Today it hurts about the same Hasn't iced Uses meloxicam already for hip--- still on this  Current Outpatient Prescriptions on File Prior to Visit  Medication Sig Dispense Refill  . citalopram (CELEXA) 40 MG tablet Take 1 tablet (40 mg total) by mouth at bedtime. Office visit required for any additional refills 90 tablet 3  . hydrochlorothiazide (HYDRODIURIL) 25 MG tablet TAKE ONE TABLET BY MOUTH IN THE MORNING 90 tablet 3  . ibuprofen (ADVIL,MOTRIN) 200 MG tablet Take 200 mg by mouth every 6 (six) hours as needed. For pain    . meloxicam (MOBIC) 15 MG tablet TAKE ONE TABLET BY MOUTH ONCE DAILY 30 tablet 0  . omeprazole (PRILOSEC OTC) 20 MG tablet Take 20 mg by mouth 2 (two) times daily.     . vitamin E 400 UNIT capsule Take 800 Units by mouth at bedtime.      No current facility-administered medications on file prior to visit.    Allergies  Allergen Reactions  . Benadryl [Diphenhydramine Hcl] Other (See Comments)    Keeps awake  . Codeine Other (See Comments)    Keeps awake  . Dilaudid [Hydromorphone Hcl] Itching and Nausea Only  . Morphine And Related Itching and Nausea Only    Past Medical History  Diagnosis Date  . Hypertension   . Heart murmur     recent diagnosed- / ehho 12/30/10 report on chart  . Headache(784.0)     h/x migraines  . Asthma   . Recurrent upper respiratory infection (URI)     occ sore throat and slightly stuffy- no fever or cough  . Hiatal hernia   . Arthritis     arhtritis middle back, scolosis  . Anemia     s/p ablation for heavy bleeding   . Sleep apnea   . Sleep apnea     Past  Surgical History  Procedure Laterality Date  . Cholecystectomy    . Tonsillectomy    . Fracture surgery    . Hardware removal  01/08/2011    Procedure: HARDWARE REMOVAL;  Surgeon: Laurice Record Aplington;  Location: [facility];  Service: Orthopedics;  Laterality: Left;  screw and plate left ankle and Fusion of Subtalor Joint Left Ankle    (Large c-arm)   . Endometrial ablation  2008    Family History  Problem Relation Age of Onset  . Diabetes Father     Social History   Social History  . Marital Status: Married    Spouse Name: N/A  . Number of Children: N/A  . Years of Education: N/A   Occupational History  . Not on file.   Social History Main Topics  . Smoking status: Former Smoker -- 0.50 packs/day for 4 years    Types: Cigarettes    Quit date: 01/02/1988  . Smokeless tobacco: Never Used  . Alcohol Use: 0.0 oz/week    0 Standard drinks or equivalent per week  . Drug Use: No  . Sexual Activity: No   Other Topics Concern  . Not on file   Social History Narrative   Review  of Systems  No other joint swelling Did break that left ankle years ago--surgery twice, last 5 years ago Will have some pain if up on it a lot (but nothing different yesterday) No gout history     Objective:   Physical Exam  Musculoskeletal:  No left foot swelling Ankle is non tender and normal ROM passively Some tenderness at base of 4th and 5th metatarsals--but not striking Toes and MTPs are quiet          Assessment & Plan:

## 2015-08-08 NOTE — Progress Notes (Signed)
Pre visit review using our clinic review tool, if applicable. No additional management support is needed unless otherwise documented below in the visit note. 

## 2015-08-08 NOTE — Assessment & Plan Note (Addendum)
No clear inciting injury but past fracture and then removal of hardware Limping but exam not that worrisome X-ray is okay--got report She has boot to use at home--but has been walking on it Ice prn and elevation

## 2015-09-03 ENCOUNTER — Other Ambulatory Visit: Payer: Self-pay | Admitting: Family Medicine

## 2015-09-03 DIAGNOSIS — Z1231 Encounter for screening mammogram for malignant neoplasm of breast: Secondary | ICD-10-CM

## 2015-09-17 ENCOUNTER — Other Ambulatory Visit: Payer: Self-pay | Admitting: Family Medicine

## 2015-09-17 ENCOUNTER — Ambulatory Visit
Admission: RE | Admit: 2015-09-17 | Discharge: 2015-09-17 | Disposition: A | Discharge status: Home / Self Care | Payer: BLUE CROSS/BLUE SHIELD | Source: Ambulatory Visit | Attending: Family Medicine | Admitting: Family Medicine

## 2015-09-17 DIAGNOSIS — Z1231 Encounter for screening mammogram for malignant neoplasm of breast: Secondary | ICD-10-CM | POA: Diagnosis not present

## 2015-10-16 DIAGNOSIS — G4733 Obstructive sleep apnea (adult) (pediatric): Secondary | ICD-10-CM | POA: Diagnosis not present

## 2015-11-28 ENCOUNTER — Ambulatory Visit (INDEPENDENT_AMBULATORY_CARE_PROVIDER_SITE_OTHER): Payer: BLUE CROSS/BLUE SHIELD | Admitting: Family Medicine

## 2015-11-28 ENCOUNTER — Encounter: Payer: Self-pay | Admitting: Family Medicine

## 2015-11-28 VITALS — BP 122/72 | HR 77 | Temp 98.1°F | Wt 215.5 lb

## 2015-11-28 DIAGNOSIS — F329 Major depressive disorder, single episode, unspecified: Secondary | ICD-10-CM | POA: Diagnosis not present

## 2015-11-28 DIAGNOSIS — I10 Essential (primary) hypertension: Secondary | ICD-10-CM

## 2015-11-28 DIAGNOSIS — R300 Dysuria: Secondary | ICD-10-CM | POA: Diagnosis not present

## 2015-11-28 DIAGNOSIS — R81 Glycosuria: Secondary | ICD-10-CM | POA: Diagnosis not present

## 2015-11-28 DIAGNOSIS — F32A Depression, unspecified: Secondary | ICD-10-CM

## 2015-11-28 MED ORDER — CITALOPRAM HYDROBROMIDE 40 MG PO TABS: 40.0000 mg | ORAL_TABLET | Freq: Every day | ORAL | 3 refills | Status: DC

## 2015-11-28 MED ORDER — HYDROCHLOROTHIAZIDE 25 MG PO TABS: 25.0000 mg | ORAL_TABLET | Freq: Every morning | ORAL | 3 refills | Status: DC

## 2015-11-28 MED ORDER — LULICONAZOLE 1 % EX CREA: TOPICAL_CREAM | CUTANEOUS | 0 refills | Status: DC

## 2015-11-28 NOTE — Addendum Note (Signed)
Addended by: Marchia Bond on: 11/28/2015 09:26 AM   Modules accepted: Orders

## 2015-11-28 NOTE — Progress Notes (Signed)
Subjective:   Patient ID: Roland Earl, female    DOB: [birth year], 50 y.o.   MRN: [id number]  AMERAH BLOMGREN is a pleasant 50 y.o. year old female who presents to clinic today with Follow-up; Medication Refill; and Dysuria  on 11/28/2015  HPI:  HTN- has been well controlled on HCTZ 25 mg daily. Denies any HA, blurred vision, CP or SOB. Lab Results  Component Value Date   CREATININE 0.97 03/12/2015    Lab Results  Component Value Date   CHOL 202 (H) 03/12/2015   HDL 39 (L) 03/12/2015   LDLCALC 121 (H) 03/12/2015   TRIG 208 (H) 03/12/2015   CHOLHDL 5.2 (H) 03/12/2015    Depression- has been stable on current dose of celexa. Denies any symptoms of anxiety or depression.  Dysuria-  complains of urinary frequency, urgency and dysuria x 7 days, without flank pain, fever, chills, or abnormal vaginal discharge or bleeding.   Current Outpatient Prescriptions on File Prior to Visit  Medication Sig Dispense Refill  . citalopram (CELEXA) 40 MG tablet Take 1 tablet (40 mg total) by mouth at bedtime. Office visit required for any additional refills 90 tablet 3  . hydrochlorothiazide (HYDRODIURIL) 25 MG tablet TAKE ONE TABLET BY MOUTH IN THE MORNING 90 tablet 3  . ibuprofen (ADVIL,MOTRIN) 200 MG tablet Take 200 mg by mouth every 6 (six) hours as needed. For pain    . meloxicam (MOBIC) 15 MG tablet TAKE ONE TABLET BY MOUTH ONCE DAILY 30 tablet 0  . omeprazole (PRILOSEC OTC) 20 MG tablet Take 20 mg by mouth 2 (two) times daily.     . vitamin E 400 UNIT capsule Take 800 Units by mouth at bedtime.      No current facility-administered medications on file prior to visit.     Allergies  Allergen Reactions  . Benadryl [Diphenhydramine Hcl] Other (See Comments)    Keeps awake  . Codeine Other (See Comments)    Keeps awake  . Dilaudid [Hydromorphone Hcl] Itching and Nausea Only  . Morphine And Related Itching and Nausea Only    Past Medical History:  Diagnosis Date  . Anemia    s/p ablation for heavy bleeding   . Arthritis    arhtritis middle back, scolosis  . Asthma   . Headache(784.0)    h/x migraines  . Heart murmur    recent diagnosed- / ehho 12/30/10 report on chart  . Hiatal hernia   . Hypertension   . Recurrent upper respiratory infection (URI)    occ sore throat and slightly stuffy- no fever or cough  . Sleep apnea   . Sleep apnea     Past Surgical History:  Procedure Laterality Date  . CHOLECYSTECTOMY    . ENDOMETRIAL ABLATION  2008  . FRACTURE SURGERY    . HARDWARE REMOVAL  01/08/2011   Procedure: HARDWARE REMOVAL;  Surgeon: Laurice Record Aplington;  Location: [facility];  Service: Orthopedics;  Laterality: Left;  screw and plate left ankle and Fusion of Subtalor Joint Left Ankle    (Large c-arm)   . TONSILLECTOMY      Family History  Problem Relation Age of Onset  . Diabetes Father     Social History   Social History  . Marital status: Married    Spouse name: N/A  . Number of children: N/A  . Years of education: N/A   Occupational History  . Not on file.   Social History Main Topics  . Smoking status: Former  Smoker    Packs/day: 0.50    Years: 4.00    Types: Cigarettes    Quit date: 01/02/1988  . Smokeless tobacco: Never Used  . Alcohol use 0.0 oz/week  . Drug use: No  . Sexual activity: No   Other Topics Concern  . Not on file   Social History Narrative  . No narrative on file   The PMH, PSH, Social History, Family History, Medications, and allergies have been reviewed in [facility], and have been updated if relevant.  Review of Systems  Constitutional: Negative.   Respiratory: Negative.   Cardiovascular: Negative.   Gastrointestinal: Negative.   Genitourinary: Positive for dysuria and frequency. Negative for decreased urine volume, flank pain, urgency and vaginal bleeding.  Musculoskeletal: Negative.   Neurological: Negative.   Hematological: Negative.   Psychiatric/Behavioral: Negative.   All other systems reviewed and are  negative.       Objective:    BP 122/72   Pulse 77   Temp 98.1 F (36.7 C) (Oral)   Wt 215 lb 8 oz (97.8 kg)   SpO2 98%   BMI 38.17 kg/m    Physical Exam  Constitutional: She is oriented to person, place, and time. She appears well-developed and well-nourished. No distress.  HENT:  Head: Normocephalic.  Eyes: Conjunctivae are normal.  Cardiovascular: Normal rate.   Pulmonary/Chest: Effort normal.  Abd:  Soft, NT, no CVA or suprapubic tenderness Neurological: She is alert and oriented to person, place, and time. No cranial nerve deficit.  Skin: Skin is dry. She is not diaphoretic.  Psychiatric: She has a normal mood and affect. Her behavior is normal. Judgment and thought content normal.  Nursing note and vitals reviewed.         Assessment & Plan:   Essential hypertension  Depression  Dysuria No Follow-up on file.

## 2015-11-28 NOTE — Assessment & Plan Note (Signed)
Continue current dose of celexa. No changes made. eRx sent.

## 2015-11-28 NOTE — Assessment & Plan Note (Signed)
UA pos for glucose only. She is not a known diabetic. Will check a1c.

## 2015-11-29 ENCOUNTER — Other Ambulatory Visit: Payer: Self-pay | Admitting: Family Medicine

## 2015-11-29 ENCOUNTER — Encounter: Payer: Self-pay | Admitting: Family Medicine

## 2015-11-29 LAB — POC URINALSYSI DIPSTICK (AUTOMATED)
BILIRUBIN UA: NEGATIVE
Blood, UA: NEGATIVE
Glucose, UA: NEGATIVE
Ketones, UA: NEGATIVE
NITRITE UA: NEGATIVE
PH UA: 6
Protein, UA: NEGATIVE
Spec Grav, UA: >=1.03
Urobilinogen, UA: 0.2

## 2015-11-29 LAB — HEMOGLOBIN A1C
Est. average glucose Bld gHb Est-mCnc: 203 mg/dL
HEMOGLOBIN A1C: 8.7 % — AB (ref 4.8–5.6)

## 2015-11-29 MED ORDER — METFORMIN HCL 500 MG PO TABS: 500.0000 mg | ORAL_TABLET | Freq: Two times a day (BID) | ORAL | 1 refills | Status: DC

## 2015-11-29 NOTE — Addendum Note (Signed)
Addended by: Modena Nunnery on: 11/29/2015 11:49 AM   Modules accepted: Orders

## 2015-12-04 ENCOUNTER — Ambulatory Visit (INDEPENDENT_AMBULATORY_CARE_PROVIDER_SITE_OTHER): Payer: BLUE CROSS/BLUE SHIELD | Admitting: Family Medicine

## 2015-12-04 ENCOUNTER — Encounter: Payer: Self-pay | Admitting: Family Medicine

## 2015-12-04 DIAGNOSIS — E119 Type 2 diabetes mellitus without complications: Secondary | ICD-10-CM

## 2015-12-04 NOTE — Patient Instructions (Signed)
Great to see you. Please start taking your Metformin- 1 tablet with supper until you can tolerate it a little better. Please come see me in 3 months.  We will call you with your diabetic teaching referral.

## 2015-12-04 NOTE — Progress Notes (Signed)
Pre visit review using our clinic review tool, if applicable. No additional management support is needed unless otherwise documented below in the visit note. 

## 2015-12-04 NOTE — Progress Notes (Signed)
Subjective:   Patient ID: Sheri Hood, female    DOB: 10-Jan-1966, 50 y.o.   MRN: [id number]  Sheri Hood is a pleasant 50 y.o. year old female who presents to clinic today with Follow-up (discuss DM Tx)  on 12/04/2015  HPI:  Saw her on 11/28/15.  Had some urinary symptoms. UA pos for glucose. a1c was elevated as expected, diagnosed with new onset DM.  Lab Results  Component Value Date   HGBA1C 8.7 (H) 11/28/2015   eRx sent for Metformin 500 mg twice daily. She is having some nausea and feels shaky.  No diarrhea.  Current Outpatient Prescriptions on File Prior to Visit  Medication Sig Dispense Refill  . citalopram (CELEXA) 40 MG tablet Take 1 tablet (40 mg total) by mouth at bedtime. 90 tablet 3  . hydrochlorothiazide (HYDRODIURIL) 25 MG tablet Take 1 tablet (25 mg total) by mouth every morning. 90 tablet 3  . ibuprofen (ADVIL,MOTRIN) 200 MG tablet Take 200 mg by mouth every 6 (six) hours as needed. For pain    . Luliconazole 1 % CREA Apply once daily as needed 60 g 0  . meloxicam (MOBIC) 15 MG tablet TAKE ONE TABLET BY MOUTH ONCE DAILY 30 tablet 0  . metFORMIN (GLUCOPHAGE) 500 MG tablet Take 1 tablet (500 mg total) by mouth 2 (two) times daily with a meal. 60 tablet 1  . omeprazole (PRILOSEC OTC) 20 MG tablet Take 20 mg by mouth 2 (two) times daily.     . vitamin E 400 UNIT capsule Take 800 Units by mouth at bedtime.      No current facility-administered medications on file prior to visit.     Allergies  Allergen Reactions  . Benadryl [Diphenhydramine Hcl] Other (See Comments)    Keeps awake  . Codeine Other (See Comments)    Keeps awake  . Dilaudid [Hydromorphone Hcl] Itching and Nausea Only  . Morphine And Related Itching and Nausea Only    Past Medical History:  Diagnosis Date  . Anemia    s/p ablation for heavy bleeding   . Arthritis    arhtritis middle back, scolosis  . Asthma   . Headache(784.0)    h/x migraines  . Heart murmur    recent diagnosed-  / ehho 12/30/10 report on chart  . Hiatal hernia   . Hypertension   . Recurrent upper respiratory infection (URI)    occ sore throat and slightly stuffy- no fever or cough  . Sleep apnea   . Sleep apnea     Past Surgical History:  Procedure Laterality Date  . CHOLECYSTECTOMY    . ENDOMETRIAL ABLATION  2008  . FRACTURE SURGERY    . HARDWARE REMOVAL  01/08/2011   Procedure: HARDWARE REMOVAL;  Surgeon: Laurice Record Aplington;  Location: [facility];  Service: Orthopedics;  Laterality: Left;  screw and plate left ankle and Fusion of Subtalor Joint Left Ankle    (Large c-arm)   . TONSILLECTOMY      Family History  Problem Relation Age of Onset  . Diabetes Father     Social History   Social History  . Marital status: Married    Spouse name: N/A  . Number of children: N/A  . Years of education: N/A   Occupational History  . Not on file.   Social History Main Topics  . Smoking status: Former Smoker    Packs/day: 0.50    Years: 4.00    Types: Cigarettes    Quit date:  01/02/1988  . Smokeless tobacco: Never Used  . Alcohol use 0.0 oz/week  . Drug use: No  . Sexual activity: No   Other Topics Concern  . Not on file   Social History Narrative  . No narrative on file   The PMH, PSH, Social History, Family History, Medications, and allergies have been reviewed in [facility], and have been updated if relevant.   Review of Systems  Eyes: Negative.   Respiratory: Negative.   Gastrointestinal: Positive for nausea. Negative for diarrhea, rectal pain and vomiting.  Endocrine: Negative.   Genitourinary: Positive for frequency.  Neurological: Positive for weakness.  Psychiatric/Behavioral: Negative.        Objective:    BP 138/80   Pulse 75   Temp 97.8 F (36.6 C) (Oral)   Wt 214 lb 8 oz (97.3 kg)   SpO2 97%   BMI 38.00 kg/m  Wt Readings from Last 3 Encounters:  12/04/15 214 lb 8 oz (97.3 kg)  11/28/15 215 lb 8 oz (97.8 kg)  08/08/15 220 lb (99.8 kg)     Physical Exam    Constitutional: She is oriented to person, place, and time. She appears well-developed and well-nourished. No distress.  HENT:  Head: Normocephalic and atraumatic.  Eyes: Conjunctivae are normal.  Cardiovascular: Normal rate.   Pulmonary/Chest: Effort normal.  Musculoskeletal: Normal range of motion.  Neurological: She is alert and oriented to person, place, and time. No cranial nerve deficit.  Skin: Skin is warm and dry. She is not diaphoretic.  Psychiatric: She has a normal mood and affect. Her behavior is normal. Judgment and thought content normal.  Nursing note and vitals reviewed.         Assessment & Plan:   New onset type 2 diabetes mellitus ([place]) - Plan: Ambulatory referral to diabetic education No Follow-up on file.

## 2015-12-04 NOTE — Assessment & Plan Note (Addendum)
>  25 minutes spent in face to face time with patient, >50% spent in counselling or coordination of care concerning new onset diabetes.  New onset. Advised to try taking Metformin 500 mg daily only, instead of twice daily until she can tolerate it better. Refer to diabetic teaching. Follow up in 3 months.

## 2016-02-19 ENCOUNTER — Telehealth: Payer: Self-pay | Admitting: Family Medicine

## 2016-02-19 NOTE — Telephone Encounter (Signed)
Noted! Thank you

## 2016-02-19 NOTE — Telephone Encounter (Signed)
Have been trying to contact the patient to ask if she doesn't want to [facility]. She has not returned our calls or the [facility] calls to schedule. We are cancelling the referral at this time. Patient has followup with you on 03/05/16

## 2016-02-26 ENCOUNTER — Encounter: Payer: Self-pay | Admitting: Family Medicine

## 2016-03-05 ENCOUNTER — Encounter: Payer: Self-pay | Admitting: Family Medicine

## 2016-03-05 ENCOUNTER — Ambulatory Visit (INDEPENDENT_AMBULATORY_CARE_PROVIDER_SITE_OTHER): Payer: BLUE CROSS/BLUE SHIELD | Admitting: Family Medicine

## 2016-03-05 VITALS — BP 126/62 | HR 63 | Temp 98.0°F | Wt 207.2 lb

## 2016-03-05 DIAGNOSIS — E119 Type 2 diabetes mellitus without complications: Secondary | ICD-10-CM

## 2016-03-05 MED ORDER — METFORMIN HCL 500 MG PO TABS
500.0000 mg | ORAL_TABLET | Freq: Every day | ORAL | 3 refills | Status: DC
Start: 2016-03-05 — End: 2016-03-06

## 2016-03-05 NOTE — Patient Instructions (Signed)
Great to see you.  We will call you with your results.  Happy New Year!

## 2016-03-05 NOTE — Progress Notes (Signed)
Subjective:   Patient ID: Sheri Hood, female    DOB: 1966/03/01, 51 y.o.   MRN: [id number]  Sheri Hood is a pleasant 51 y.o. year old female who presents to clinic today with Follow-up (DM)  on 03/05/2016  HPI:  Diabetes- diagnosed in 11/2015 Lab Results  Component Value Date   HGBA1C 8.7 (H) 11/28/2015   Started her on Metformin 500 mg twice daily, advised that she can try taking it daily initially as she was having some "shakiness" with it.  Referred to diabetic teaching which she did not attend.She will be going to [place] this month.  Has changed her diet- has lost weight.  Feeling much better. Quit drinking sodas.  Lab Results  Component Value Date   CHOL 202 (H) 03/12/2015   HDL 39 (L) 03/12/2015   LDLCALC 121 (H) 03/12/2015   TRIG 208 (H) 03/12/2015   CHOLHDL 5.2 (H) 03/12/2015     Current Outpatient Prescriptions on File Prior to Visit  Medication Sig Dispense Refill  . citalopram (CELEXA) 40 MG tablet Take 1 tablet (40 mg total) by mouth at bedtime. 90 tablet 3  . hydrochlorothiazide (HYDRODIURIL) 25 MG tablet Take 1 tablet (25 mg total) by mouth every morning. 90 tablet 3  . ibuprofen (ADVIL,MOTRIN) 200 MG tablet Take 200 mg by mouth every 6 (six) hours as needed. For pain    . Luliconazole 1 % CREA Apply once daily as needed 60 g 0  . metFORMIN (GLUCOPHAGE) 500 MG tablet Take 1 tablet (500 mg total) by mouth 2 (two) times daily with a meal. (Patient taking differently: Take 500 mg by mouth daily. ) 60 tablet 1  . omeprazole (PRILOSEC OTC) 20 MG tablet Take 20 mg by mouth 2 (two) times daily.     . vitamin E 400 UNIT capsule Take 800 Units by mouth at bedtime.      No current facility-administered medications on file prior to visit.     Allergies  Allergen Reactions  . Benadryl [Diphenhydramine Hcl] Other (See Comments)    Keeps awake  . Codeine Other (See Comments)    Keeps awake  . Dilaudid [Hydromorphone Hcl] Itching and Nausea Only  .  Morphine And Related Itching and Nausea Only    Past Medical History:  Diagnosis Date  . Anemia    s/p ablation for heavy bleeding   . Arthritis    arhtritis middle back, scolosis  . Asthma   . Headache(784.0)    h/x migraines  . Heart murmur    recent diagnosed- / ehho 12/30/10 report on chart  . Hiatal hernia   . Hypertension   . Recurrent upper respiratory infection (URI)    occ sore throat and slightly stuffy- no fever or cough  . Sleep apnea   . Sleep apnea     Past Surgical History:  Procedure Laterality Date  . CHOLECYSTECTOMY    . ENDOMETRIAL ABLATION  2008  . FRACTURE SURGERY    . HARDWARE REMOVAL  01/08/2011   Procedure: HARDWARE REMOVAL;  Surgeon: Laurice Record Aplington;  Location: [facility];  Service: Orthopedics;  Laterality: Left;  screw and plate left ankle and Fusion of Subtalor Joint Left Ankle    (Large c-arm)   . TONSILLECTOMY      Family History  Problem Relation Age of Onset  . Diabetes Father     Social History   Social History  . Marital status: Married    Spouse name: N/A  . Number  of children: N/A  . Years of education: N/A   Occupational History  . Not on file.   Social History Main Topics  . Smoking status: Former Smoker    Packs/day: 0.50    Years: 4.00    Types: Cigarettes    Quit date: 01/02/1988  . Smokeless tobacco: Never Used  . Alcohol use 0.0 oz/week  . Drug use: No  . Sexual activity: No   Other Topics Concern  . Not on file   Social History Narrative  . No narrative on file   The PMH, PSH, Social History, Family History, Medications, and allergies have been reviewed in [facility], and have been updated if relevant.   Review of Systems  Constitutional: Negative.   Musculoskeletal: Negative.   Allergic/Immunologic: Negative.   Neurological: Negative.   Hematological: Negative.   Psychiatric/Behavioral: Negative.   All other systems reviewed and are negative.      Objective:    BP 126/62   Pulse 63   Temp 98 F (36.7  C) (Oral)   Wt 207 lb 4 oz (94 kg)   SpO2 99%   BMI 36.71 kg/m   Wt Readings from Last 3 Encounters:  03/05/16 207 lb 4 oz (94 kg)  12/04/15 214 lb 8 oz (97.3 kg)  11/28/15 215 lb 8 oz (97.8 kg)     Physical Exam  Constitutional: She is oriented to person, place, and time. She appears well-developed and well-nourished. No distress.  HENT:  Head: Normocephalic.  Eyes: Conjunctivae are normal.  Cardiovascular: Normal rate and regular rhythm.   Pulmonary/Chest: Effort normal and breath sounds normal.  Neurological: She is alert and oriented to person, place, and time. No cranial nerve deficit.  Skin: Skin is warm and dry. She is not diaphoretic.  Psychiatric: She has a normal mood and affect. Her behavior is normal. Judgment and thought content normal.  Nursing note and vitals reviewed.         Assessment & Plan:   New onset type 2 diabetes mellitus ([place]) - Plan: Hemoglobin A1c, Lipid panel, Comprehensive metabolic panel, Microalbumin / creatinine urine ratio No Follow-up on file.

## 2016-03-05 NOTE — Assessment & Plan Note (Signed)
Doing well. Has lost weight, changed diet. Will check labs today. The patient indicates understanding of these issues and agrees with the plan.

## 2016-03-06 ENCOUNTER — Encounter: Payer: Self-pay | Admitting: Family Medicine

## 2016-03-06 ENCOUNTER — Other Ambulatory Visit: Payer: Self-pay | Admitting: Family Medicine

## 2016-03-06 LAB — COMPREHENSIVE METABOLIC PANEL
A/G RATIO: 1.9 (ref 1.2–2.2)
ALBUMIN: 4.8 g/dL (ref 3.5–5.5)
ALT: 60 IU/L — ABNORMAL HIGH (ref 0–32)
AST: 38 IU/L (ref 0–40)
Alkaline Phosphatase: 76 IU/L (ref 39–117)
BUN/Creatinine Ratio: 24 — ABNORMAL HIGH (ref 9–23)
BUN: 16 mg/dL (ref 6–24)
Bilirubin Total: 0.3 mg/dL (ref 0.0–1.2)
CO2: 27 mmol/L (ref 18–29)
CREATININE: 0.66 mg/dL (ref 0.57–1.00)
Calcium: 9.4 mg/dL (ref 8.7–10.2)
Chloride: 98 mmol/L (ref 96–106)
GFR calc non Af Amer: 105 mL/min/{1.73_m2} (ref >59)
GFR, EST AFRICAN AMERICAN: 121 mL/min/{1.73_m2} (ref >59)
GLOBULIN, TOTAL: 2.5 g/dL (ref 1.5–4.5)
Glucose: 107 mg/dL — ABNORMAL HIGH (ref 65–99)
Potassium: 3.6 mmol/L (ref 3.5–5.2)
SODIUM: 141 mmol/L (ref 134–144)
TOTAL PROTEIN: 7.3 g/dL (ref 6.0–8.5)

## 2016-03-06 LAB — LIPID PANEL
CHOLESTEROL TOTAL: 232 mg/dL — AB (ref 100–199)
Chol/HDL Ratio: 5.3 ratio units — ABNORMAL HIGH (ref 0.0–4.4)
HDL: 44 mg/dL (ref >39)
LDL Calculated: 123 mg/dL — ABNORMAL HIGH (ref 0–99)
TRIGLYCERIDES: 325 mg/dL — AB (ref 0–149)
VLDL Cholesterol Cal: 65 mg/dL — ABNORMAL HIGH (ref 5–40)

## 2016-03-06 LAB — MICROALBUMIN / CREATININE URINE RATIO
Creatinine, Urine: 96.4 mg/dL
MICROALBUM., U, RANDOM: 13.1 ug/mL
Microalb/Creat Ratio: 13.6 mg/g creat (ref 0.0–30.0)

## 2016-03-06 LAB — HEMOGLOBIN A1C
ESTIMATED AVERAGE GLUCOSE: 154 mg/dL
Hgb A1c MFr Bld: 7 % — ABNORMAL HIGH (ref 4.8–5.6)

## 2016-03-06 MED ORDER — METFORMIN HCL 500 MG PO TABS: 500.0000 mg | ORAL_TABLET | Freq: Two times a day (BID) | ORAL | 3 refills | Status: DC

## 2016-05-26 ENCOUNTER — Encounter: Payer: Self-pay | Admitting: Family Medicine

## 2016-05-26 LAB — HM DIABETES EYE EXAM

## 2016-05-30 ENCOUNTER — Encounter: Payer: Self-pay | Admitting: Family Medicine

## 2016-06-13 ENCOUNTER — Other Ambulatory Visit: Payer: Self-pay | Admitting: Family Medicine

## 2016-06-13 DIAGNOSIS — E119 Type 2 diabetes mellitus without complications: Secondary | ICD-10-CM | POA: Diagnosis not present

## 2016-06-14 LAB — LIPID PANEL W/O CHOL/HDL RATIO
CHOLESTEROL TOTAL: 196 mg/dL (ref 100–199)
HDL: 45 mg/dL (ref >39)
LDL Calculated: 128 mg/dL — ABNORMAL HIGH (ref 0–99)
Triglycerides: 114 mg/dL (ref 0–149)
VLDL CHOLESTEROL CAL: 23 mg/dL (ref 5–40)

## 2016-06-14 LAB — AMBIG ABBREV CMP14 DEFAULT

## 2016-06-14 LAB — COMPREHENSIVE METABOLIC PANEL
A/G RATIO: 1.9 (ref 1.2–2.2)
ALK PHOS: 65 IU/L (ref 39–117)
ALT: 51 IU/L — ABNORMAL HIGH (ref 0–32)
AST: 29 IU/L (ref 0–40)
Albumin: 4.5 g/dL (ref 3.5–5.5)
BUN/Creatinine Ratio: 20 (ref 9–23)
BUN: 16 mg/dL (ref 6–24)
Bilirubin Total: 0.4 mg/dL (ref 0.0–1.2)
CALCIUM: 9.5 mg/dL (ref 8.7–10.2)
CHLORIDE: 98 mmol/L (ref 96–106)
CO2: 25 mmol/L (ref 18–29)
Creatinine, Ser: 0.8 mg/dL (ref 0.57–1.00)
GFR calc non Af Amer: 87 mL/min/{1.73_m2} (ref >59)
GFR, EST AFRICAN AMERICAN: 100 mL/min/{1.73_m2} (ref >59)
GLOBULIN, TOTAL: 2.4 g/dL (ref 1.5–4.5)
Glucose: 133 mg/dL — ABNORMAL HIGH (ref 65–99)
POTASSIUM: 3.6 mmol/L (ref 3.5–5.2)
SODIUM: 140 mmol/L (ref 134–144)
Total Protein: 6.9 g/dL (ref 6.0–8.5)

## 2016-06-14 LAB — AMBIG ABBREV LP DEFAULT

## 2016-06-14 LAB — HGB A1C W/O EAG: Hgb A1c MFr Bld: 6.3 % — ABNORMAL HIGH (ref 4.8–5.6)

## 2016-07-02 DIAGNOSIS — D225 Melanocytic nevi of trunk: Secondary | ICD-10-CM | POA: Diagnosis not present

## 2016-07-02 DIAGNOSIS — D485 Neoplasm of uncertain behavior of skin: Secondary | ICD-10-CM | POA: Diagnosis not present

## 2016-07-02 DIAGNOSIS — Z1283 Encounter for screening for malignant neoplasm of skin: Secondary | ICD-10-CM | POA: Diagnosis not present

## 2016-07-02 DIAGNOSIS — L812 Freckles: Secondary | ICD-10-CM | POA: Diagnosis not present

## 2016-07-02 DIAGNOSIS — D229 Melanocytic nevi, unspecified: Secondary | ICD-10-CM | POA: Diagnosis not present

## 2016-07-02 DIAGNOSIS — L821 Other seborrheic keratosis: Secondary | ICD-10-CM | POA: Diagnosis not present

## 2016-07-02 DIAGNOSIS — L820 Inflamed seborrheic keratosis: Secondary | ICD-10-CM | POA: Diagnosis not present

## 2016-07-07 ENCOUNTER — Other Ambulatory Visit: Payer: Self-pay | Admitting: *Deleted

## 2016-07-07 MED ORDER — GLUCOSE BLOOD VI STRP: ORAL_STRIP | 12 refills | Status: DC

## 2016-07-07 MED ORDER — BAYER MICROLET LANCETS MISC: 12 refills | Status: DC

## 2016-07-07 NOTE — Telephone Encounter (Signed)
Pt has spoken to [employer] and confirmed coverage for glucose meter. Rx sent to pharmacy

## 2016-07-16 ENCOUNTER — Encounter: Payer: Self-pay | Admitting: Family Medicine

## 2016-07-16 ENCOUNTER — Other Ambulatory Visit: Payer: Self-pay | Admitting: Family Medicine

## 2016-08-28 ENCOUNTER — Encounter: Payer: Self-pay | Admitting: Medical

## 2016-08-28 ENCOUNTER — Ambulatory Visit: Payer: Self-pay | Admitting: Medical

## 2016-08-28 VITALS — BP 140/90 | HR 83 | Temp 98.8°F | Resp 18 | Ht 62.0 in | Wt 207.8 lb

## 2016-08-28 DIAGNOSIS — H6690 Otitis media, unspecified, unspecified ear: Secondary | ICD-10-CM

## 2016-08-28 DIAGNOSIS — J0100 Acute maxillary sinusitis, unspecified: Secondary | ICD-10-CM

## 2016-08-28 MED ORDER — AMOXICILLIN-POT CLAVULANATE 875-125 MG PO TABS: 1.0000 | ORAL_TABLET | Freq: Two times a day (BID) | ORAL | 0 refills | Status: DC

## 2016-08-28 NOTE — Progress Notes (Signed)
   Subjective:    Patient ID: Sheri Hood, female    DOB: February 08, 1966, 51 y.o.   MRN: [id number]  HPI Started yesterday with  Nasal congestion, ear pain L>R, cough productive yellow with a tinge of pink.  Headache  forehead. Ibuprofen 400mg  at  5am.   Review of Systems  Constitutional: Positive for chills. Negative for fever.  HENT: Positive for congestion, ear pain, sinus pain, sinus pressure, sore throat, trouble swallowing and voice change. Negative for tinnitus.   Eyes: Negative for discharge and itching.  Respiratory: Positive for cough. Negative for shortness of breath.   Cardiovascular: Negative for chest pain.  Gastrointestinal: Negative for abdominal pain.  Genitourinary: Positive for hematuria.  Musculoskeletal: Negative for myalgias.  Skin: Negative for rash.  Allergic/Immunologic: Positive for environmental allergies. Negative for food allergies.  Neurological: Negative for dizziness and syncope.  Hematological: Positive for adenopathy.  Psychiatric/Behavioral: Negative for confusion and hallucinations.   Left eye tearing, upeer teeth painful bilateral.    Objective:   Physical Exam  Constitutional: She is oriented to person, place, and time. She appears well-developed and well-nourished.  HENT:  Head: Normocephalic and atraumatic.  Right Ear: Hearing, external ear and ear canal normal. Tympanic membrane is not erythematous. A middle ear effusion is present.  Left Ear: External ear and ear canal normal. Tympanic membrane is erythematous.  Nose: Mucosal edema present. Right sinus exhibits maxillary sinus tenderness. Right sinus exhibits no frontal sinus tenderness. Left sinus exhibits maxillary sinus tenderness. Left sinus exhibits no frontal sinus tenderness.  Mouth/Throat: Uvula is midline, oropharynx is clear and moist and mucous membranes are normal.  Eyes: Conjunctivae and EOM are normal. Pupils are equal, round, and reactive to light.  Neck: Normal range of  motion. Neck supple.  Cardiovascular: Normal rate, regular rhythm and normal heart sounds.   Pulmonary/Chest: Effort normal and breath sounds normal.  Musculoskeletal: Normal range of motion.  Lymphadenopathy:    She has no cervical adenopathy.  Neurological: She is alert and oriented to person, place, and time.  Skin: Skin is warm and dry.  Psychiatric: She has a normal mood and affect. Her behavior is normal. Judgment and thought content normal.  Nursing note and vitals reviewed.         Assessment & Plan:  Sinusitis Left otitis media E-prescribed Augmentin 875-125mg  one by mouth twice daily for  Ten days #20 no refill. Rest , increase fluids, OTC ibuprofen 600mg  every 6 hours as needed for pain. Return to the clinic in 3-5 days if not improving.

## 2016-09-01 ENCOUNTER — Encounter: Payer: Self-pay | Admitting: Medical

## 2016-09-01 ENCOUNTER — Ambulatory Visit: Payer: Self-pay | Admitting: Medical

## 2016-09-01 VITALS — BP 145/90 | HR 88 | Temp 98.9°F | Resp 16 | Ht 62.0 in | Wt 209.0 lb

## 2016-09-01 DIAGNOSIS — J069 Acute upper respiratory infection, unspecified: Secondary | ICD-10-CM

## 2016-09-01 DIAGNOSIS — R059 Cough, unspecified: Secondary | ICD-10-CM

## 2016-09-01 DIAGNOSIS — R05 Cough: Secondary | ICD-10-CM

## 2016-09-01 DIAGNOSIS — H6983 Other specified disorders of Eustachian tube, bilateral: Secondary | ICD-10-CM

## 2016-09-01 DIAGNOSIS — R062 Wheezing: Secondary | ICD-10-CM

## 2016-09-01 MED ORDER — PREDNISONE 10 MG (21) PO TBPK: ORAL_TABLET | ORAL | 0 refills | Status: DC

## 2016-09-01 MED ORDER — ALBUTEROL SULFATE HFA 108 (90 BASE) MCG/ACT IN AERS: 2.0000 | INHALATION_SPRAY | Freq: Four times a day (QID) | RESPIRATORY_TRACT | 0 refills | Status: DC | PRN

## 2016-09-01 NOTE — Progress Notes (Signed)
   Subjective:    Patient ID: Sheri Hood, female    DOB: [birth year], 51 y.o.   MRN: [id number]  HPI  51 yo female returns to clinic , feels a little better the pain in face is much bette.  Left ear feels better but still  clogged feelings, feels heartbeat in left ear. Still coughing a light yellow.    Review of Systems  Constitutional: Positive for fatigue. Negative for chills and fever.  HENT: Positive for congestion, ear pain and sore throat. Negative for sinus pain and sinus pressure.   Eyes: Negative for discharge and itching.  Respiratory: Positive for cough, chest tightness and shortness of breath. Negative for wheezing.   Cardiovascular: Negative for chest pain.  Gastrointestinal: Negative for abdominal pain.  Genitourinary: Negative for hematuria.  Musculoskeletal: Negative for myalgias.  Skin: Negative for rash.  Neurological: Negative for syncope.  Hematological: Positive for adenopathy.  Psychiatric/Behavioral: Negative for agitation, behavioral problems and hallucinations.   Short of breath pushing herself tying to get dressed.   Decreased energy Objective:   Physical Exam  Constitutional: She is oriented to person, place, and time. She appears well-developed and well-nourished.  HENT:  Head: Normocephalic and atraumatic.  Right Ear: A middle ear effusion is present.  Left Ear: A middle ear effusion is present.  Nose: Mucosal edema and rhinorrhea present.  Mouth/Throat: Uvula is midline, oropharynx is clear and moist and mucous membranes are normal.  Eyes: Conjunctivae and EOM are normal. Pupils are equal, round, and reactive to light.  Neck: Normal range of motion. Neck supple.  Cardiovascular: Normal rate and regular rhythm.  Exam reveals no gallop and no friction rub.   No murmur heard. Pulmonary/Chest: Effort normal and breath sounds normal. She has no wheezes.  Musculoskeletal: Normal range of motion.  Lymphadenopathy:    She has no cervical adenopathy.   Neurological: She is alert and oriented to person, place, and time.  Skin: Skin is warm and dry. No erythema.  Psychiatric: She has a normal mood and affect. Her behavior is normal. Judgment and thought content normal.  Nursing note and vitals reviewed.  End wheeze with coughing       Assessment & Plan:   Continue antibiotics , Prednisone taper pak 10mg  take 6 tablets by mouth today then 5 tablets tomorrow then one tablet less each day thereafter #21 no refills. Albuterol MDI 2 puff every 6 hours as needed for cough , shortness of breath or wheezing or cough #1 no refills. OTC Zyrtec or Claritin and Flonase to help dry up post nasal drip, take as directed. Return Thurday If not improving. If worsening and feels short of breath, and feels she needs to be seen , reviewed with patient to seek out medical care at an urgent care on Wednesday due to the [facility] being closed. She verbalizes understanding and has no questions at discharge.

## 2016-09-06 ENCOUNTER — Ambulatory Visit: Payer: BLUE CROSS/BLUE SHIELD | Admitting: Family Medicine

## 2016-09-15 ENCOUNTER — Other Ambulatory Visit: Payer: Self-pay | Admitting: Family Medicine

## 2016-09-15 DIAGNOSIS — Z1231 Encounter for screening mammogram for malignant neoplasm of breast: Secondary | ICD-10-CM

## 2016-09-18 ENCOUNTER — Ambulatory Visit: Payer: Self-pay | Admitting: Adult Health

## 2016-09-18 VITALS — BP 132/88 | HR 80 | Temp 98.1°F

## 2016-09-18 DIAGNOSIS — J0191 Acute recurrent sinusitis, unspecified: Secondary | ICD-10-CM

## 2016-09-18 MED ORDER — CLINDAMYCIN HCL 150 MG PO CAPS: 150.0000 mg | ORAL_CAPSULE | Freq: Three times a day (TID) | ORAL | 0 refills | Status: DC

## 2016-09-18 NOTE — Progress Notes (Addendum)
Subjective:     Patient ID: Sheri Hood, female   DOB: [birth year], 51 y.o.   MRN: [id number]  HPI Patient is a 51 year old female in no acute distress, left sided facial pain started 7/18. C;lear nasal drainage and facial fullness continues for past three weeks per her report. Continues to have mild ear pain. Cough with chest tightness. She denies nay headache. She does report that  She has  " sinus pressure on left side of face"  Her sorethroat improved on Augmentin per her report.   She is taking [employer] brand zyrtec and will continue.   Allergies  Allergen Reactions  . Benadryl [Diphenhydramine Hcl] Other (See Comments)    Keeps awake  . Codeine Other (See Comments)    Keeps awake  . Dilaudid [Hydromorphone Hcl] Itching and Nausea Only  . Morphine And Related Itching and Nausea Only   Active Ambulatory Problems    Diagnosis Date Noted  . Recurrent nephrolithiasis 07/05/2012  . HTN (hypertension) 09/09/2013  . Depression 01/09/2014  . Fibroid uterus 01/09/2014  . Vitamin D deficiency 01/09/2014  . Dysuria 11/28/2015  . New onset type 2 diabetes mellitus ([place]) 12/04/2015   Resolved Ambulatory Problems    Diagnosis Date Noted  . Routine general medical examination at a health care facility 12/31/2011  . Routine gynecological examination 12/31/2011  . URI (upper respiratory infection) 12/31/2011  . Suprapubic pressure 07/05/2012  . Flu-like symptoms 06/02/2013  . Well woman exam with routine gynecological exam 01/09/2014  . Skin tag 01/09/2014  . Rash and nonspecific skin eruption 12/04/2014  . Dysuria 12/04/2014  . Flank pain 01/01/2015  . Low back pain 03/12/2015  . Left foot pain 08/08/2015   Past Medical History:  Diagnosis Date  . Anemia   . Arthritis   . Asthma   . Headache(784.0)   . Heart murmur   . Hiatal hernia   . Hypertension   . Recurrent upper respiratory infection (URI)   . Sleep apnea   . Sleep apnea       Review of Systems   Constitutional: Negative for activity change, appetite change, chills, diaphoresis, fatigue and fever.  HENT: Positive for congestion, ear pain, postnasal drip, rhinorrhea, sinus pain and sore throat (mild ). Negative for dental problem, drooling, ear discharge, facial swelling, hearing loss, mouth sores, nosebleeds, sinus pressure, sneezing and trouble swallowing.   Eyes: Negative.   Respiratory: Positive for cough and wheezing (mild she is using inhaler PRN with relief/ she reports she does not have refill). Negative for apnea, choking, chest tightness (" congested " points to bronchial ), shortness of breath and stridor.   Cardiovascular: Negative for chest pain, palpitations and leg swelling.  Gastrointestinal: Negative.   Endocrine: Negative.   Genitourinary: Negative.   Musculoskeletal: Negative.   Skin: Negative.   Allergic/Immunologic: Negative for environmental allergies, food allergies and immunocompromised state.  Neurological: Negative.   Hematological: Negative.   Psychiatric/Behavioral: Negative.        Objective:   Physical Exam  Constitutional: She is oriented to person, place, and time. Vital signs are normal. She appears well-developed and well-nourished. She is active. No distress.  HENT:  Head: Normocephalic and atraumatic.  Right Ear: Hearing, tympanic membrane, external ear and ear canal normal.  Left Ear: Hearing, tympanic membrane, external ear and ear canal normal.  Nose: Right sinus exhibits no maxillary sinus tenderness and no frontal sinus tenderness. Left sinus exhibits maxillary sinus tenderness and frontal sinus tenderness.  Mouth/Throat: Uvula is  midline and mucous membranes are normal. Posterior oropharyngeal erythema present. No oropharyngeal exudate, posterior oropharyngeal edema or tonsillar abscesses.  Eyes: Pupils are equal, round, and reactive to light. EOM and lids are normal. Right eye exhibits no discharge. Left eye exhibits no discharge.  Neck:  Normal range of motion. Neck supple.  Cardiovascular: Normal rate, regular rhythm and normal heart sounds.   Pulmonary/Chest: Effort normal and breath sounds normal. No respiratory distress. She has no wheezes. She has no rales. She exhibits no tenderness.  Abdominal: Soft.  Musculoskeletal: Normal range of motion.  Lymphadenopathy:       Head (right side): No submental, no submandibular, no tonsillar, no preauricular, no posterior auricular and no occipital adenopathy present.       Head (left side): Submental adenopathy present. No submandibular, no tonsillar, no preauricular, no posterior auricular and no occipital adenopathy present.    She has no cervical adenopathy.    She has no axillary adenopathy.  Neurological: She is alert and oriented to person, place, and time. She has normal strength and normal reflexes.  Skin: Skin is warm and dry. She is not diaphoretic. No pallor.  Psychiatric: She has a normal mood and affect. Her speech is normal and behavior is normal. Judgment and thought content normal. Cognition and memory are normal.       Assessment:    1. Sinusitis     Plan:      1. Will treat Sinusitis. E- Prescribed  Meds ordered this encounter  Medications  . clindamycin (CLEOCIN) 150 MG capsule    Sig: Take 1 capsule (150 mg total) by mouth 3 (three) times daily.    Dispense:  30 capsule    Refill:  0   Risk versus benefits of antibiotic therapy discussed and information on C-difficile added to my- chart as well as sinusitis handout.  2. Patient advised to return to clinic if no improvement in symptoms within 72 hours or if symptoms change or worsen at anytime. Advised urgent care of Emergency room if clinic is closed. Patient verbalized understanding of above instructions.

## 2016-09-18 NOTE — Patient Instructions (Signed)
At risk with antibiotic use just for your information Clostridium Difficile Infection Clostridium difficile (C. difficile or C. diff) infection causes inflammation of the large intestine (colon). This condition can result in damage to the lining of your colon and may lead to another condition called colitis. This infection can be passed from person to person (is contagious). Follow these instructions at home: Eating and drinking  Drink enough fluid to keep your pee (urine) clear or pale yellow.  Avoid drinking: ? Milk. ? Caffeine. ? Alcohol.  Follow exact instructions from your doctor about how to get enough fluid in your body (rehydrate).  Eat small meals often instead of large meals. Medicines  Take your antibiotic medicine as told by your doctor. Do not stop taking the antibiotic even if you start to feel better unless your doctor told you to do that.  Take over-the-counter and prescription medicines only as told by your doctor.  Do not use medicines to help with watery poop (diarrhea). General instructions  Wash your hands fully before you prepare food and after you use the bathroom. Make sure people who live with you also wash their  hands often.  Clean the surfaces that you touch. Use a product that contains chlorine bleach.  Keep all follow-up visits as told by your doctor. This is important. Contact a doctor if:  Your symptoms do not get better with treatment.  Your symptoms get worse with treatment.  Your symptoms go away and then come back.  You have a fever.  You have new symptoms. Get help right away if:  You have more pain or tenderness in your belly (abdomen).  Your poop (stool) is mostly bloody.  Your poop looks dark black and tarry.  You cannot eat or drink without throwing up (vomiting).  You have signs of dehydration, such as: ? Dark pee, very little pee, or no pee. ? Cracked lips. ? Not making tears when you cry. ? Dry mouth. ? Sunken  eyes. ? Feeling sleepy. ? Feeling weak. ? Feeling dizzy. This information is not intended to replace advice given to you by your health care provider. Make sure you discuss any questions you have with your health care provider. Document Released: 12/15/2008 Document Revised: 07/26/2015 Document Reviewed: 08/21/2014 Elsevier Interactive Patient Education  2017 Else[employer]. Sinusitis, Adult Sinusitis is soreness and inflammation of your sinuses. Sinuses are hollow spaces in the bones around your face. They are located:  Around your eyes.  In the middle of your forehead.  Behind your nose.  In your cheekbones.  Your sinuses and nasal passages are lined with a stringy fluid (mucus). Mucus normally drains out of your sinuses. When your nasal tissues get inflamed or swollen, the mucus can get trapped or blocked so air cannot flow through your sinuses. This lets bacteria, viruses, and funguses grow, and that leads to infection. Follow these instructions at home: Medicines  Take, use, or apply over-the-counter and prescription medicines only as told by your doctor. These may include nasal sprays.  If you were prescribed an antibiotic medicine, take it as told by your doctor. Do not stop taking the antibiotic even if you start to feel better. Hydrate and Humidify  Drink enough water to keep your pee (urine) clear or pale yellow.  Use a cool mist humidifier to keep the humidity level in your home above 50%.  Breathe in steam for 10-15 minutes, 3-4 times a day or as told by your doctor. You can do this in the  bathroom while a hot shower is running.  Try not to spend time in cool or dry air. Rest  Rest as much as possible.  Sleep with your head raised (elevated).  Make sure to get enough sleep each night. General instructions  Put a warm, moist washcloth on your face 3-4 times a day or as told by your doctor. This will help with discomfort.  Wash your hands often with soap and  water. If there is no soap and water, use hand sanitizer.  Do not smoke. Avoid being around people who are smoking (secondhand smoke).  Keep all follow-up visits as told by your doctor. This is important. Contact a doctor if:  You have a fever.  Your symptoms get worse.  Your symptoms do not get better within 10 days. Get help right away if:  You have a very bad headache.  You cannot stop throwing up (vomiting).  You have pain or swelling around your face or eyes.  You have trouble seeing.  You feel confused.  Your neck is stiff.  You have trouble breathing. This information is not intended to replace advice given to you by your health care provider. Make sure you discuss any questions you have with your health care provider. Document Released: 08/06/2007 Document Revised: 10/14/2015 Document Reviewed: 12/13/2014 Elsevier Interactive Patient Education  [employer].

## 2016-09-22 ENCOUNTER — Ambulatory Visit: Payer: Self-pay | Admitting: Medical

## 2016-09-22 VITALS — BP 130/88 | HR 78 | Temp 98.3°F | Resp 16

## 2016-09-22 DIAGNOSIS — R531 Weakness: Secondary | ICD-10-CM

## 2016-09-22 DIAGNOSIS — I10 Essential (primary) hypertension: Secondary | ICD-10-CM

## 2016-09-22 NOTE — Patient Instructions (Addendum)

## 2016-09-22 NOTE — Progress Notes (Signed)
Subjective:    Patient ID: Sheri Hood, female    DOB: June 03, 1965, 51 y.o.   MRN: [id number]  HPI  51 yo female works at [employer] , had a nurse take her blood pressure  122/106 , didn't feel right , fingers felt numb felt like she cannot move and energy felt like it had been zapped. Feels funny fells like she has been drugged. in the head, no headache. Recheck one hour later  142/110. Told by the nurse to go and see someone medically.  Feels sluggish , no tried.  History of higher blood pressure readings prior to blood pressure medication with out these types of feelings. Tingling in fingers in all 10 fingers . Denies weakness.  All of a sudden she was not feeling good. Sitting at desk working  When symptoms came on, , Right now feels a little better than she did.  On Clindamycin 4 days  for sinusitis. Took blood pressure medication HCTZ 46m today.  Has been under a lot of stress with [employer] Corporationbeing down.  Review of Systems  Constitutional: Positive for fatigue. Negative for chills, diaphoresis and fever.  HENT: Positive for congestion, rhinorrhea and sneezing. Negative for drooling, ear discharge, ear pain, facial swelling, sinus pain, sore throat, tinnitus, trouble swallowing and voice change.   Eyes: Negative for photophobia and visual disturbance.  Respiratory: Positive for cough. Negative for shortness of breath.   Cardiovascular: Negative for chest pain.  Gastrointestinal: Negative for abdominal pain.  Endocrine: Negative for polydipsia, polyphagia and polyuria.  Genitourinary: Negative for dysuria and hematuria.  Musculoskeletal: Negative for myalgias.  Skin: Negative for rash and wound.  Allergic/Immunologic: Negative for environmental allergies and food allergies.  Neurological: Positive for numbness. Negative for syncope and light-headedness.  Hematological: Negative for adenopathy.  Psychiatric/Behavioral: Negative for behavioral problems, confusion and  hallucinations.   Numbness in fingers improved. No presyncope symptoms.   clear runny nose sinus pressure on the left maxillary side..Marland KitchenAntibiotic burns and feels like it gets stuckin her throat. Mild cough from some throat congestion clear.   Objective:   Physical Exam  Constitutional: She appears well-developed and well-nourished.  HENT:  Head: Normocephalic and atraumatic.  Right Ear: External ear normal.  Left Ear: External ear normal.  Nose: Nose normal.  Mouth/Throat: Oropharynx is clear and moist.  Eyes: Pupils are equal, round, and reactive to light. Conjunctivae and EOM are normal.  Neck: Normal range of motion. Neck supple.  Cardiovascular: Normal rate, regular rhythm and normal heart sounds.  Exam reveals no gallop and no friction rub.   No murmur heard. Pulmonary/Chest: Effort normal and breath sounds normal.  Lymphadenopathy:    She has no cervical adenopathy.  Nursing note and vitals reviewed.    Finger test , patient knew. each finger I tested with her eyes closed both left and right hands.     glucose  109  Assessment & Plan:  Hypertension Will stop antibiotics for right now(Cleocin), it may be the cause of some of her symptoms.  It is catching in her  throat and causes burning sensation, drinking it lemonade crystal light. [place]. 3-4 times a day.  Return to the clinic in 24 hours. Time to seek out medical care like the Emergency Department, not acting like herself, severe headache. "Not feeling right" trouble moving an arm or a leg.  CBC w/diff and Met C  and TSH .stat  [phone]  No answer times 2 calls. Called about  1 hour  later.  9 pm Called patient with lab results elevated glucose 104  ( not fasting ) and  ALT (53) which she says has been elevated for a few years now due to fatty liver all other labs within normal limits. She currently is feeling fine now. She wonders if it is the stress at work ( computer problems through the [employer]) that was making her blood pressure go up. I Explained this is one possibility.  Reassured patient we would check it out tomorrow. She was thankful I called.

## 2016-09-23 ENCOUNTER — Ambulatory Visit: Payer: Self-pay | Admitting: Medical

## 2016-09-23 VITALS — BP 122/84 | HR 87 | Temp 97.0°F | Resp 16

## 2016-09-23 DIAGNOSIS — I10 Essential (primary) hypertension: Secondary | ICD-10-CM

## 2016-09-23 LAB — CBC WITH DIFFERENTIAL/PLATELET
Basophils Absolute: 0 10*3/uL (ref 0.0–0.2)
Basos: 0 %
EOS (ABSOLUTE): 0.2 10*3/uL (ref 0.0–0.4)
EOS: 4 %
HEMATOCRIT: 41.7 % (ref 34.0–46.6)
Hemoglobin: 14.6 g/dL (ref 11.1–15.9)
IMMATURE GRANULOCYTES: 0 %
Immature Grans (Abs): 0 10*3/uL (ref 0.0–0.1)
Lymphocytes Absolute: 1.7 10*3/uL (ref 0.7–3.1)
Lymphs: 33 %
MCH: 29.4 pg (ref 26.6–33.0)
MCHC: 35 g/dL (ref 31.5–35.7)
MCV: 84 fL (ref 79–97)
MONOS ABS: 0.3 10*3/uL (ref 0.1–0.9)
Monocytes: 7 %
NEUTROS PCT: 56 %
Neutrophils Absolute: 2.8 10*3/uL (ref 1.4–7.0)
PLATELETS: 150 10*3/uL (ref 150–379)
RBC: 4.96 x10E6/uL (ref 3.77–5.28)
RDW: 14.1 % (ref 12.3–15.4)
WBC: 5 10*3/uL (ref 3.4–10.8)

## 2016-09-23 LAB — TSH: TSH: 1.02 u[IU]/mL (ref 0.450–4.500)

## 2016-09-23 LAB — COMPREHENSIVE METABOLIC PANEL
ALT: 53 IU/L — ABNORMAL HIGH (ref 0–32)
AST: 33 IU/L (ref 0–40)
Albumin/Globulin Ratio: 2.2 (ref 1.2–2.2)
Albumin: 4.8 g/dL (ref 3.5–5.5)
Alkaline Phosphatase: 75 IU/L (ref 39–117)
BUN/Creatinine Ratio: 19 (ref 9–23)
BUN: 14 mg/dL (ref 6–24)
Bilirubin Total: 0.3 mg/dL (ref 0.0–1.2)
CO2: 22 mmol/L (ref 20–29)
Calcium: 9.8 mg/dL (ref 8.7–10.2)
Chloride: 103 mmol/L (ref 96–106)
Creatinine, Ser: 0.75 mg/dL (ref 0.57–1.00)
GFR calc Af Amer: 108 mL/min/{1.73_m2} (ref >59)
GFR calc non Af Amer: 94 mL/min/{1.73_m2} (ref >59)
Globulin, Total: 2.2 g/dL (ref 1.5–4.5)
Glucose: 104 mg/dL — ABNORMAL HIGH (ref 65–99)
Potassium: 3.7 mmol/L (ref 3.5–5.2)
Sodium: 140 mmol/L (ref 134–144)
Total Protein: 7 g/dL (ref 6.0–8.5)

## 2016-09-23 NOTE — Patient Instructions (Addendum)
To return to the clinic today if blood pressure goes up to 100 or more or symptomatic.   Hypertension Hypertension is another name for high blood pressure. High blood pressure forces your heart to work harder to pump blood. This can cause problems over time. There are two numbers in a blood pressure reading. There is a top number (systolic) over a bottom number (diastolic). It is best to have a blood pressure below 120/80. Healthy choices can help lower your blood pressure. You may need medicine to help lower your blood pressure if:  Your blood pressure cannot be lowered with healthy choices.  Your blood pressure is higher than 130/80.  Follow these instructions at home: Eating and drinking  If directed, follow the DASH eating plan. This diet includes: ? Filling half of your plate at each meal with fruits and vegetables. ? Filling one quarter of your plate at each meal with whole grains. Whole grains include whole wheat pasta, brown rice, and whole grain bread. ? Eating or drinking low-fat dairy products, such as skim milk or low-fat yogurt. ? Filling one quarter of your plate at each meal with low-fat (lean) proteins. Low-fat proteins include fish, skinless chicken, eggs, beans, and tofu. ? Avoiding fatty meat, cured and processed meat, or chicken with skin. ? Avoiding premade or processed food.  Eat less than 1,500 mg of salt (sodium) a day.  Limit alcohol use to no more than 1 drink a day for nonpregnant women and 2 drinks a day for men. One drink equals 12 oz of beer, 5 oz of wine, or 1 oz of hard liquor. Lifestyle  Work with your doctor to stay at a healthy weight or to lose weight. Ask your doctor what the best weight is for you.  Get at least 30 minutes of exercise that causes your heart to beat faster (aerobic exercise) most days of the week. This may include walking, swimming, or biking.  Get at least 30 minutes of exercise that strengthens your muscles (resistance exercise)  at least 3 days a week. This may include lifting weights or pilates.  Do not use any products that contain nicotine or tobacco. This includes cigarettes and e-cigarettes. If you need help quitting, ask your doctor.  Check your blood pressure at home as told by your doctor.  Keep all follow-up visits as told by your doctor. This is important. Medicines  Take over-the-counter and prescription medicines only as told by your doctor. Follow directions carefully.  Do not skip doses of blood pressure medicine. The medicine does not work as well if you skip doses. Skipping doses also puts you at risk for problems.  Ask your doctor about side effects or reactions to medicines that you should watch for. Contact a doctor if:  You think you are having a reaction to the medicine you are taking.  You have headaches that keep coming back (recurring).  You feel dizzy.  You have swelling in your ankles.  You have trouble with your vision. Get help right away if:  You get a very bad headache.  You start to feel confused.  You feel weak or numb.  You feel faint.  You get very bad pain in your: ? Chest. ? Belly (abdomen).  You throw up (vomit) more than once.  You have trouble breathing. Summary  Hypertension is another name for high blood pressure.  Making healthy choices can help lower blood pressure. If your blood pressure cannot be controlled with healthy choices, you may  need to take medicine. This information is not intended to replace advice given to you by your health care provider. Make sure you discuss any questions you have with your health care provider. Document Released: 08/06/2007 Document Revised: 01/16/2016 Document Reviewed: 01/16/2016 Elsevier Interactive Patient Education  2018 [employer]. Sinusitis, Adult Sinusitis is soreness and inflammation of your sinuses. Sinuses are hollow spaces in the bones around your face. They are located:  Around your eyes.  In  the middle of your forehead.  Behind your nose.  In your cheekbones.  Your sinuses and nasal passages are lined with a stringy fluid (mucus). Mucus normally drains out of your sinuses. When your nasal tissues get inflamed or swollen, the mucus can get trapped or blocked so air cannot flow through your sinuses. This lets bacteria, viruses, and funguses grow, and that leads to infection. Follow these instructions at home: Medicines  Take, use, or apply over-the-counter and prescription medicines only as told by your doctor. These may include nasal sprays.  If you were prescribed an antibiotic medicine, take it as told by your doctor. Do not stop taking the antibiotic even if you start to feel better. Hydrate and Humidify  Drink enough water to keep your pee (urine) clear or pale yellow.  Use a cool mist humidifier to keep the humidity level in your home above 50%.  Breathe in steam for 10-15 minutes, 3-4 times a day or as told by your doctor. You can do this in the bathroom while a hot shower is running.  Try not to spend time in cool or dry air. Rest  Rest as much as possible.  Sleep with your head raised (elevated).  Make sure to get enough sleep each night. General instructions  Put a warm, moist washcloth on your face 3-4 times a day or as told by your doctor. This will help with discomfort.  Wash your hands often with soap and water. If there is no soap and water, use hand sanitizer.  Do not smoke. Avoid being around people who are smoking (secondhand smoke).  Keep all follow-up visits as told by your doctor. This is important. Contact a doctor if:  You have a fever.  Your symptoms get worse.  Your symptoms do not get better within 10 days. Get help right away if:  You have a very bad headache.  You cannot stop throwing up (vomiting).  You have pain or swelling around your face or eyes.  You have trouble seeing.  You feel confused.  Your neck is  stiff.  You have trouble breathing. This information is not intended to replace advice given to you by your health care provider. Make sure you discuss any questions you have with your health care provider. Document Released: 08/06/2007 Document Revised: 10/14/2015 Document Reviewed: 12/13/2014 Elsevier Interactive Patient Education  [employer].

## 2016-09-23 NOTE — Progress Notes (Signed)
   Subjective:    Patient ID: Sheri Hood, female    DOB: 11/20/1965, 51 y.o.   MRN: [id number]  HPI  51 yo female returns for recheck of blood pressure , today left arm  122/84. Patient states this is around her usual numbers. Seemed yesterday at work especially stressful works for [employer] better today. Feels she can go back to work. Every now and then has pain has pain under left eye area , lasts  5 min or so then goes away. End of the day has nasal congestion and has trouble breathing out of the nose.    Review of Systems  Constitutional: Negative for chills, fatigue and fever.  HENT: Positive for congestion, postnasal drip, rhinorrhea and sinus pain. Negative for ear pain, sneezing and sore throat.   Eyes: Negative for discharge, itching and visual disturbance.  Respiratory: Positive for cough. Negative for shortness of breath.   Cardiovascular: Negative for chest pain, palpitations and leg swelling.  Gastrointestinal: Negative for abdominal pain.  Endocrine: Negative for polydipsia, polyphagia and polyuria.  Genitourinary: Negative for dysuria and hematuria.  Musculoskeletal: Negative for back pain and myalgias.  Skin: Negative for rash.  Neurological: Negative for dizziness, syncope, facial asymmetry, light-headedness and headaches.  Hematological: Negative for adenopathy.  Psychiatric/Behavioral: Negative for agitation, behavioral problems, confusion and hallucinations. The patient is not nervous/anxious.        Objective:   Physical Exam  Constitutional: She appears well-developed and well-nourished.  HENT:  Head: Normocephalic and atraumatic.  Right Ear: External ear normal. A middle ear effusion is present.  Left Ear: External ear normal. A middle ear effusion is present.  Nose: Mucosal edema present. Left sinus exhibits maxillary sinus tenderness.  Mouth/Throat: Oropharynx is clear and moist.  Eyes: Pupils are equal, round, and reactive to light. Conjunctivae  and EOM are normal.  Neck: Normal range of motion. Neck supple.  Cardiovascular: Normal rate, regular rhythm and normal heart sounds.  Exam reveals no gallop and no friction rub.   No murmur heard. Pulmonary/Chest: Effort normal and breath sounds normal.  Nursing note and vitals reviewed.  Mild left maxillary discomfort per patient. turbinate swelling on the right side, redness on the left side.      Assessment & Plan:  Hypertension Sinusitis Has appointment with Dr. Pryor Ochoa on August 1st 2018. Severe pain or other concerns about your sinuses to return to the clinic. To return to the clinic today if blood pressure goes up to 092 diastolic or more, or if symptomatic.

## 2016-09-30 ENCOUNTER — Ambulatory Visit
Admission: RE | Admit: 2016-09-30 | Discharge: 2016-09-30 | Disposition: A | Discharge status: Home / Self Care | Payer: BLUE CROSS/BLUE SHIELD | Source: Ambulatory Visit | Attending: Family Medicine | Admitting: Family Medicine

## 2016-09-30 DIAGNOSIS — Z1231 Encounter for screening mammogram for malignant neoplasm of breast: Secondary | ICD-10-CM | POA: Insufficient documentation

## 2016-10-01 DIAGNOSIS — J0100 Acute maxillary sinusitis, unspecified: Secondary | ICD-10-CM | POA: Diagnosis not present

## 2016-11-14 ENCOUNTER — Other Ambulatory Visit: Payer: Self-pay | Admitting: Family Medicine

## 2016-12-22 ENCOUNTER — Other Ambulatory Visit: Payer: Self-pay | Admitting: Family Medicine

## 2016-12-23 NOTE — Telephone Encounter (Signed)
Copied from [place] #[id number]. Topic: Quick Communication - See Telephone Encounter >> Dec 23, 2016  8:54 AM Ahmed Prima L wrote: CRM for notification. See Telephone encounter for:  12/23/16. Needs refill on celexa. Says that she has already contacted the pharmacy & they are stating they have not received anything as of 6:30pm yesterday. She is completely out of the script as of last night. Can call her back at her desk @ [phone].

## 2017-01-01 ENCOUNTER — Ambulatory Visit: Payer: Self-pay | Admitting: Adult Health

## 2017-01-01 ENCOUNTER — Encounter: Payer: Self-pay | Admitting: Adult Health

## 2017-01-01 VITALS — BP 126/78 | HR 78 | Temp 98.7°F | Wt 209.6 lb

## 2017-01-01 DIAGNOSIS — J328 Other chronic sinusitis: Secondary | ICD-10-CM

## 2017-01-01 MED ORDER — AMOXICILLIN-POT CLAVULANATE 875-125 MG PO TABS: 1.0000 | ORAL_TABLET | Freq: Two times a day (BID) | ORAL | 0 refills | Status: DC

## 2017-01-01 NOTE — Patient Instructions (Signed)
Amoxicillin; Clavulanic Acid tablets What is this medicine? AMOXICILLIN; CLAVULANIC ACID (a mox i SIL in; KLAV yoo lan ic AS id) is a penicillin antibiotic. It is used to treat certain kinds of bacterial infections. It will not work for colds, flu, or other viral infections. This medicine may be used for other purposes; ask your health care provider or pharmacist if you have questions. COMMON BRAND NAME(S): Augmentin What should I tell my health care provider before I take this medicine? They need to know if you have any of these conditions: -bowel disease, like colitis -kidney disease -liver disease -mononucleosis -an unusual or allergic reaction to amoxicillin, penicillin, cephalosporin, other antibiotics, clavulanic acid, other medicines, foods, dyes, or preservatives -pregnant or trying to get pregnant -breast-feeding How should I use this medicine? Take this medicine by mouth with a full glass of water. Follow the directions on the prescription label. Take at the start of a meal. Do not crush or chew. If the tablet has a score line, you may cut it in half at the score line for easier swallowing. Take your medicine at regular intervals. Do not take your medicine more often than directed. Take all of your medicine as directed even if you think you are better. Do not skip doses or stop your medicine early. Talk to your pediatrician regarding the use of this medicine in children. Special care may be needed. Overdosage: If you think you have taken too much of this medicine contact a poison control center or emergency room at once. NOTE: This medicine is only for you. Do not share this medicine with others. What if I miss a dose? If you miss a dose, take it as soon as you can. If it is almost time for your next dose, take only that dose. Do not take double or extra doses. What may interact with this medicine? -allopurinol -anticoagulants -birth control pills -methotrexate -probenecid This  list may not describe all possible interactions. Give your health care provider a list of all the medicines, herbs, non-prescription drugs, or dietary supplements you use. Also tell them if you smoke, drink alcohol, or use illegal drugs. Some items may interact with your medicine. What should I watch for while using this medicine? Tell your doctor or health care professional if your symptoms do not improve. Do not treat diarrhea with over the counter products. Contact your doctor if you have diarrhea that lasts more than 2 days or if it is severe and watery. If you have diabetes, you may get a false-positive result for sugar in your urine. Check with your doctor or health care professional. Birth control pills may not work properly while you are taking this medicine. Talk to your doctor about using an extra method of birth control. What side effects may I notice from receiving this medicine? Side effects that you should report to your doctor or health care professional as soon as possible: -allergic reactions like skin rash, itching or hives, swelling of the face, lips, or tongue -breathing problems -dark urine -fever or chills, sore throat -redness, blistering, peeling or loosening of the skin, including inside the mouth -seizures -trouble passing urine or change in the amount of urine -unusual bleeding, bruising -unusually weak or tired -white patches or sores in the mouth or throat Side effects that usually do not require medical attention (report to your doctor or health care professional if they continue or are bothersome): -diarrhea -dizziness -headache -nausea, vomiting -stomach upset -vaginal or anal irritation This list may   not describe all possible side effects. Call your doctor for medical advice about side effects. You may report side effects to FDA at 1-800-FDA-1088. Where should I keep my medicine? Keep out of the reach of children. Store at room temperature below 25 degrees  C (77 degrees F). Keep container tightly closed. Throw away any unused medicine after the expiration date. NOTE: This sheet is a summary. It may not cover all possible information. If you have questions about this medicine, talk to your doctor, pharmacist, or health care provider.  2018 Elsevier/Gold Standard (2007-05-13 12:04:30)  Sinusitis, Adult Sinusitis is soreness and inflammation of your sinuses. Sinuses are hollow spaces in the bones around your face. They are located:  Around your eyes.  In the middle of your forehead.  Behind your nose.  In your cheekbones.  Your sinuses and nasal passages are lined with a stringy fluid (mucus). Mucus normally drains out of your sinuses. When your nasal tissues get inflamed or swollen, the mucus can get trapped or blocked so air cannot flow through your sinuses. This lets bacteria, viruses, and funguses grow, and that leads to infection. Follow these instructions at home: Medicines  Take, use, or apply over-the-counter and prescription medicines only as told by your doctor. These may include nasal sprays.  If you were prescribed an antibiotic medicine, take it as told by your doctor. Do not stop taking the antibiotic even if you start to feel better. Hydrate and Humidify  Drink enough water to keep your pee (urine) clear or pale yellow.  Use a cool mist humidifier to keep the humidity level in your home above 50%.  Breathe in steam for 10-15 minutes, 3-4 times a day or as told by your doctor. You can do this in the bathroom while a hot shower is running.  Try not to spend time in cool or dry air. Rest  Rest as much as possible.  Sleep with your head raised (elevated).  Make sure to get enough sleep each night. General instructions  Put a warm, moist washcloth on your face 3-4 times a day or as told by your doctor. This will help with discomfort.  Wash your hands often with soap and water. If there is no soap and water, use hand  sanitizer.  Do not smoke. Avoid being around people who are smoking (secondhand smoke).  Keep all follow-up visits as told by your doctor. This is important. Contact a doctor if:  You have a fever.  Your symptoms get worse.  Your symptoms do not get better within 10 days. Get help right away if:  You have a very bad headache.  You cannot stop throwing up (vomiting).  You have pain or swelling around your face or eyes.  You have trouble seeing.  You feel confused.  Your neck is stiff.  You have trouble breathing. This information is not intended to replace advice given to you by your health care provider. Make sure you discuss any questions you have with your health care provider. Document Released: 08/06/2007 Document Revised: 10/14/2015 Document Reviewed: 12/13/2014 Elsevier Interactive Patient Education  2018 Elsevier Inc.  

## 2017-01-01 NOTE — Progress Notes (Addendum)
Subjective:    Patient ID: Sheri Hood, female    DOB: 04/01/1965, 51 y.o.   MRN: [id number]  HPI Patient is a 51 year old female in no acute distress, she reports her symptoms started 12/27/16 with nasal congestion, sinus heache forehead and both cheeks.   She reports she feels like she has congestion in her chest since last night.  She has taken Sudafed and Mucinex.   She wears a CPAP machine, cleans as instructed.She replaces equipment as recommended.   She denies any fever,  rash, chest pain, shortness of breath, nausea, vomiting, or diarrhea  She has history of chronic sinusitis. She has been seen in this office on 09/23/16 for sinusitis/ 09/18/16 for sinusitis, 7/2- eustachian tub dysfunction and on 08/28/16 for sinusitis. She was seen and treated in this office and referred to Dr. Pryor Ochoa at [facility].  She has seen Dr. Pryor Ochoa ENT in the office this year once and was told she may need a CT of her sinuses and to be scoped for evaluation and she had a follow up appointment and missed this. She has not called to reschedule since missing her August appointment.   LMP- menapausal and endometrial ablation- no chance of pregnancy per patient   She reports she sees Lucille Passy, MD for annual physicals and sees regularly.   Blood pressure 126/78, pulse 78, temperature 98.7 F (37.1 C), temperature source Tympanic, weight 209 lb 9.6 oz (95.1 kg), SpO2 98 %.   Current Outpatient Prescriptions:  .  BAYER MICROLET LANCETS lancets, Use as instructed to test blood sugar once daily. E11.9, Disp: 100 each, Rfl: 12 .  glucose blood (BAYER CONTOUR NEXT TEST) test strip, Use as instructed to test blood sugar once daily. E11.9, Disp: 100 each, Rfl: 12 .  ibuprofen (ADVIL,MOTRIN) 200 MG tablet, Take 200 mg by mouth every 6 (six) hours as needed. For pain, Disp: , Rfl:  .  metFORMIN (GLUCOPHAGE) 500 MG tablet, TAKE 1 TABLET BY MOUTH TWICE DAILY WITH A MEAL, Disp: 180 tablet, Rfl: 0 .   omeprazole (PRILOSEC) 20 MG capsule, Take 20 mg by mouth daily., Disp: , Rfl:  .  vitamin E 400 UNIT capsule, Take 800 Units by mouth at bedtime. , Disp: , Rfl:  .  albuterol (PROVENTIL HFA;VENTOLIN HFA) 108 (90 Base) MCG/ACT inhaler, Inhale 2 puffs into the lungs every 6 (six) hours as needed for wheezing or shortness of breath. Or cough (Patient not taking: Reported on 01/01/2017), Disp: 1 Inhaler, Rfl: 0 .  citalopram (CELEXA) 40 MG tablet, Take 40 mg by mouth daily., Disp: , Rfl:  .  citalopram (CELEXA) 40 MG tablet, TAKE ONE TABLET BY MOUTH AT BEDTIME (Patient not taking: Reported on 01/01/2017), Disp: 30 tablet, Rfl: 0 .  hydrochlorothiazide (HYDRODIURIL) 25 MG tablet, Take 1 tablet (25 mg total) by mouth every morning. (Patient not taking: Reported on 01/01/2017), Disp: 90 tablet, Rfl: 3 Allergies  Allergen Reactions  . Benadryl [Diphenhydramine Hcl] Other (See Comments)    Keeps awake, jittery  . Codeine Other (See Comments)    Keeps awake  . Dilaudid [Hydromorphone Hcl] Itching and Nausea Only  . Morphine And Related Itching and Nausea Only    Patient Active Problem List   Diagnosis Date Noted  . New onset type 2 diabetes mellitus ([place]) 12/04/2015  . Dysuria 11/28/2015  . Sleep disturbance 02/08/2014  . Depression 01/09/2014  . Fibroid uterus 01/09/2014  . Vitamin D deficiency 01/09/2014  . HTN (hypertension) 09/09/2013  .  Recurrent nephrolithiasis 07/05/2012     Review of Systems  Constitutional: Negative for activity change, appetite change, chills, diaphoresis, fatigue, fever and unexpected weight change.  HENT: Positive for postnasal drip, rhinorrhea, sinus pain (occasionally in bilateral maxillary and all of upper teeth at times since Monday / none now), sinus pressure, sneezing and sore throat (mild ). Negative for congestion, dental problem, drooling, ear discharge, ear pain, facial swelling, hearing loss, mouth sores, nosebleeds, tinnitus, trouble swallowing and voice  change.   Respiratory: Positive for cough (post nasal and in chest ). Negative for apnea, choking, chest tightness, shortness of breath, wheezing and stridor.   Cardiovascular: Negative for chest pain, palpitations and leg swelling.  Gastrointestinal: Negative.   Genitourinary: Negative.   Musculoskeletal: Negative.   Skin: Negative.   Allergic/Immunologic: Negative.   Neurological: Positive for headaches (none now but has had intermittent since Sunday ).  Hematological: Negative.   Psychiatric/Behavioral: Negative.        Objective:   Physical Exam  Constitutional: She is oriented to person, place, and time. Vital signs are normal. She appears well-developed and well-nourished. She is active. No distress.  HENT:  Head: Normocephalic and atraumatic.  Right Ear: Hearing, tympanic membrane, external ear and ear canal normal. Tympanic membrane is not perforated and not erythematous. No middle ear effusion.  Left Ear: Hearing, tympanic membrane, external ear and ear canal normal. No swelling. Tympanic membrane is not perforated and not erythematous.  No middle ear effusion.  Nose: Mucosal edema and rhinorrhea present. No sinus tenderness or nasal deformity. Right sinus exhibits maxillary sinus tenderness. Right sinus exhibits no frontal sinus tenderness. Left sinus exhibits maxillary sinus tenderness. Left sinus exhibits no frontal sinus tenderness.  Mouth/Throat: Uvula is midline, oropharynx is clear and moist and mucous membranes are normal. Mucous membranes are not pale, not dry and not cyanotic. No uvula swelling.  Eyes: Pupils are equal, round, and reactive to light. Conjunctivae and EOM are normal. Right eye exhibits no discharge. Left eye exhibits no discharge. No scleral icterus.  Neck: Normal range of motion. Neck supple. No JVD present. No tracheal deviation present. No thyromegaly present.  Cardiovascular: Normal rate, regular rhythm, normal heart sounds and intact distal pulses.   Exam reveals no gallop and no friction rub.   No murmur heard. Pulmonary/Chest: Effort normal and breath sounds normal. No stridor. No respiratory distress. She has no wheezes. She has no rales. She exhibits no tenderness.  Abdominal: Soft. Bowel sounds are normal. She exhibits no distension. There is no tenderness.  Musculoskeletal: Normal range of motion. She exhibits no edema, tenderness or deformity.  Patient moves on and off of exam table and in room without difficulty. Gait is normal in hall and in room. Patient is oriented to person place time and situation. Patient answers questions appropriately and engages in conversation.   Lymphadenopathy:    She has no cervical adenopathy.  Neurological: She is alert and oriented to person, place, and time. She has normal strength and normal reflexes. No cranial nerve deficit. She displays a negative Romberg sign. Coordination and gait normal.  Skin: Skin is warm, dry and intact. No rash noted. She is not diaphoretic. No cyanosis. No pallor. Nails show no clubbing.  Psychiatric: She has a normal mood and affect. Her speech is normal and behavior is normal. Judgment and thought content normal. Cognition and memory are normal.  Nursing note and vitals reviewed.         Assessment & Plan:   Other chronic  sinusitis  SHE HAS ALREADY BEEN REFERRED TO ENT  She is a patient of Dr. Milas Hock and is advised she needs to follow up with his office today and schedule an appointment for follow up within the next week and follow his recommendation. Discussed importance of keeping appointments with ENT given history and risks of possible malignancy, polyps or other ear nose or throat deformities that should be ruled out by specialist like Dr. Pryor Ochoa.  She is advised to also see  Lucille Passy, MD and Dr. Pryor Ochoa for any additional sinusitis as this is chronic.  She is also advised to keep regularly scheduled appointments with her pulmonologist as needed and  scheduled for her sleep apnea and CPAP machine.  E- prescribed  Meds ordered this encounter  Medications  . amoxicillin-clavulanate (AUGMENTIN) 875-125 MG tablet    Sig: Take 1 tablet by mouth 2 (two) times daily.    Dispense:  20 tablet    Refill:  0   Return to  Lucille Passy, MD/ Dr Lula Olszewski ENT at any time call for appointment  if any new symptoms change, worsen or do not improve. Symptoms should improve  within 72 hours and if not improving you should call for an appointment at the clinic or be seen in urgent care/ED if clinic is closed. Your symptoms should not get worse from this point forward and if they do seek immediate medical attention. Patient verbalized understanding of instructions and denies any further questions at this time.

## 2017-01-08 DIAGNOSIS — J320 Chronic maxillary sinusitis: Secondary | ICD-10-CM | POA: Diagnosis not present

## 2017-01-09 ENCOUNTER — Telehealth: Payer: Self-pay

## 2017-01-09 NOTE — Telephone Encounter (Signed)
Copied from [place] [id number]. Topic: General - Other >> Jan 09, 2017 11:33 AM Cecelia Byars, NT wrote: Reason for CRM: [facility] lab work done instead of coming in, she has a physical on 11/13    please fax order to  [phone]

## 2017-01-09 NOTE — Telephone Encounter (Signed)
Thank you!  CBC, CMET, hemoglobin a1c, TSH.

## 2017-01-09 NOTE — Telephone Encounter (Signed)
Please call pt to verify if she meant to cancel CPX>

## 2017-01-09 NOTE — Telephone Encounter (Signed)
TA-I have called pt and left her a message to call back because it looks like her CPE was cancelled although she is needing lab orders faxed to [place] for CPE and I want to get this corrected/What labs would you like her to have done so I can fax an order to them after I talk with her? Plz advise/thx dmf

## 2017-01-09 NOTE — Telephone Encounter (Signed)
Faxed order to [facility]/pt aware/thx dmf

## 2017-01-09 NOTE — Telephone Encounter (Signed)
Appears 01/09/17 CPX was cancelled and I am not sure if CPX should have been cancelled.

## 2017-01-09 NOTE — Telephone Encounter (Signed)
TA-In addition/I just talked to her/she is rescheduled for the 13th/there was a scheduling issue but I have taken care of it/What labs would you like for her to have completed? Plz advise/thx dmf

## 2017-01-12 ENCOUNTER — Other Ambulatory Visit: Payer: Self-pay | Admitting: Otolaryngology

## 2017-01-12 ENCOUNTER — Other Ambulatory Visit: Payer: Self-pay | Admitting: Family Medicine

## 2017-01-12 DIAGNOSIS — I10 Essential (primary) hypertension: Secondary | ICD-10-CM | POA: Diagnosis not present

## 2017-01-12 DIAGNOSIS — J329 Chronic sinusitis, unspecified: Secondary | ICD-10-CM

## 2017-01-12 DIAGNOSIS — E119 Type 2 diabetes mellitus without complications: Secondary | ICD-10-CM | POA: Diagnosis not present

## 2017-01-12 DIAGNOSIS — Z0000 Encounter for general adult medical examination without abnormal findings: Secondary | ICD-10-CM | POA: Diagnosis not present

## 2017-01-13 ENCOUNTER — Ambulatory Visit (INDEPENDENT_AMBULATORY_CARE_PROVIDER_SITE_OTHER): Payer: BLUE CROSS/BLUE SHIELD | Admitting: Family Medicine

## 2017-01-13 ENCOUNTER — Encounter: Payer: BLUE CROSS/BLUE SHIELD | Admitting: Family Medicine

## 2017-01-13 ENCOUNTER — Encounter: Payer: Self-pay | Admitting: Family Medicine

## 2017-01-13 VITALS — BP 128/76 | HR 80 | Temp 97.8°F | Ht 63.0 in | Wt 208.4 lb

## 2017-01-13 DIAGNOSIS — Z0000 Encounter for general adult medical examination without abnormal findings: Secondary | ICD-10-CM | POA: Diagnosis not present

## 2017-01-13 DIAGNOSIS — Z01419 Encounter for gynecological examination (general) (routine) without abnormal findings: Secondary | ICD-10-CM | POA: Insufficient documentation

## 2017-01-13 DIAGNOSIS — Z23 Encounter for immunization: Secondary | ICD-10-CM | POA: Diagnosis not present

## 2017-01-13 DIAGNOSIS — I10 Essential (primary) hypertension: Secondary | ICD-10-CM | POA: Diagnosis not present

## 2017-01-13 DIAGNOSIS — E119 Type 2 diabetes mellitus without complications: Secondary | ICD-10-CM

## 2017-01-13 DIAGNOSIS — F329 Major depressive disorder, single episode, unspecified: Secondary | ICD-10-CM | POA: Diagnosis not present

## 2017-01-13 DIAGNOSIS — Z124 Encounter for screening for malignant neoplasm of cervix: Secondary | ICD-10-CM

## 2017-01-13 DIAGNOSIS — F32A Depression, unspecified: Secondary | ICD-10-CM

## 2017-01-13 LAB — CBC WITH DIFFERENTIAL/PLATELET
BASOS: 1 %
Basophils Absolute: 0 10*3/uL (ref 0.0–0.2)
EOS (ABSOLUTE): 0.1 10*3/uL (ref 0.0–0.4)
EOS: 3 %
HEMATOCRIT: 42 % (ref 34.0–46.6)
HEMOGLOBIN: 14.2 g/dL (ref 11.1–15.9)
IMMATURE GRANS (ABS): 0 10*3/uL (ref 0.0–0.1)
Immature Granulocytes: 0 %
LYMPHS: 41 %
Lymphocytes Absolute: 1.7 10*3/uL (ref 0.7–3.1)
MCH: 28.4 pg (ref 26.6–33.0)
MCHC: 33.8 g/dL (ref 31.5–35.7)
MCV: 84 fL (ref 79–97)
MONOCYTES: 8 %
Monocytes Absolute: 0.3 10*3/uL (ref 0.1–0.9)
NEUTROS ABS: 2 10*3/uL (ref 1.4–7.0)
Neutrophils: 47 %
Platelets: 168 10*3/uL (ref 150–379)
RBC: 5 x10E6/uL (ref 3.77–5.28)
RDW: 13.6 % (ref 12.3–15.4)
WBC: 4.3 10*3/uL (ref 3.4–10.8)

## 2017-01-13 LAB — COMPREHENSIVE METABOLIC PANEL
ALBUMIN: 4.9 g/dL (ref 3.5–5.5)
ALK PHOS: 71 IU/L (ref 39–117)
ALT: 49 IU/L — ABNORMAL HIGH (ref 0–32)
AST: 29 IU/L (ref 0–40)
Albumin/Globulin Ratio: 2.3 — ABNORMAL HIGH (ref 1.2–2.2)
BILIRUBIN TOTAL: 0.4 mg/dL (ref 0.0–1.2)
BUN / CREAT RATIO: 19 (ref 9–23)
BUN: 14 mg/dL (ref 6–24)
CHLORIDE: 99 mmol/L (ref 96–106)
CO2: 23 mmol/L (ref 20–29)
Calcium: 9.6 mg/dL (ref 8.7–10.2)
Creatinine, Ser: 0.72 mg/dL (ref 0.57–1.00)
GFR calc non Af Amer: 99 mL/min/{1.73_m2} (ref >59)
GFR, EST AFRICAN AMERICAN: 114 mL/min/{1.73_m2} (ref >59)
GLOBULIN, TOTAL: 2.1 g/dL (ref 1.5–4.5)
Glucose: 141 mg/dL — ABNORMAL HIGH (ref 65–99)
POTASSIUM: 3.6 mmol/L (ref 3.5–5.2)
SODIUM: 141 mmol/L (ref 134–144)
TOTAL PROTEIN: 7 g/dL (ref 6.0–8.5)

## 2017-01-13 LAB — TSH: TSH: 1 u[IU]/mL (ref 0.450–4.500)

## 2017-01-13 LAB — SPECIMEN STATUS REPORT

## 2017-01-13 LAB — HGB A1C W/O EAG: Hgb A1c MFr Bld: 6.8 % — ABNORMAL HIGH (ref 4.8–5.6)

## 2017-01-13 MED ORDER — HYDROCHLOROTHIAZIDE 25 MG PO TABS: 25.0000 mg | ORAL_TABLET | Freq: Every morning | ORAL | 3 refills | Status: DC

## 2017-01-13 MED ORDER — METFORMIN HCL 500 MG PO TABS: ORAL_TABLET | ORAL | 3 refills | Status: DC

## 2017-01-13 MED ORDER — CITALOPRAM HYDROBROMIDE 40 MG PO TABS: 40.0000 mg | ORAL_TABLET | Freq: Every day | ORAL | 3 refills | Status: DC

## 2017-01-13 NOTE — Addendum Note (Signed)
Addended by: Ellamae Sia on: 01/13/2017 03:52 PM   Modules accepted: Orders

## 2017-01-13 NOTE — Progress Notes (Signed)
Subjective:   Patient ID: Sheri Hood, female    DOB: August 15, 1965, 51 y.o.   MRN: [id number]  Sheri Hood is a pleasant 51 y.o. year old female who presents to clinic today with Annual Exam (Patient is here today for a CPE & PAP.  She had fasting labs completed at [facility] on 11.12.2018.)  on 01/13/2017  HPI:  Pap smear 01/12/15 Remote h/o ablation Mammogram 10/01/16   Health Maintenance  Topic Date Due  . PNEUMOCOCCAL POLYSACCHARIDE VACCINE (1) 03/17/1969  . FOOT EXAM  03/17/1977  . HIV Screening  03/17/1982  . HEMOGLOBIN A1C  12/13/2016  . URINE MICROALBUMIN  03/05/2017  . OPHTHALMOLOGY EXAM  05/26/2017  . MAMMOGRAM  09/30/2017  . PAP SMEAR  01/11/2018  . TETANUS/TDAP  01/10/2024  . INFLUENZA VACCINE  Completed   Depression- has been well controlled with Celexa 40 mg daily.  Diabetes- diagnosed in 11/2015  On Metformin 500 mg twice daily Lab Results  Component Value Date   HGBA1C 6.3 (H) 06/13/2016    Current Outpatient Medications on File Prior to Visit  Medication Sig Dispense Refill  . BAYER MICROLET LANCETS lancets Use as instructed to test blood sugar once daily. E11.9 100 each 12  . citalopram (CELEXA) 40 MG tablet Take 40 mg by mouth daily.    . citalopram (CELEXA) 40 MG tablet TAKE ONE TABLET BY MOUTH AT BEDTIME 30 tablet 0  . glucose blood (BAYER CONTOUR NEXT TEST) test strip Use as instructed to test blood sugar once daily. E11.9 100 each 12  . hydrochlorothiazide (HYDRODIURIL) 25 MG tablet Take 1 tablet (25 mg total) by mouth every morning. 90 tablet 3  . ibuprofen (ADVIL,MOTRIN) 200 MG tablet Take 200 mg by mouth every 6 (six) hours as needed. For pain    . metFORMIN (GLUCOPHAGE) 500 MG tablet TAKE 1 TABLET BY MOUTH TWICE DAILY WITH A MEAL 180 tablet 0  . omeprazole (PRILOSEC) 20 MG capsule Take 20 mg by mouth daily.    . vitamin E 400 UNIT capsule Take 800 Units by mouth at bedtime.      No current facility-administered medications on file prior  to visit.     Allergies  Allergen Reactions  . Benadryl [Diphenhydramine Hcl] Other (See Comments)    Keeps awake, jittery  . Codeine Other (See Comments)    Keeps awake  . Dilaudid [Hydromorphone Hcl] Itching and Nausea Only  . Morphine And Related Itching and Nausea Only    Past Medical History:  Diagnosis Date  . Anemia    s/p ablation for heavy bleeding   . Arthritis    arhtritis middle back, scolosis  . Asthma   . Headache(784.0)    h/x migraines  . Heart murmur    recent diagnosed- / ehho 12/30/10 report on chart  . Hiatal hernia   . Hypertension   . Recurrent upper respiratory infection (URI)    occ sore throat and slightly stuffy- no fever or cough  . Sleep apnea   . Sleep apnea     Past Surgical History:  Procedure Laterality Date  . CHOLECYSTECTOMY    . ENDOMETRIAL ABLATION  2008  . FRACTURE SURGERY    . TONSILLECTOMY      Family History  Problem Relation Age of Onset  . Diabetes Father   . Breast cancer Neg Hx     Social History   Socioeconomic History  . Marital status: Married    Spouse name: Not on file  .  Number of children: Not on file  . Years of education: Not on file  . Highest education level: Not on file  Social Needs  . Financial resource strain: Not on file  . Food insecurity - worry: Not on file  . Food insecurity - inability: Not on file  . Transportation needs - medical: Not on file  . Transportation needs - non-medical: Not on file  Occupational History  . Not on file  Tobacco Use  . Smoking status: Former Smoker    Packs/day: 0.50    Years: 4.00    Pack years: 2.00    Types: Cigarettes    Last attempt to quit: 01/02/1988    Years since quitting: 29.0  . Smokeless tobacco: Never Used  Substance and Sexual Activity  . Alcohol use: Yes    Alcohol/week: 0.0 oz  . Drug use: No  . Sexual activity: No  Other Topics Concern  . Not on file  Social History Narrative  . Not on file   The PMH, PSH, Social History, Family  History, Medications, and allergies have been reviewed in [facility], and have been updated if relevant.  Review of Systems  Constitutional: Negative.   HENT: Negative.   Eyes: Negative.   Respiratory: Negative.   Cardiovascular: Negative.   Gastrointestinal: Negative.   Endocrine: Negative.   Genitourinary: Negative.   Musculoskeletal: Negative.   Skin: Negative.   Allergic/Immunologic: Negative.   Neurological: Negative.   Psychiatric/Behavioral: Negative.   All other systems reviewed and are negative.      Objective:    BP 128/76 (BP Location: Left Arm, Patient Position: Sitting, Cuff Size: Normal)   Pulse 80   Temp 97.8 F (36.6 C) (Oral)   Ht 5\' 3"  (1.6 m)   Wt 208 lb 6.4 oz (94.5 kg)   SpO2 97%   BMI 36.92 kg/m    Physical Exam   General:  Well-developed,well-nourished,in no acute distress; alert,appropriate and cooperative throughout examination Head:  normocephalic and atraumatic.   Eyes:  vision grossly intact, PERRL Ears:  R ear normal and L ear normal externally, TMs clear bilaterally Nose:  no external deformity.   Mouth:  good dentition.   Neck:  No deformities, masses, or tenderness noted. Breasts:  No mass, nodules, thickening, tenderness, bulging, retraction, inflamation, nipple discharge or skin changes noted.   Lungs:  Normal respiratory effort, chest expands symmetrically. Lungs are clear to auscultation, no crackles or wheezes. Heart:  Normal rate and regular rhythm. S1 and S2 normal without gallop, murmur, click, rub or other extra sounds. Abdomen:  Bowel sounds positive,abdomen soft and non-tender without masses, organomegaly or hernias noted. Rectal:  no external abnormalities.   Genitalia:  Pelvic Exam:        External: normal female genitalia without lesions or masses        Vagina: normal without lesions or masses        Cervix: normal without lesions or masses        Adnexa: normal bimanual exam without masses or fullness        Uterus: normal  by palpation        Pap smear: performed Msk:  No deformity or scoliosis noted of thoracic or lumbar spine.   Extremities:  No clubbing, cyanosis, edema, or deformity noted with normal full range of motion of all joints.   Neurologic:  alert & oriented X3 and gait normal.   Skin:  Intact without suspicious lesions or rashes Cervical Nodes:  No lymphadenopathy noted  Axillary Nodes:  No palpable lymphadenopathy Psych:  Cognition and judgment appear intact. Alert and cooperative with normal attention span and concentration. No apparent delusions, illusions, hallucinations       Assessment & Plan:   Essential hypertension  New onset type 2 diabetes mellitus ([place])  Routine general medical examination at a health care facility  Depression, unspecified depression type  Well woman exam - Plan: Cytology - PAP  Screening for cervical cancer - Plan: Cytology - PAP No Follow-up on file.

## 2017-01-13 NOTE — Assessment & Plan Note (Signed)
Has had labs drawn at work- we are awaiting those results. Pneumovax given today. Continue current dose of Metformin- eRx refill sent.

## 2017-01-13 NOTE — Patient Instructions (Signed)
Great to see you. We will call [facility] to get your lab results sent to [place].

## 2017-01-13 NOTE — Assessment & Plan Note (Signed)
Reviewed preventive care protocols, scheduled due services, and updated immunizations Discussed nutrition, exercise, diet, and healthy lifestyle.  

## 2017-01-13 NOTE — Assessment & Plan Note (Signed)
Normotensive. No changes made- continue current dose of HCTZ.

## 2017-01-13 NOTE — Assessment & Plan Note (Signed)
Well controlled with current dose of celexa. No changes made- eRx refills sent.

## 2017-01-13 NOTE — Addendum Note (Signed)
Addended by: Marrion Coy on: 01/13/2017 03:37 PM   Modules accepted: Orders

## 2017-01-16 ENCOUNTER — Ambulatory Visit
Admission: RE | Admit: 2017-01-16 | Discharge: 2017-01-16 | Disposition: A | Discharge status: Home / Self Care | Payer: BLUE CROSS/BLUE SHIELD | Source: Ambulatory Visit | Attending: Otolaryngology | Admitting: Otolaryngology

## 2017-01-16 DIAGNOSIS — J329 Chronic sinusitis, unspecified: Secondary | ICD-10-CM | POA: Diagnosis not present

## 2017-01-18 LAB — PAPIG, CTNGTVHSV,HPV,RFX16/18
CHLAMYDIA, NUC. ACID AMP: NEGATIVE
GONOCOCCUS, NUC. ACID AMP: NEGATIVE
HPV, high-risk: NEGATIVE
HSV 1 NAA: NEGATIVE
HSV 2 NAA: NEGATIVE
PAP Smear Comment: 0
TRICH VAG BY NAA: NEGATIVE

## 2017-01-21 DIAGNOSIS — J301 Allergic rhinitis due to pollen: Secondary | ICD-10-CM | POA: Diagnosis not present

## 2017-02-11 ENCOUNTER — Encounter: Payer: Self-pay | Admitting: Family Medicine

## 2017-04-17 ENCOUNTER — Ambulatory Visit: Payer: Self-pay | Admitting: Adult Health

## 2017-04-17 ENCOUNTER — Encounter: Payer: Self-pay | Admitting: Adult Health

## 2017-04-17 VITALS — BP 143/67 | HR 73 | Temp 98.5°F | Resp 16 | Ht 62.0 in | Wt 210.0 lb

## 2017-04-17 DIAGNOSIS — J0191 Acute recurrent sinusitis, unspecified: Secondary | ICD-10-CM

## 2017-04-17 MED ORDER — PREDNISONE 10 MG (21) PO TBPK: ORAL_TABLET | ORAL | 0 refills | Status: DC

## 2017-04-17 MED ORDER — AZITHROMYCIN 250 MG PO TABS: ORAL_TABLET | ORAL | 0 refills | Status: DC

## 2017-04-17 NOTE — Patient Instructions (Signed)
Otitis Media, Adult Otitis media is redness, soreness, and puffiness (swelling) in the space just behind your eardrum (middle ear). It may be caused by allergies or infection. It often happens along with a cold. Follow these instructions at home:  Take your medicine as told. Finish it even if you start to feel better.  Only take over-the-counter or prescription medicines for pain, discomfort, or fever as told by your doctor.  Follow up with your doctor as told. Contact a doctor if:  You have otitis media only in one ear, or bleeding from your nose, or both.  You notice a lump on your neck.  You are not getting better in 3-5 days.  You feel worse instead of better. Get help right away if:  You have pain that is not helped with medicine.  You have puffiness, redness, or pain around your ear.  You get a stiff neck.  You cannot move part of your face (paralysis).  You notice that the bone behind your ear hurts when you touch it. This information is not intended to replace advice given to you by your health care provider. Make sure you discuss any questions you have with your health care provider. Document Released: 08/06/2007 Document Revised: 07/26/2015 Document Reviewed: 09/14/2012 Elsevier Interactive Patient Education  2017 [employer]  Upper Respiratory Infection, Adult Most upper respiratory infections (URIs) are caused by a virus. A URI affects the nose, throat, and upper air passages. The most common type of URI is often called "the common cold." Follow these instructions at home:  Take medicines only as told by your doctor.  Gargle warm saltwater or take cough drops to comfort your throat as told by your doctor.  Use a warm mist humidifier or inhale steam from a shower to increase air moisture. This may make it easier to breathe.  Drink enough fluid to keep your pee (urine) clear or pale yellow.  Eat soups and other clear broths.  Have a healthy diet.  Rest as  needed.  Go back to work when your fever is gone or your doctor says it is okay. ? You may need to stay home longer to avoid giving your URI to others. ? You can also wear a face mask and wash your hands often to prevent spread of the virus.  Use your inhaler more if you have asthma.  Do not use any tobacco products, including cigarettes, chewing tobacco, or electronic cigarettes. If you need help quitting, ask your doctor. Contact a doctor if:  You are getting worse, not better.  Your symptoms are not helped by medicine.  You have chills.  You are getting more short of breath.  You have brown or red mucus.  You have yellow or brown discharge from your nose.  You have pain in your face, especially when you bend forward.  You have a fever.  You have puffy (swollen) neck glands.  You have pain while swallowing.  You have white areas in the back of your throat. Get help right away if:  You have very bad or constant: ? Headache. ? Ear pain. ? Pain in your forehead, behind your eyes, and over your cheekbones (sinus pain). ? Chest pain.  You have long-lasting (chronic) lung disease and any of the following: ? Wheezing. ? Long-lasting cough. ? Coughing up blood. ? A change in your usual mucus.  You have a stiff neck.  You have changes in your: ? Vision. ? Hearing. ? Thinking. ? Mood. This information   is not intended to replace advice given to you by your health care provider. Make sure you discuss any questions you have with your health care provider. Document Released: 08/06/2007 Document Revised: 10/21/2015 Document Reviewed: 05/25/2013 Elsevier Interactive Patient Education  2018 [employer] Inc.  

## 2017-04-17 NOTE — Progress Notes (Signed)
Subjective:     Patient ID: Sheri Hood, female   DOB: [birth year], 52 y.o.   MRN: [id number]  Blood pressure (!) 143/67, pulse 73, temperature 98.5 F (36.9 C), resp. rate 16, height 5\' 2"  (1.575 m), weight 210 lb (95.3 kg), SpO2 98 %.  Sinusitis  This is a new problem. The current episode started 1 to 4 weeks ago (started february 1st ). The problem has been gradually worsening since onset. There has been no fever (she reports she was cold last night and this is not normal for her ). Her pain is at a severity of 0/10. She is experiencing no pain. Associated symptoms include congestion (chest and head congestion / post nasal drip as well ), coughing (productive cough at times yellow/ green sputum ), ear pain, headaches (mild ), sinus pressure and a sore throat. Pertinent negatives include no chills, diaphoresis, hoarse voice, neck pain, shortness of breath, sneezing or swollen glands. (Family has all had same symptoms- grandson drinks after her he is 66 years old ) Treatments tried: mucinex only / tylenol cold occasionally / ocean spray mist  The treatment provided mild relief.   Denies any wheezing. She did use inhaler once for mild chest tightness with congestion and this did relieve her symptoms. She denies need for refill on Albuterol inhaler.  She is talking Zyrtec daily.  Allergies  Allergen Reactions  . Benadryl [Diphenhydramine Hcl] Other (See Comments)    Keeps awake, jittery  . Codeine Other (See Comments)    Keeps awake  . Dilaudid [Hydromorphone Hcl] Itching and Nausea Only  . Morphine And Related Itching and Nausea Only     Patient  denies any fever, chills, rash, chest pain, shortness of breath, nausea, vomiting, or diarrhea.  Patient Active Problem List   Diagnosis Date Noted  . Well woman exam 01/13/2017  . New onset type 2 diabetes mellitus ([place]) 12/04/2015  . Sleep disturbance 02/08/2014  . Depression 01/09/2014  . Fibroid uterus 01/09/2014  . Vitamin D deficiency  01/09/2014  . HTN (hypertension) 09/09/2013  . Recurrent nephrolithiasis 07/05/2012    Current Outpatient Medications:  .  citalopram (CELEXA) 40 MG tablet, Take 1 tablet (40 mg total) daily by mouth., Disp: 90 tablet, Rfl: 3 .  glucose blood (BAYER CONTOUR NEXT TEST) test strip, Use as instructed to test blood sugar once daily. E11.9, Disp: 100 each, Rfl: 12 .  hydrochlorothiazide (HYDRODIURIL) 25 MG tablet, Take 1 tablet (25 mg total) every morning by mouth., Disp: 90 tablet, Rfl: 3 .  ibuprofen (ADVIL,MOTRIN) 200 MG tablet, Take 200 mg by mouth every 6 (six) hours as needed. For pain, Disp: , Rfl:  .  metFORMIN (GLUCOPHAGE) 500 MG tablet, TAKE 1 TABLET BY MOUTH TWICE DAILY WITH A MEAL, Disp: 180 tablet, Rfl: 3 .  omeprazole (PRILOSEC) 20 MG capsule, Take 20 mg by mouth daily., Disp: , Rfl:  .  vitamin E 400 UNIT capsule, Take 800 Units by mouth at bedtime. , Disp: , Rfl:  .  BAYER MICROLET LANCETS lancets, Use as instructed to test blood sugar once daily. E11.9, Disp: 100 each, Rfl: 12  Review of Systems  Constitutional: Negative for activity change, appetite change, chills, diaphoresis, fatigue, fever and unexpected weight change.  HENT: Positive for congestion (chest and head congestion / post nasal drip as well ), ear pain, sinus pressure and sore throat. Negative for dental problem, drooling, ear discharge, facial swelling, hearing loss, hoarse voice, mouth sores, nosebleeds, postnasal drip, rhinorrhea, sinus pain,  sneezing, tinnitus, trouble swallowing and voice change.   Respiratory: Positive for cough (productive cough at times yellow/ green sputum ). Negative for apnea, choking, chest tightness, shortness of breath, wheezing and stridor.   Cardiovascular: Negative.   Gastrointestinal: Negative.   Genitourinary: Negative.   Musculoskeletal: Negative for neck pain.  Skin: Negative.   Neurological: Positive for headaches (mild ).  Hematological: Negative.   Psychiatric/Behavioral:  Negative.        Objective:   Physical Exam  Constitutional: She is oriented to person, place, and time. Vital signs are normal. She appears well-developed and well-nourished. She is active.  Non-toxic appearance. She does not have a sickly appearance. She does not appear ill. No distress.  HENT:  Head: Normocephalic and atraumatic.  Right Ear: Hearing, external ear and ear canal normal. No drainage, swelling or tenderness. Tympanic membrane is erythematous. Tympanic membrane is not perforated. A middle ear effusion (mild effusion with darker yellow half way down behind tympanic membrane  other above is clear fluid ) is present.  Left Ear: Hearing, tympanic membrane, external ear and ear canal normal.  Nose: Mucosal edema and rhinorrhea present. Right sinus exhibits maxillary sinus tenderness. Right sinus exhibits no frontal sinus tenderness. Left sinus exhibits maxillary sinus tenderness. Left sinus exhibits no frontal sinus tenderness.  Mouth/Throat: Oropharynx is clear and moist. No oropharyngeal exudate.  Eyes: Conjunctivae and EOM are normal. Pupils are equal, round, and reactive to light. Right eye exhibits no discharge. Left eye exhibits no discharge. No scleral icterus.  Neck: Normal range of motion. Neck supple. No JVD present. No tracheal deviation present.  Cardiovascular: Normal rate, regular rhythm, normal heart sounds and intact distal pulses. Exam reveals no gallop and no friction rub.  No murmur heard. Pulmonary/Chest: Effort normal and breath sounds normal. No accessory muscle usage or stridor. No respiratory distress. She has no wheezes. She has no rales. She exhibits no tenderness.  Mild bronchial congestion audible by ear while talking with patient.  No adventitious sounds auscultate throughout.   Abdominal: Soft. Bowel sounds are normal.  Musculoskeletal: Normal range of motion.  Patient moves on and off of exam table and in room without difficulty. Gait is normal in hall  and in room. Patient is oriented to person place time and situation. Patient answers questions appropriately and engages in conversation.   Lymphadenopathy:    She has no cervical adenopathy.  Neurological: She is alert and oriented to person, place, and time. She has normal strength. She displays normal reflexes. No cranial nerve deficit. She exhibits normal muscle tone. Coordination normal.  Skin: Skin is warm and dry. No rash noted. She is not diaphoretic. No cyanosis or erythema. No pallor. Nails show no clubbing.  Psychiatric: She has a normal mood and affect. Her behavior is normal. Judgment and thought content normal.  Vitals reviewed.      Assessment:    Acute recurrent sinusitis, unspecified location   She recently saw ENT Dr. Pryor Ochoa    CT scan was normal on 01/16/18 in Epic  Plan:         Meds ordered this encounter  Medications  . predniSONE (STERAPRED UNI-PAK 21 TAB) 10 MG (21) TBPK tablet    Sig: By mouth Take 6 tablets on day 1, Take 5 tablets day 2 Take 4 tablets day 3 Take 3 tablets day 4 Take 2 tablets day five 5 Take 1 tablet day    Dispense:  21 tablet    Refill:  0  . azithromycin (  ZITHROMAX) 250 MG tablet    Sig: Take 2 tablets ( 500 mg) day 1 , take 1 tablet days 2,3,4,5    Dispense:  6 tablet    Refill:  0   She will hold Celexa while on Z-Pack due to risk for prolonged QT. She denies any suicidal or homicidal ideations.  Advised to return to clinic for an appointment if no improvement within 72 hours or if any symptoms change or worsen. Advised ER or urgent Care if after hours or on weekend. 911 for emergency symptoms at any time.  Patient verbalized understanding of all instructions given and denies any further questions at this time.

## 2017-08-04 ENCOUNTER — Telehealth: Payer: Self-pay

## 2017-08-04 NOTE — Telephone Encounter (Signed)
Copied from [place] [id number]. Topic: Appointment Scheduling - Scheduling Inquiry for Clinic >> Aug 04, 2017  3:40 PM Sheri Hood wrote: Reason for CRM: pt states she used to see Dr Sheri Hood at [facility].  Pt started seeing Dr Sheri Hood once she left [place].  but does not want to travel to [place]. Pt would like to know if Dr Sheri Hood will accept her, her husband and her daughter as pts?  (all 3 family members)

## 2017-08-05 NOTE — Telephone Encounter (Signed)
Staff msg sent to provider

## 2017-08-24 DIAGNOSIS — D225 Melanocytic nevi of trunk: Secondary | ICD-10-CM | POA: Diagnosis not present

## 2017-08-24 DIAGNOSIS — L820 Inflamed seborrheic keratosis: Secondary | ICD-10-CM | POA: Diagnosis not present

## 2017-08-24 DIAGNOSIS — D2272 Melanocytic nevi of left lower limb, including hip: Secondary | ICD-10-CM | POA: Diagnosis not present

## 2017-08-24 DIAGNOSIS — D2230 Melanocytic nevi of unspecified part of face: Secondary | ICD-10-CM | POA: Diagnosis not present

## 2017-08-24 DIAGNOSIS — L301 Dyshidrosis [pompholyx]: Secondary | ICD-10-CM | POA: Diagnosis not present

## 2017-08-24 DIAGNOSIS — D2271 Melanocytic nevi of right lower limb, including hip: Secondary | ICD-10-CM | POA: Diagnosis not present

## 2017-08-24 DIAGNOSIS — D485 Neoplasm of uncertain behavior of skin: Secondary | ICD-10-CM | POA: Diagnosis not present

## 2017-08-24 DIAGNOSIS — Z1283 Encounter for screening for malignant neoplasm of skin: Secondary | ICD-10-CM | POA: Diagnosis not present

## 2017-10-01 ENCOUNTER — Encounter: Payer: Self-pay | Admitting: Internal Medicine

## 2017-10-01 ENCOUNTER — Ambulatory Visit: Payer: BLUE CROSS/BLUE SHIELD | Admitting: Internal Medicine

## 2017-10-01 DIAGNOSIS — D500 Iron deficiency anemia secondary to blood loss (chronic): Secondary | ICD-10-CM

## 2017-10-01 DIAGNOSIS — G43C1 Periodic headache syndromes in child or adult, intractable: Secondary | ICD-10-CM | POA: Diagnosis not present

## 2017-10-01 DIAGNOSIS — J4520 Mild intermittent asthma, uncomplicated: Secondary | ICD-10-CM

## 2017-10-01 DIAGNOSIS — K219 Gastro-esophageal reflux disease without esophagitis: Secondary | ICD-10-CM

## 2017-10-01 DIAGNOSIS — G43909 Migraine, unspecified, not intractable, without status migrainosus: Secondary | ICD-10-CM | POA: Insufficient documentation

## 2017-10-01 DIAGNOSIS — M1990 Unspecified osteoarthritis, unspecified site: Secondary | ICD-10-CM

## 2017-10-01 DIAGNOSIS — F419 Anxiety disorder, unspecified: Secondary | ICD-10-CM

## 2017-10-01 DIAGNOSIS — J45909 Unspecified asthma, uncomplicated: Secondary | ICD-10-CM | POA: Insufficient documentation

## 2017-10-01 DIAGNOSIS — G4733 Obstructive sleep apnea (adult) (pediatric): Secondary | ICD-10-CM

## 2017-10-01 DIAGNOSIS — E119 Type 2 diabetes mellitus without complications: Secondary | ICD-10-CM

## 2017-10-01 DIAGNOSIS — F32A Depression, unspecified: Secondary | ICD-10-CM

## 2017-10-01 DIAGNOSIS — F329 Major depressive disorder, single episode, unspecified: Secondary | ICD-10-CM

## 2017-10-01 DIAGNOSIS — D649 Anemia, unspecified: Secondary | ICD-10-CM | POA: Insufficient documentation

## 2017-10-01 DIAGNOSIS — I10 Essential (primary) hypertension: Secondary | ICD-10-CM

## 2017-10-01 NOTE — Assessment & Plan Note (Signed)
Continue Omeprazole Encouraged weight loss

## 2017-10-01 NOTE — Patient Instructions (Signed)
Fat and Cholesterol Restricted Diet Getting too much fat and cholesterol in your diet may cause health problems. Following this diet helps keep your fat and cholesterol at normal levels. This can keep you from getting sick. What types of fat should I choose?  Choose monosaturated and polyunsaturated fats. These are found in foods such as olive oil, canola oil, flaxseeds, walnuts, almonds, and seeds.  Eat more omega-3 fats. Good choices include salmon, mackerel, sardines, tuna, flaxseed oil, and ground flaxseeds.  Limit saturated fats. These are in animal products such as meats, butter, and cream. They can also be in plant products such as palm oil, palm kernel oil, and coconut oil.  Avoid foods with partially hydrogenated oils in them. These contain trans fats. Examples of foods that have trans fats are stick margarine, some tub margarines, cookies, crackers, and other baked goods. What general guidelines do I need to follow?  Check food labels. Look for the words "trans fat" and "saturated fat."  When preparing a meal: ? Fill half of your plate with vegetables and green salads. ? Fill one fourth of your plate with whole grains. Look for the word "whole" as the first word in the ingredient list. ? Fill one fourth of your plate with lean protein foods.  Eat more foods that have fiber, like apples, carrots, beans, peas, and barley.  Eat more home-cooked foods. Eat less at restaurants and buffets.  Limit or avoid alcohol.  Limit foods high in starch and sugar.  Limit fried foods.  Cook foods without frying them. Baking, boiling, grilling, and broiling are all great options.  Lose weight if you are overweight. Losing even a small amount of weight can help your overall health. It can also help prevent diseases such as diabetes and heart disease. What foods can I eat? Grains Whole grains, such as whole wheat or whole grain breads, crackers, cereals, and pasta. Unsweetened oatmeal,  bulgur, barley, quinoa, or brown rice. Corn or whole wheat flour tortillas. Vegetables Fresh or frozen vegetables (raw, steamed, roasted, or grilled). Green salads. Fruits All fresh, canned (in natural juice), or frozen fruits. Meat and Other Protein Products Ground beef (85% or leaner), grass-fed beef, or beef trimmed of fat. Skinless chicken or turkey. Ground chicken or turkey. Pork trimmed of fat. All fish and seafood. Eggs. Dried beans, peas, or lentils. Unsalted nuts or seeds. Unsalted canned or dry beans. Dairy Low-fat dairy products, such as skim or 1% milk, 2% or reduced-fat cheeses, low-fat ricotta or cottage cheese, or plain low-fat yogurt. Fats and Oils Tub margarines without trans fats. Light or reduced-fat mayonnaise and salad dressings. Avocado. Olive, canola, sesame, or safflower oils. Natural peanut or almond butter (choose ones without added sugar and oil). The items listed above may not be a complete list of recommended foods or beverages. Contact your dietitian for more options. What foods are not recommended? Grains White bread. White pasta. White rice. Cornbread. Bagels, pastries, and croissants. Crackers that contain trans fat. Vegetables White potatoes. Corn. Creamed or fried vegetables. Vegetables in a cheese sauce. Fruits Dried fruits. Canned fruit in light or heavy syrup. Fruit juice. Meat and Other Protein Products Fatty cuts of meat. Ribs, chicken wings, bacon, sausage, bologna, salami, chitterlings, fatback, hot dogs, bratwurst, and packaged luncheon meats. Liver and organ meats. Dairy Whole or 2% milk, cream, half-and-half, and cream cheese. Whole milk cheeses. Whole-fat or sweetened yogurt. Full-fat cheeses. Nondairy creamers and whipped toppings. Processed cheese, cheese spreads, or cheese curds. Sweets and Desserts Corn   syrup, sugars, honey, and molasses. Candy. Jam and jelly. Syrup. Sweetened cereals. Cookies, pies, cakes, donuts, muffins, and ice  cream. Fats and Oils Butter, stick margarine, lard, shortening, ghee, or bacon fat. Coconut, palm kernel, or palm oils. Beverages Alcohol. Sweetened drinks (such as sodas, lemonade, and fruit drinks or punches). The items listed above may not be a complete list of foods and beverages to avoid. Contact your dietitian for more information. This information is not intended to replace advice given to you by your health care provider. Make sure you discuss any questions you have with your health care provider. Document Released: 08/19/2011 Document Revised: 10/25/2015 Document Reviewed: 05/19/2013 Elsevier Interactive Patient Education  2018 [employer] Inc.  

## 2017-10-01 NOTE — Assessment & Plan Note (Signed)
Controlled on Citalopram Will monitor

## 2017-10-01 NOTE — Assessment & Plan Note (Signed)
Encouraged low care diet and exercise for weight loss Continue Metformin Will get A1C and microalbumin yearly at annual exam Encouraged yearly flu shot Pneumovax UTD Foot exam today Encouraged yearly eye exam

## 2017-10-01 NOTE — Assessment & Plan Note (Signed)
Continue Ibuprofen as needed. 

## 2017-10-01 NOTE — Assessment & Plan Note (Signed)
Continue Ibuprofen Handicap placard filled out today

## 2017-10-01 NOTE — Assessment & Plan Note (Signed)
Continue CPAP   Encouraged weight loss

## 2017-10-01 NOTE — Progress Notes (Signed)
HPI  Pt presents to the clinic today to establish care and for management of the conditions listed below.  Anemia: Her last H/H was 14.2/42, 01/2017. She had a uterine ablation for heavy menstrual bleeding. She is not taking any iron supplements OTC.  Arthritis: Mainly in her back and ankles. She takes Ibuprofen as needed with good relief. She is not currently following with orthopedics.  Asthma: She denies chronic cough or shortness of breath. She uses an Albuterol inhaler very sparingly. There is no spirometry on file.  Migraines: These occur every few months. They are located in the forehead. She describes the pain as throbbing. She reports associated nausea and sensitivity to light and sound. She denies dizziness, vision changes or vomiting. She takes Ibuprofen as needed with good relief.  HTN: Her BP today is 124/84. She is taking HCTZ as prescribed. ECG from 11/2010 reviewed.  OSA: She averages 7-8  hours of sleep per night with the use of her CPAP. She feels rested when she wakes up. There is no sleep study on file.  GERD: Triggered by eating late at night. She denies breakthrough on Omeprazole. There is no upper GI on file.  DM 2: Her last A1C was 6.8%, 01/2017. She is taking Metformin as prescribed. She does not check her sugars routinely. She checks her feet as needed. Her last eye exam was 10/2016.  Anxiety and Depression: Triggered by the death of her son. She takes Citalopram daily with good relief.  Flu:12/2016 Tetanus: 01/2014 Pneumovax: 01/2017 Pap Smear: 01/2017 Mammogram: 10/2016 Colon Screening: never Vision Screening: annually Dentist: biannually  Past Medical History:  Diagnosis Date  . Anemia    s/p ablation for heavy bleeding   . Arthritis    arhtritis middle back, scolosis  . Asthma   . Headache(784.0)    h/x migraines  . Heart murmur    recent diagnosed- / ehho 12/30/10 report on chart  . Hiatal hernia   . Hypertension   . Recurrent upper respiratory  infection (URI)    occ sore throat and slightly stuffy- no fever or cough  . Sleep apnea   . Sleep apnea     Current Outpatient Medications  Medication Sig Dispense Refill  . albuterol (PROVENTIL HFA) 108 (90 Base) MCG/ACT inhaler Inhale into the lungs.    Marland Kitchen azithromycin (ZITHROMAX) 250 MG tablet Take 2 tablets ( 500 mg) day 1 , take 1 tablet days 2,3,4,5 6 tablet 0  . BAYER MICROLET LANCETS lancets Use as instructed to test blood sugar once daily. E11.9 100 each 12  . citalopram (CELEXA) 40 MG tablet Take 1 tablet (40 mg total) daily by mouth. 90 tablet 3  . glucose blood (BAYER CONTOUR NEXT TEST) test strip Use as instructed to test blood sugar once daily. E11.9 100 each 12  . hydrochlorothiazide (HYDRODIURIL) 25 MG tablet Take 1 tablet (25 mg total) every morning by mouth. 90 tablet 3  . ibuprofen (ADVIL,MOTRIN) 200 MG tablet Take 200 mg by mouth every 6 (six) hours as needed. For pain    . metFORMIN (GLUCOPHAGE) 500 MG tablet TAKE 1 TABLET BY MOUTH TWICE DAILY WITH A MEAL 180 tablet 3  . omeprazole (PRILOSEC) 20 MG capsule Take 20 mg by mouth daily.    . predniSONE (STERAPRED UNI-PAK 21 TAB) 10 MG (21) TBPK tablet By mouth Take 6 tablets on day 1, Take 5 tablets day 2 Take 4 tablets day 3 Take 3 tablets day 4 Take 2 tablets day five 5 Take  1 tablet day 21 tablet 0  . vitamin E 400 UNIT capsule Take 800 Units by mouth at bedtime.      No current facility-administered medications for this visit.     Allergies  Allergen Reactions  . Benadryl [Diphenhydramine Hcl] Other (See Comments)    Keeps awake, jittery  . Codeine Other (See Comments)    Keeps awake  . Dilaudid [Hydromorphone Hcl] Itching and Nausea Only  . Morphine And Related Itching and Nausea Only    Family History  Problem Relation Age of Onset  . Diabetes Father   . Breast cancer Neg Hx     Social History   Socioeconomic History  . Marital status: Married    Spouse name: Not on file  . Number of children: Not on  file  . Years of education: Not on file  . Highest education level: Not on file  Occupational History  . Not on file  Social Needs  . Financial resource strain: Not on file  . Food insecurity:    Worry: Not on file    Inability: Not on file  . Transportation needs:    Medical: Not on file    Non-medical: Not on file  Tobacco Use  . Smoking status: Former Smoker    Packs/day: 0.50    Years: 4.00    Pack years: 2.00    Types: Cigarettes    Last attempt to quit: 01/02/1988    Years since quitting: 29.7  . Smokeless tobacco: Never Used  Substance and Sexual Activity  . Alcohol use: Yes    Alcohol/week: 0.0 oz  . Drug use: No  . Sexual activity: Never  Lifestyle  . Physical activity:    Days per week: Not on file    Minutes per session: Not on file  . Stress: Not on file  Relationships  . Social connections:    Talks on phone: Not on file    Gets together: Not on file    Attends religious service: Not on file    Active member of club or organization: Not on file    Attends meetings of clubs or organizations: Not on file    Relationship status: Not on file  . Intimate partner violence:    Fear of current or ex partner: Not on file    Emotionally abused: Not on file    Physically abused: Not on file    Forced sexual activity: Not on file  Other Topics Concern  . Not on file  Social History Narrative  . Not on file    ROS:  Constitutional: Denies fever, malaise, fatigue, headache or abrupt weight changes.  HEENT: Denies eye pain, eye redness, ear pain, ringing in the ears, wax buildup, runny nose, nasal congestion, bloody nose, or sore throat. Respiratory: Denies difficulty breathing, shortness of breath, cough or sputum production.   Cardiovascular: Denies chest pain, chest tightness, palpitations or swelling in the hands or feet.  Gastrointestinal: Denies abdominal pain, bloating, constipation, diarrhea or blood in the stool.  GU: Denies frequency, urgency, pain  with urination, blood in urine, odor or discharge. Musculoskeletal: Pt reports joint pain. Denies decrease in range of motion, difficulty with gait, muscle pain or joint swelling.  Skin: Denies redness, rashes, lesions or ulcercations.  Neurological: Denies dizziness, difficulty with memory, difficulty with speech or problems with balance and coordination.  Psych: Pt reports anxiety and depression. Denies SI/HI.  No other specific complaints in a complete review of systems (except as listed in  HPI above).  PE:  BP 124/84   Pulse 75   Temp 98.1 F (36.7 C) (Oral)   Wt 210 lb (95.3 kg)   SpO2 98%   BMI 38.41 kg/m   Wt Readings from Last 3 Encounters:  04/17/17 210 lb (95.3 kg)  01/13/17 208 lb 6.4 oz (94.5 kg)  01/01/17 209 lb 9.6 oz (95.1 kg)    General: Appears her stated age, obese in NAD. Cardiovascular: Normal rate and rhythm. S1,S2 noted.  No murmur, rubs or gallops noted. Pulmonary/Chest: Normal effort and positive vesicular breath sounds. No respiratory distress. No wheezes, rales or ronchi noted.  Abdomen: Soft and nontender. Normal bowel sounds. Musculoskeletal: Limping with normal gait. Neurological: Alert and oriented.  Psychiatric: Mood and affect normal. Behavior is normal. Judgment and thought content normal.    BMET    Component Value Date/Time   NA 141 01/12/2017 1324   K 3.6 01/12/2017 1324   CL 99 01/12/2017 1324   CO2 23 01/12/2017 1324   GLUCOSE 141 (H) 01/12/2017 1324   BUN 14 01/12/2017 1324   CREATININE 0.72 01/12/2017 1324   CALCIUM 9.6 01/12/2017 1324   GFRNONAA 99 01/12/2017 1324   GFRAA 114 01/12/2017 1324    Lipid Panel     Component Value Date/Time   CHOL 196 06/13/2016 0817   TRIG 114 06/13/2016 0817   HDL 45 06/13/2016 0817   CHOLHDL 5.3 (H) 03/05/2016 1313   LDLCALC 128 (H) 06/13/2016 0817    CBC    Component Value Date/Time   WBC 4.3 01/12/2017 1324   RBC 5.00 01/12/2017 1324   HGB 14.2 01/12/2017 1324   HCT 42.0  01/12/2017 1324   PLT 168 01/12/2017 1324   MCV 84 01/12/2017 1324   MCH 28.4 01/12/2017 1324   MCHC 33.8 01/12/2017 1324   RDW 13.6 01/12/2017 1324   LYMPHSABS 1.7 01/12/2017 1324   EOSABS 0.1 01/12/2017 1324   BASOSABS 0.0 01/12/2017 1324    Hgb A1C Lab Results  Component Value Date   HGBA1C 6.8 (H) 01/12/2017     Assessment and Plan:

## 2017-10-01 NOTE — Assessment & Plan Note (Signed)
Continue HCTZ Reinforced DASH diet and exercise for weight loss

## 2017-10-01 NOTE — Assessment & Plan Note (Signed)
Resolved with uterine ablation Will check CBC yearly

## 2017-10-01 NOTE — Assessment & Plan Note (Signed)
Continue Albuterol as needed 

## 2017-10-12 ENCOUNTER — Other Ambulatory Visit: Payer: Self-pay | Admitting: Internal Medicine

## 2017-10-12 DIAGNOSIS — Z1231 Encounter for screening mammogram for malignant neoplasm of breast: Secondary | ICD-10-CM

## 2017-10-27 ENCOUNTER — Ambulatory Visit
Admission: RE | Admit: 2017-10-27 | Discharge: 2017-10-27 | Disposition: A | Discharge status: Home / Self Care | Payer: BLUE CROSS/BLUE SHIELD | Source: Ambulatory Visit | Attending: Internal Medicine | Admitting: Internal Medicine

## 2017-10-27 DIAGNOSIS — Z1231 Encounter for screening mammogram for malignant neoplasm of breast: Secondary | ICD-10-CM | POA: Insufficient documentation

## 2018-01-21 ENCOUNTER — Other Ambulatory Visit: Payer: Self-pay | Admitting: Family Medicine

## 2018-01-26 ENCOUNTER — Other Ambulatory Visit: Payer: Self-pay | Admitting: Family Medicine

## 2018-02-12 ENCOUNTER — Ambulatory Visit (INDEPENDENT_AMBULATORY_CARE_PROVIDER_SITE_OTHER)
Admission: RE | Admit: 2018-02-12 | Discharge: 2018-02-12 | Disposition: A | Discharge status: Home / Self Care | Payer: BLUE CROSS/BLUE SHIELD | Source: Ambulatory Visit | Attending: Internal Medicine | Admitting: Internal Medicine

## 2018-02-12 ENCOUNTER — Encounter: Payer: Self-pay | Admitting: Internal Medicine

## 2018-02-12 ENCOUNTER — Ambulatory Visit (INDEPENDENT_AMBULATORY_CARE_PROVIDER_SITE_OTHER): Payer: BLUE CROSS/BLUE SHIELD | Admitting: Internal Medicine

## 2018-02-12 VITALS — BP 126/82 | HR 75 | Temp 98.0°F | Ht 62.0 in | Wt 210.0 lb

## 2018-02-12 DIAGNOSIS — M25552 Pain in left hip: Secondary | ICD-10-CM | POA: Diagnosis not present

## 2018-02-12 DIAGNOSIS — M25551 Pain in right hip: Secondary | ICD-10-CM | POA: Diagnosis not present

## 2018-02-12 DIAGNOSIS — M25571 Pain in right ankle and joints of right foot: Secondary | ICD-10-CM

## 2018-02-12 DIAGNOSIS — E119 Type 2 diabetes mellitus without complications: Secondary | ICD-10-CM

## 2018-02-12 DIAGNOSIS — M25561 Pain in right knee: Secondary | ICD-10-CM

## 2018-02-12 DIAGNOSIS — M79604 Pain in right leg: Secondary | ICD-10-CM | POA: Diagnosis not present

## 2018-02-12 DIAGNOSIS — G8929 Other chronic pain: Secondary | ICD-10-CM

## 2018-02-12 DIAGNOSIS — M170 Bilateral primary osteoarthritis of knee: Secondary | ICD-10-CM | POA: Diagnosis not present

## 2018-02-12 DIAGNOSIS — M25562 Pain in left knee: Secondary | ICD-10-CM

## 2018-02-12 DIAGNOSIS — M25572 Pain in left ankle and joints of left foot: Secondary | ICD-10-CM

## 2018-02-12 DIAGNOSIS — Z0000 Encounter for general adult medical examination without abnormal findings: Secondary | ICD-10-CM

## 2018-02-12 DIAGNOSIS — M19072 Primary osteoarthritis, left ankle and foot: Secondary | ICD-10-CM | POA: Diagnosis not present

## 2018-02-12 MED ORDER — DICLOFENAC SODIUM 75 MG PO TBEC: 75.0000 mg | DELAYED_RELEASE_TABLET | Freq: Two times a day (BID) | ORAL | 0 refills | Status: DC

## 2018-02-12 NOTE — Patient Instructions (Signed)

## 2018-02-12 NOTE — Progress Notes (Signed)
Subjective:    Patient ID: Sheri Hood, female    DOB: 1965/12/17, 52 y.o.   MRN: [id number]  HPI  Pt presents to the clinic today for her annual exam.   Flu: 12/2017 Tetanus: 01/2014 Pneumovax: 01/2017 Pap Smear: 01/2017 Mammogram: 10/2017 Colon Screening: never Vision Screening: 2018, [place] Dentist: biannually  Diet: She does eat meat. She consumes fruits and veggies daily. She does eat some fried foods. She drinks mostly coffee, lemon water, some soda. Exercise: None  Review of Systems      Past Medical History:  Diagnosis Date  . Anemia    s/p ablation for heavy bleeding   . Arthritis    arhtritis middle back, scolosis  . Asthma   . Headache(784.0)    h/x migraines  . Heart murmur    recent diagnosed- / ehho 12/30/10 report on chart  . Hiatal hernia   . Hypertension   . Recurrent upper respiratory infection (URI)    occ sore throat and slightly stuffy- no fever or cough  . Sleep apnea   . Sleep apnea     Current Outpatient Medications  Medication Sig Dispense Refill  . BAYER MICROLET LANCETS lancets Use as instructed to test blood sugar once daily. E11.9 100 each 12  . citalopram (CELEXA) 40 MG tablet TAKE 1 TABLET BY MOUTH ONCE DAILY 90 tablet 3  . glucose blood (BAYER CONTOUR NEXT TEST) test strip Use as instructed to test blood sugar once daily. E11.9 100 each 12  . hydrochlorothiazide (HYDRODIURIL) 25 MG tablet TAKE 1 TABLET BY MOUTH IN THE MORNING 90 tablet 3  . ibuprofen (ADVIL,MOTRIN) 200 MG tablet Take 200 mg by mouth every 6 (six) hours as needed. For pain    . metFORMIN (GLUCOPHAGE) 500 MG tablet TAKE 1 TABLET BY MOUTH TWICE DAILY WITH MEALS 180 tablet 0  . omeprazole (PRILOSEC) 20 MG capsule Take 20 mg by mouth daily.    . vitamin E 400 UNIT capsule Take 800 Units by mouth at bedtime.      No current facility-administered medications for this visit.     Allergies  Allergen Reactions  . Benadryl [Diphenhydramine Hcl] Other (See  Comments)    Keeps awake, jittery  . Codeine Other (See Comments)    Keeps awake  . Dilaudid [Hydromorphone Hcl] Itching and Nausea Only  . Morphine And Related Itching and Nausea Only    Family History  Problem Relation Age of Onset  . Diabetes Father   . Breast cancer Neg Hx     Social History   Socioeconomic History  . Marital status: Married    Spouse name: Not on file  . Number of children: Not on file  . Years of education: Not on file  . Highest education level: Not on file  Occupational History  . Not on file  Social Needs  . Financial resource strain: Not on file  . Food insecurity:    Worry: Not on file    Inability: Not on file  . Transportation needs:    Medical: Not on file    Non-medical: Not on file  Tobacco Use  . Smoking status: Former Smoker    Packs/day: 0.50    Years: 4.00    Pack years: 2.00    Types: Cigarettes    Last attempt to quit: 01/02/1988    Years since quitting: 30.1  . Smokeless tobacco: Never Used  Substance and Sexual Activity  . Alcohol use: Yes    Alcohol/week:  0.0 standard drinks  . Drug use: No  . Sexual activity: Never  Lifestyle  . Physical activity:    Days per week: Not on file    Minutes per session: Not on file  . Stress: Not on file  Relationships  . Social connections:    Talks on phone: Not on file    Gets together: Not on file    Attends religious service: Not on file    Active member of club or organization: Not on file    Attends meetings of clubs or organizations: Not on file    Relationship status: Not on file  . Intimate partner violence:    Fear of current or ex partner: Not on file    Emotionally abused: Not on file    Physically abused: Not on file    Forced sexual activity: Not on file  Other Topics Concern  . Not on file  Social History Narrative  . Not on file     Constitutional: Denies fever, malaise, fatigue, headache or abrupt weight changes.  HEENT: Denies eye pain, eye redness, ear  pain, ringing in the ears, wax buildup, runny nose, nasal congestion, bloody nose, or sore throat. Respiratory: Denies difficulty breathing, shortness of breath, cough or sputum production.   Cardiovascular: Denies chest pain, chest tightness, palpitations or swelling in the hands or feet.  Gastrointestinal: Denies abdominal pain, bloating, constipation, diarrhea or blood in the stool.  GU: Denies urgency, frequency, pain with urination, burning sensation, blood in urine, odor or discharge. Musculoskeletal: Pt reports pain in hips, knees and ankles. Denies decrease in range of motion, difficulty with gait, muscle pain or joint swelling.  Skin: Denies redness, rashes, lesions or ulcercations.  Neurological: Denies dizziness, difficulty with memory, difficulty with speech or problems with balance and coordination.  Psych: Denies anxiety, depression, SI/HI.  No other specific complaints in a complete review of systems (except as listed in HPI above).  Objective:   Physical Exam  BP 126/82   Pulse 75   Temp 98 F (36.7 C) (Oral)   Ht 5\' 2"  (1.575 m)   Wt 210 lb (95.3 kg)   SpO2 97%   BMI 38.41 kg/m  Wt Readings from Last 3 Encounters:  02/12/18 210 lb (95.3 kg)  10/01/17 210 lb (95.3 kg)  04/17/17 210 lb (95.3 kg)    General: Appears her stated age, obese in NAD. Skin: Warm, dry and intact.  HEENT: Head: normal shape and size; Eyes: sclera white, no icterus, conjunctiva pink, PERRLA and EOMs intact; Ears: Tm's gray and intact, normal light reflex;Throat/Mouth: Teeth present, mucosa pink and moist, no exudate, lesions or ulcerations noted.  Neck:  Neck supple, trachea midline. No masses, lumps or thyromegaly present.  Cardiovascular: Normal rate and rhythm. S1,S2 noted.  No murmur, rubs or gallops noted. No JVD or BLE edema. No carotid bruits noted. Pulmonary/Chest: Normal effort and positive vesicular breath sounds. No respiratory distress. No wheezes, rales or ronchi noted.  Abdomen:  Soft and nontender. Normal bowel sounds. No distention or masses noted. Liver, spleen and kidneys non palpable. Musculoskeletal: Normal range of motion of hips, knees and ankles. No joint swelling noted. Strength 5/5 BUE/BLE. No difficulty with gait.  Neurological: Alert and oriented. Cranial nerves II-XII grossly intact. Coordination normal.  Psychiatric: Mood and affect normal. Behavior is normal. Judgment and thought content normal.    BMET    Component Value Date/Time   NA 141 01/12/2017 1324   K 3.6 01/12/2017 1324  CL 99 01/12/2017 1324   CO2 23 01/12/2017 1324   GLUCOSE 141 (H) 01/12/2017 1324   BUN 14 01/12/2017 1324   CREATININE 0.72 01/12/2017 1324   CALCIUM 9.6 01/12/2017 1324   GFRNONAA 99 01/12/2017 1324   GFRAA 114 01/12/2017 1324    Lipid Panel     Component Value Date/Time   CHOL 196 06/13/2016 0817   TRIG 114 06/13/2016 0817   HDL 45 06/13/2016 0817   CHOLHDL 5.3 (H) 03/05/2016 1313   LDLCALC 128 (H) 06/13/2016 0817    CBC    Component Value Date/Time   WBC 4.3 01/12/2017 1324   RBC 5.00 01/12/2017 1324   HGB 14.2 01/12/2017 1324   HCT 42.0 01/12/2017 1324   PLT 168 01/12/2017 1324   MCV 84 01/12/2017 1324   MCH 28.4 01/12/2017 1324   MCHC 33.8 01/12/2017 1324   RDW 13.6 01/12/2017 1324   LYMPHSABS 1.7 01/12/2017 1324   EOSABS 0.1 01/12/2017 1324   BASOSABS 0.0 01/12/2017 1324    Hgb A1C Lab Results  Component Value Date   HGBA1C 6.8 (H) 01/12/2017            Assessment & Plan:   Preventative Health Maintenance:  Flu, tetanus and pneumovax UTD. Pap and mammogram UTD She declines colon screening but is agreeable to Cologuard- will see if covered by her insurance Encouraged her to consume a balanced diet and exercise regimen.  Advised her to see an eye doctor and dentist annually Will check CBC, CMET, Lipid, A1C, Vit D and urine microalbumin.  Chronic Hip Pain, Chronic Bilateral Knee Pain, Chronic Bilateral Ankle Pain:  Will  obtain xray of bilateral hips, knees and ankles Discussed how weight loss could help improve reflux Will start Diclofenac 75 mb BID- avoid all other NSAID's Can take Tylenol if needed for pain  RTC in 3 months, follow up DM 2 Webb Silversmith, NP

## 2018-02-13 ENCOUNTER — Other Ambulatory Visit: Payer: Self-pay | Admitting: Internal Medicine

## 2018-02-13 LAB — CBC
Hematocrit: 43 % (ref 34.0–46.6)
Hemoglobin: 14.3 g/dL (ref 11.1–15.9)
MCH: 28.3 pg (ref 26.6–33.0)
MCHC: 33.3 g/dL (ref 31.5–35.7)
MCV: 85 fL (ref 79–97)
Platelets: 189 10*3/uL (ref 150–450)
RBC: 5.05 x10E6/uL (ref 3.77–5.28)
RDW: 13.1 % (ref 12.3–15.4)
WBC: 5.4 10*3/uL (ref 3.4–10.8)

## 2018-02-13 LAB — MICROALBUMIN / CREATININE URINE RATIO
Creatinine, Urine: 96 mg/dL
MICROALB/CREAT RATIO: 14.1 mg/g{creat} (ref 0.0–30.0)
MICROALBUM., U, RANDOM: 13.5 ug/mL

## 2018-02-13 LAB — COMPREHENSIVE METABOLIC PANEL
ALBUMIN: 4.8 g/dL (ref 3.5–5.5)
ALT: 71 IU/L — ABNORMAL HIGH (ref 0–32)
AST: 51 IU/L — ABNORMAL HIGH (ref 0–40)
Albumin/Globulin Ratio: 2 (ref 1.2–2.2)
Alkaline Phosphatase: 77 IU/L (ref 39–117)
BILIRUBIN TOTAL: 0.3 mg/dL (ref 0.0–1.2)
BUN / CREAT RATIO: 13 (ref 9–23)
BUN: 11 mg/dL (ref 6–24)
CHLORIDE: 98 mmol/L (ref 96–106)
CO2: 24 mmol/L (ref 20–29)
Calcium: 10.1 mg/dL (ref 8.7–10.2)
Creatinine, Ser: 0.82 mg/dL (ref 0.57–1.00)
GFR calc non Af Amer: 84 mL/min/{1.73_m2} (ref >59)
GFR, EST AFRICAN AMERICAN: 96 mL/min/{1.73_m2} (ref >59)
Globulin, Total: 2.4 g/dL (ref 1.5–4.5)
Glucose: 163 mg/dL — ABNORMAL HIGH (ref 65–99)
Potassium: 3.4 mmol/L — ABNORMAL LOW (ref 3.5–5.2)
Sodium: 139 mmol/L (ref 134–144)
TOTAL PROTEIN: 7.2 g/dL (ref 6.0–8.5)

## 2018-02-13 LAB — HEMOGLOBIN A1C
ESTIMATED AVERAGE GLUCOSE: 163 mg/dL
HEMOGLOBIN A1C: 7.3 % — AB (ref 4.8–5.6)

## 2018-02-13 LAB — LIPID PANEL
Chol/HDL Ratio: 5.9 ratio — ABNORMAL HIGH (ref 0.0–4.4)
Cholesterol, Total: 261 mg/dL — ABNORMAL HIGH (ref 100–199)
HDL: 44 mg/dL (ref >39)
LDL Calculated: 144 mg/dL — ABNORMAL HIGH (ref 0–99)
Triglycerides: 363 mg/dL — ABNORMAL HIGH (ref 0–149)
VLDL Cholesterol Cal: 73 mg/dL — ABNORMAL HIGH (ref 5–40)

## 2018-02-13 LAB — VITAMIN D 25 HYDROXY (VIT D DEFICIENCY, FRACTURES): Vit D, 25-Hydroxy: 12.4 ng/mL — ABNORMAL LOW (ref 30.0–100.0)

## 2018-02-13 MED ORDER — VITAMIN D (ERGOCALCIFEROL) 1.25 MG (50000 UNIT) PO CAPS: 50000.0000 [IU] | ORAL_CAPSULE | ORAL | 0 refills | Status: DC

## 2018-02-13 MED ORDER — METFORMIN HCL 1000 MG PO TABS: 1000.0000 mg | ORAL_TABLET | Freq: Two times a day (BID) | ORAL | 2 refills | Status: DC

## 2018-02-15 ENCOUNTER — Encounter: Payer: Self-pay | Admitting: Internal Medicine

## 2018-02-16 ENCOUNTER — Other Ambulatory Visit: Payer: Self-pay | Admitting: Internal Medicine

## 2018-02-16 MED ORDER — ROSUVASTATIN CALCIUM 10 MG PO TABS: 10.0000 mg | ORAL_TABLET | Freq: Every day | ORAL | 2 refills | Status: DC

## 2018-02-20 DIAGNOSIS — J019 Acute sinusitis, unspecified: Secondary | ICD-10-CM | POA: Diagnosis not present

## 2018-02-20 DIAGNOSIS — H9202 Otalgia, left ear: Secondary | ICD-10-CM | POA: Diagnosis not present

## 2018-02-20 DIAGNOSIS — R6889 Other general symptoms and signs: Secondary | ICD-10-CM | POA: Diagnosis not present

## 2018-02-20 DIAGNOSIS — R05 Cough: Secondary | ICD-10-CM | POA: Diagnosis not present

## 2018-03-12 ENCOUNTER — Other Ambulatory Visit: Payer: Self-pay | Admitting: Internal Medicine

## 2018-03-15 NOTE — Telephone Encounter (Signed)
Last filled 02/12/18... please advise if okay to refill

## 2018-03-22 ENCOUNTER — Telehealth: Payer: Self-pay | Admitting: *Deleted

## 2018-03-22 MED ORDER — GLIPIZIDE 5 MG PO TABS: 5.0000 mg | ORAL_TABLET | Freq: Two times a day (BID) | ORAL | 2 refills | Status: DC

## 2018-03-22 NOTE — Telephone Encounter (Signed)
Spoke to pt who states she was seen in 01/2018 and prescribed Metformin. She states that since starting the medication, she has experienced Diarrhea and nausea and she has "gone through a box of dramamine." She states that she continued to take the medication because she read online it could take her a while for her body to get adjusted, but the s/s are becoming unbearable, and she is also developing joint pain. Pt is wanting to know if there is an alternate medication she can be prescribed. If so, Rx can be sent to [place] pls advise

## 2018-03-22 NOTE — Telephone Encounter (Signed)
D/c Metformin. RX sent in for Glipizide 5 mg BID.

## 2018-03-22 NOTE — Addendum Note (Signed)
Addended by: Jearld Fenton on: 03/22/2018 11:19 AM   Modules accepted: Orders

## 2018-03-23 NOTE — Telephone Encounter (Signed)
Spoke to pt who inquired about new Rx; advised per [facility]

## 2018-04-20 ENCOUNTER — Encounter: Payer: Self-pay | Admitting: Gastroenterology

## 2018-05-04 ENCOUNTER — Ambulatory Visit (AMBULATORY_SURGERY_CENTER): Payer: Self-pay

## 2018-05-04 VITALS — Ht 62.0 in | Wt 212.6 lb

## 2018-05-04 DIAGNOSIS — Z1211 Encounter for screening for malignant neoplasm of colon: Secondary | ICD-10-CM

## 2018-05-04 MED ORDER — PEG-KCL-NACL-NASULF-NA ASC-C 140 G PO SOLR: 1.0000 | Freq: Once | ORAL | 0 refills | Status: AC

## 2018-05-04 NOTE — Progress Notes (Signed)
Denies allergies to eggs or soy products.   Patient states that she had a complication with anesthesia when she had general anesthesia and sedation when she broke both of her ankles. Patient states she ended up in the hospital for a week when she was supposed to be in over night.   Patient has had other procedures and surgeries and has not had a problem. Sheri Hood also spoke with the patient and thought that the patient would be fine for [facility] and that the complications had to do with the amount and the combination of medications that were required for the surgery and injury to both ankles.    Denies use of weight loss medication. Denies use of O2 at home.   Emmi instructions declined.    A pay no more than 50.00 coupon for Plenvu was given to the patient.

## 2018-05-05 ENCOUNTER — Encounter: Payer: Self-pay | Admitting: Gastroenterology

## 2018-05-18 ENCOUNTER — Telehealth: Payer: Self-pay | Admitting: *Deleted

## 2018-05-18 NOTE — Telephone Encounter (Signed)
Covid-19 travel screening questions  Have you traveled in the last 14 days? No If yes where?  Do you now or have you had a fever in the last 14 days? No  Do you have any respiratory symptoms of shortness of breath or cough now or in the last 14 days? No  Do you have any family members or close contacts with diagnosed or suspected Covid-19? No       

## 2018-05-19 ENCOUNTER — Ambulatory Visit (AMBULATORY_SURGERY_CENTER): Payer: BLUE CROSS/BLUE SHIELD | Admitting: Gastroenterology

## 2018-05-19 ENCOUNTER — Encounter: Payer: Self-pay | Admitting: Gastroenterology

## 2018-05-19 ENCOUNTER — Other Ambulatory Visit: Payer: Self-pay

## 2018-05-19 VITALS — BP 129/82 | HR 60 | Temp 98.9°F | Resp 14 | Ht 62.0 in | Wt 212.0 lb

## 2018-05-19 DIAGNOSIS — Z1211 Encounter for screening for malignant neoplasm of colon: Secondary | ICD-10-CM

## 2018-05-19 MED ORDER — SODIUM CHLORIDE 0.9 % IV SOLN: 500.0000 mL | Freq: Once | INTRAVENOUS | Status: DC

## 2018-05-19 NOTE — Patient Instructions (Signed)
YOU HAD AN ENDOSCOPIC PROCEDURE TODAY AT THE [facility]:   Refer to the procedure report that was given to you for any specific questions about what was found during the examination.  If the procedure report does not answer your questions, please call your gastroenterologist to clarify.  If you requested that your care partner not be given the details of your procedure findings, then the procedure report has been included in a sealed envelope for you to review at your convenience later.  YOU SHOULD EXPECT: Some feelings of bloating in the abdomen. Passage of more gas than usual.  Walking can help get rid of the air that was put into your GI tract during the procedure and reduce the bloating. If you had a lower endoscopy (such as a colonoscopy or flexible sigmoidoscopy) you may notice spotting of blood in your stool or on the toilet paper. If you underwent a bowel prep for your procedure, you may not have a normal bowel movement for a few days.  Please Note:  You might notice some irritation and congestion in your nose or some drainage.  This is from the oxygen used during your procedure.  There is no need for concern and it should clear up in a day or so.  SYMPTOMS TO REPORT IMMEDIATELY:   Following lower endoscopy (colonoscopy or flexible sigmoidoscopy):  Excessive amounts of blood in the stool  Significant tenderness or worsening of abdominal pains  Swelling of the abdomen that is new, acute  Fever of 100F or higher  For urgent or emergent issues, a gastroenterologist can be reached at any hour by calling [phone].   DIET:  We do recommend a small meal at first, but then you may proceed to your regular diet.  Drink plenty of fluids but you should avoid alcoholic beverages for 24 hours.  ACTIVITY:  You should plan to take it easy for the rest of today and you should NOT DRIVE or use heavy machinery until tomorrow (because of the sedation medicines used during the test).     FOLLOW UP: Our staff will call the number listed on your records the next business day following your procedure to check on you and address any questions or concerns that you may have regarding the information given to you following your procedure. If we do not reach you, we will leave a message.  However, if you are feeling well and you are not experiencing any problems, there is no need to return our call.  We will assume that you have returned to your regular daily activities without incident.  If any biopsies were taken you will be contacted by phone or by letter within the next 1-3 weeks.  Please call us at [phone] if you have not heard about the biopsies in 3 weeks.    SIGNATURES/CONFIDENTIALITY: You and/or your care partner have signed paperwork which will be entered into your electronic medical record.  These signatures attest to the fact that that the information above on your After Visit Summary has been reviewed and is understood.  Full responsibility of the confidentiality of this discharge information lies with you and/or your care-partner. 

## 2018-05-19 NOTE — Progress Notes (Signed)
PT taken to PACU. Monitors in place. VSS. Report given to RN. 

## 2018-05-19 NOTE — Op Note (Addendum)
[facility] Patient Name: Sheri Hood Procedure Date: 05/19/2018 8:05 AM MRN: [id number] Endoscopist: [place]. Loletha Carrow , MD Age: 53 Referring MD:  Date of Birth: 09-12-1965 Gender: Female Account #: [id number] Procedure:                Colonoscopy Indications:              Screening for colorectal malignant neoplasm, This                            is the patient's first screening colonoscopy Medicines:                Monitored Anesthesia Care Procedure:                Pre-Anesthesia Assessment:                           - Prior to the procedure, a History and Physical                            was performed, and patient medications and                            allergies were reviewed. The patient's tolerance of                            previous anesthesia was also reviewed. The risks                            and benefits of the procedure and the sedation                            options and risks were discussed with the patient.                            All questions were answered, and informed consent                            was obtained. Prior Anticoagulants: The patient has                            taken no previous anticoagulant or antiplatelet                            agents. ASA Grade Assessment: II - A patient with                            mild systemic disease. After reviewing the risks                            and benefits, the patient was deemed in                            satisfactory condition to undergo the procedure.  After obtaining informed consent, the colonoscope                            was passed under direct vision. Throughout the                            procedure, the patient's blood pressure, pulse, and                            oxygen saturations were monitored continuously. The                            Colonoscope was introduced through the anus and                            advanced to the  the cecum, identified by                            appendiceal orifice and ileocecal valve. The                            colonoscopy was performed without difficulty. The                            patient tolerated the procedure well. The quality                            of the bowel preparation was good after lavage. The                            ileocecal valve, appendiceal orifice, and rectum                            were photographed. Scope In: 8:11:16 AM Scope Out: 8:26:51 AM Scope Withdrawal Time: 0 hours 10 minutes 18 seconds  Total Procedure Duration: 0 hours 15 minutes 35 seconds  Findings:                 The perianal and digital rectal examinations were                            normal.                           The entire examined colon appeared normal on direct                            and retroflexion views. Complications:            No immediate complications. Estimated Blood Loss:     Estimated blood loss: none. Impression:               - The entire examined colon is normal on direct and                            retroflexion views.                           -  No specimens collected. Recommendation:           - Patient has a contact number available for                            emergencies. The signs and symptoms of potential                            delayed complications were discussed with the                            patient. Return to normal activities tomorrow.                            Written discharge instructions were provided to the                            patient.                           - Resume previous diet.                           - Continue present medications.                           - Repeat colonoscopy in 10 years for screening                            purposes. Caileb Rhue L. Loletha Carrow, MD 05/19/2018 8:31:50 AM This report has been signed electronically.

## 2018-05-19 NOTE — Progress Notes (Signed)
Pt's states no medical or surgical changes since previsit or office visit. 

## 2018-05-20 ENCOUNTER — Telehealth: Payer: Self-pay

## 2018-05-20 NOTE — Telephone Encounter (Signed)
  Follow up Call-  Call back number 05/19/2018  Post procedure Call Back phone  # [phone]  Permission to leave phone message Yes  Some recent data might be hidden     Patient questions:  Do you have a fever, pain , or abdominal swelling? No. Pain Score  0 *  Have you tolerated food without any problems? Yes.    Have you been able to return to your normal activities? Yes.    Do you have any questions about your discharge instructions: Diet   No. Medications  No. Follow up visit  No.  Do you have questions or concerns about your Care? No.  Actions: * If pain score is 4 or above: No action needed, pain <4.

## 2018-05-31 ENCOUNTER — Other Ambulatory Visit: Payer: Self-pay | Admitting: Internal Medicine

## 2018-06-18 ENCOUNTER — Other Ambulatory Visit: Payer: Self-pay | Admitting: Internal Medicine

## 2018-06-21 NOTE — Telephone Encounter (Signed)
Patient left a voicemail stating that she needs a refill on Glipizide. Patient stated that pharmacy sent the script over and has not heard anything back. Patient wants a call back.

## 2018-06-21 NOTE — Telephone Encounter (Signed)
1 mth Rx sent, I will need to schedule f/u as pt is overdue for diabetes f/u---via [email or web address]

## 2018-06-22 ENCOUNTER — Other Ambulatory Visit: Payer: Self-pay | Admitting: Internal Medicine

## 2018-06-22 MED ORDER — GLIPIZIDE 5 MG PO TABS: 5.0000 mg | ORAL_TABLET | Freq: Two times a day (BID) | ORAL | 0 refills | Status: DC

## 2018-07-15 ENCOUNTER — Encounter: Payer: Self-pay | Admitting: Internal Medicine

## 2018-07-15 LAB — HM DIABETES EYE EXAM

## 2018-07-21 ENCOUNTER — Encounter: Payer: Self-pay | Admitting: Internal Medicine

## 2018-07-21 ENCOUNTER — Other Ambulatory Visit: Payer: Self-pay | Admitting: Internal Medicine

## 2018-07-22 ENCOUNTER — Encounter: Payer: Self-pay | Admitting: Internal Medicine

## 2018-07-22 MED ORDER — GLIPIZIDE 5 MG PO TABS: 5.0000 mg | ORAL_TABLET | Freq: Two times a day (BID) | ORAL | 0 refills | Status: DC

## 2018-07-23 ENCOUNTER — Encounter: Payer: Self-pay | Admitting: Internal Medicine

## 2018-07-23 ENCOUNTER — Ambulatory Visit (INDEPENDENT_AMBULATORY_CARE_PROVIDER_SITE_OTHER): Payer: BLUE CROSS/BLUE SHIELD | Admitting: Internal Medicine

## 2018-07-23 DIAGNOSIS — J4520 Mild intermittent asthma, uncomplicated: Secondary | ICD-10-CM

## 2018-07-23 DIAGNOSIS — F419 Anxiety disorder, unspecified: Secondary | ICD-10-CM

## 2018-07-23 DIAGNOSIS — E119 Type 2 diabetes mellitus without complications: Secondary | ICD-10-CM

## 2018-07-23 DIAGNOSIS — G4733 Obstructive sleep apnea (adult) (pediatric): Secondary | ICD-10-CM

## 2018-07-23 DIAGNOSIS — K219 Gastro-esophageal reflux disease without esophagitis: Secondary | ICD-10-CM | POA: Diagnosis not present

## 2018-07-23 DIAGNOSIS — M1990 Unspecified osteoarthritis, unspecified site: Secondary | ICD-10-CM

## 2018-07-23 DIAGNOSIS — E1169 Type 2 diabetes mellitus with other specified complication: Secondary | ICD-10-CM | POA: Insufficient documentation

## 2018-07-23 DIAGNOSIS — F32A Depression, unspecified: Secondary | ICD-10-CM

## 2018-07-23 DIAGNOSIS — I10 Essential (primary) hypertension: Secondary | ICD-10-CM

## 2018-07-23 DIAGNOSIS — E559 Vitamin D deficiency, unspecified: Secondary | ICD-10-CM

## 2018-07-23 DIAGNOSIS — E7800 Pure hypercholesterolemia, unspecified: Secondary | ICD-10-CM

## 2018-07-23 DIAGNOSIS — F329 Major depressive disorder, single episode, unspecified: Secondary | ICD-10-CM

## 2018-07-23 NOTE — Assessment & Plan Note (Signed)
Will have her set up lab only appt for A1C and microalbumin Encouraged her to consume a low carb diet and exercise for weight loss Continue Glipizide Encouraged routine foot exams Encouraged yearly eye exams Flu and pneumovax UTD

## 2018-07-23 NOTE — Assessment & Plan Note (Signed)
Will have her set up lab only appt for Vit D

## 2018-07-23 NOTE — Assessment & Plan Note (Signed)
Discussed avoiding foods that trigger her reflux Discussed how weight loss could improve reflux Continue Omeprazole for now Will have her set up lab only appt for CMET and CBC

## 2018-07-23 NOTE — Patient Instructions (Signed)
Carbohydrate Counting for Diabetes Mellitus, Adult  Carbohydrate counting is a method of keeping track of how many carbohydrates you eat. Eating carbohydrates naturally increases the amount of sugar (glucose) in the blood. Counting how many carbohydrates you eat helps keep your blood glucose within normal limits, which helps you manage your diabetes (diabetes mellitus). It is important to know how many carbohydrates you can safely have in each meal. This is different for every person. A diet and nutrition specialist (registered dietitian) can help you make a meal plan and calculate how many carbohydrates you should have at each meal and snack. Carbohydrates are found in the following foods:  Grains, such as breads and cereals.  Dried beans and soy products.  Starchy vegetables, such as potatoes, peas, and corn.  Fruit and fruit juices.  Milk and yogurt.  Sweets and snack foods, such as cake, cookies, candy, chips, and soft drinks. How do I count carbohydrates? There are two ways to count carbohydrates in food. You can use either of the methods or a combination of both. Reading "Nutrition Facts" on packaged food The "Nutrition Facts" list is included on the labels of almost all packaged foods and beverages in the [place] It includes:  The serving size.  Information about nutrients in each serving, including the grams (g) of carbohydrate per serving. To use the "Nutrition Facts":  Decide how many servings you will have.  Multiply the number of servings by the number of carbohydrates per serving.  The resulting number is the total amount of carbohydrates that you will be having. Learning standard serving sizes of other foods When you eat carbohydrate foods that are not packaged or do not include "Nutrition Facts" on the label, you need to measure the servings in order to count the amount of carbohydrates:  Measure the foods that you will eat with a food scale or measuring cup, if needed.   Decide how many standard-size servings you will eat.  Multiply the number of servings by 15. Most carbohydrate-rich foods have about 15 g of carbohydrates per serving. ? For example, if you eat 8 oz (170 g) of strawberries, you will have eaten 2 servings and 30 g of carbohydrates (2 servings x 15 g = 30 g).  For foods that have more than one food mixed, such as soups and casseroles, you must count the carbohydrates in each food that is included. The following list contains standard serving sizes of common carbohydrate-rich foods. Each of these servings has about 15 g of carbohydrates:   hamburger bun or  English muffin.   oz (15 mL) syrup.   oz (14 g) jelly.  1 slice of bread.  1 six-inch tortilla.  3 oz (85 g) cooked rice or pasta.  4 oz (113 g) cooked dried beans.  4 oz (113 g) starchy vegetable, such as peas, corn, or potatoes.  4 oz (113 g) hot cereal.  4 oz (113 g) mashed potatoes or  of a large baked potato.  4 oz (113 g) canned or frozen fruit.  4 oz (120 mL) fruit juice.  4-6 crackers.  6 chicken nuggets.  6 oz (170 g) unsweetened dry cereal.  6 oz (170 g) plain fat-free yogurt or yogurt sweetened with artificial sweeteners.  8 oz (240 mL) milk.  8 oz (170 g) fresh fruit or one small piece of fruit.  24 oz (680 g) popped popcorn. Example of carbohydrate counting Sample meal  3 oz (85 g) chicken breast.  6 oz (170 g)   brown rice.  4 oz (113 g) corn.  8 oz (240 mL) milk.  8 oz (170 g) strawberries with sugar-free whipped topping. Carbohydrate calculation 1. Identify the foods that contain carbohydrates: ? Rice. ? Corn. ? Milk. ? Strawberries. 2. Calculate how many servings you have of each food: ? 2 servings rice. ? 1 serving corn. ? 1 serving milk. ? 1 serving strawberries. 3. Multiply each number of servings by 15 g: ? 2 servings rice x 15 g = 30 g. ? 1 serving corn x 15 g = 15 g. ? 1 serving milk x 15 g = 15 g. ? 1 serving  strawberries x 15 g = 15 g. 4. Add together all of the amounts to find the total grams of carbohydrates eaten: ? 30 g + 15 g + 15 g + 15 g = 75 g of carbohydrates total. Summary  Carbohydrate counting is a method of keeping track of how many carbohydrates you eat.  Eating carbohydrates naturally increases the amount of sugar (glucose) in the blood.  Counting how many carbohydrates you eat helps keep your blood glucose within normal limits, which helps you manage your diabetes.  A diet and nutrition specialist (registered dietitian) can help you make a meal plan and calculate how many carbohydrates you should have at each meal and snack. This information is not intended to replace advice given to you by your health care provider. Make sure you discuss any questions you have with your health care provider. Document Released: 02/17/2005 Document Revised: 08/27/2016 Document Reviewed: 08/01/2015 Elsevier Interactive Patient Education  2019 [employer]  

## 2018-07-23 NOTE — Assessment & Plan Note (Signed)
Encouraged her to consume a low fat diet Will have her schedule lab only appt for CMET and Lipid profile Continue Rosuvastatin for now

## 2018-07-23 NOTE — Progress Notes (Signed)
Virtual Visit via Video Note  I connected with Sheri Hood on 07/23/18 at  3:00 PM EDT by a video enabled telemedicine application and verified that I am speaking with the correct person using two identifiers.  Location: Patient: Home Provider: Office   I discussed the limitations of evaluation and management by telemedicine and the availability of in person appointments. The patient expressed understanding and agreed to proceed.  History of Present Illness:  Pt due for follow up of chronic conditions.  OSA: She averages 6-7 hours of sleep per night with the use of her CPAP. There is no sleep study on file, but reports she had a sleep study more than 5 years ago.  HTN: She does not monitor her BP at home. She is taking HCTZ as prescribed. ECG from 11/2010 reviewed.  HLD: Her last LDL was 144, triglycerides 263, 01/2018. She was started on Rosuvastatin. She has been taking the medication as prescribed. She denies myalgias. She has not been consuming a low fat diet.  GERD: She denies breakthrough on Omeprazole. There is no upper GI on file.  DM 2: Her last A1C was 7.3, 01/2018. She is taking Glipizide as prescribed. She does not check her sugars. She checks her feet routinely. Her last eye exam was about 1 month ago. Flu 12/2017. Pneumovax 01/2017.  Anxiety and Depression: Chronic but stable on Citalopram. She is not currently seeing a therapist. She denies SI/HI.  OA: Mainly in her back. She takes Voltaren as needed with good relief of symptoms.   Asthma: Allergy induced. She takes Zyrtec daily. She is not using any inhalers.   Vit D Deficiency: Her last Vit D was 12.4, 01/2018. She completed 12 weeks of Ergocalciferol. She is not taking any Vit D OTC. She is due for repeat labs today.  Past Medical History:  Diagnosis Date  . Allergy   . Anemia    s/p ablation for heavy bleeding   . Anxiety   . Arthritis    arhtritis middle back, scolosis  . Asthma   . Depression   .  Diabetes mellitus without complication ([place])   . GERD (gastroesophageal reflux disease)   . Headache(784.0)    h/x migraines  . Heart murmur    recent diagnosed- / [facility]ho 12/30/10 report on chart  . Hiatal hernia   . Hyperlipidemia   . Hypertension   . Recurrent upper respiratory infection (URI)    occ sore throat and slightly stuffy- no fever or cough  . Sleep apnea    On CPAP  . Sleep apnea     Current Outpatient Medications  Medication Sig Dispense Refill  . BAYER MICROLET LANCETS lancets Use as instructed to test blood sugar once daily. E11.9 (Patient not taking: Reported on 05/19/2018) 100 each 12  . cetirizine (ZYRTEC) 10 MG tablet Take 10 mg by mouth daily.    . citalopram (CELEXA) 40 MG tablet TAKE 1 TABLET BY MOUTH ONCE DAILY 90 tablet 3  . diclofenac (VOLTAREN) 75 MG EC tablet TAKE 1 TABLET BY MOUTH TWICE DAILY 60 tablet 4  . glipiZIDE (GLUCOTROL) 5 MG tablet Take 1 tablet (5 mg total) by mouth 2 (two) times daily before a meal. 60 tablet 0  . glucose blood (BAYER CONTOUR NEXT TEST) test strip Use as instructed to test blood sugar once daily. E11.9 (Patient not taking: Reported on 05/19/2018) 100 each 12  . hydrochlorothiazide (HYDRODIURIL) 25 MG tablet TAKE 1 TABLET BY MOUTH IN THE MORNING 90 tablet 3  .  ibuprofen (ADVIL,MOTRIN) 200 MG tablet Take 200 mg by mouth every 6 (six) hours as needed. For pain    . omeprazole (PRILOSEC) 20 MG capsule Take 20 mg by mouth daily.    . rosuvastatin (CRESTOR) 10 MG tablet Take 1 tablet by mouth once daily 90 tablet 0  . vitamin E 400 UNIT capsule Take 800 Units by mouth at bedtime.      No current facility-administered medications for this visit.     Allergies  Allergen Reactions  . Benadryl [Diphenhydramine Hcl] Other (See Comments)    Keeps awake, jittery  . Codeine Other (See Comments)    Keeps awake  . Dilaudid [Hydromorphone Hcl] Itching and Nausea Only  . Morphine And Related Itching and Nausea Only    Family History   Problem Relation Age of Onset  . Diabetes Father   . Breast cancer Neg Hx   . Colon cancer Neg Hx   . Esophageal cancer Neg Hx   . Rectal cancer Neg Hx   . Stomach cancer Neg Hx     Social History   Socioeconomic History  . Marital status: Married    Spouse name: Not on file  . Number of children: Not on file  . Years of education: Not on file  . Highest education level: Not on file  Occupational History  . Not on file  Social Needs  . Financial resource strain: Not on file  . Food insecurity:    Worry: Not on file    Inability: Not on file  . Transportation needs:    Medical: Not on file    Non-medical: Not on file  Tobacco Use  . Smoking status: Former Smoker    Packs/day: 0.50    Years: 4.00    Pack years: 2.00    Types: Cigarettes    Last attempt to quit: 01/02/1988    Years since quitting: 30.5  . Smokeless tobacco: Never Used  Substance and Sexual Activity  . Alcohol use: Yes    Alcohol/week: 0.0 standard drinks    Comment: Occasional Drink  . Drug use: No  . Sexual activity: Never  Lifestyle  . Physical activity:    Days per week: Not on file    Minutes per session: Not on file  . Stress: Not on file  Relationships  . Social connections:    Talks on phone: Not on file    Gets together: Not on file    Attends religious service: Not on file    Active member of club or organization: Not on file    Attends meetings of clubs or organizations: Not on file    Relationship status: Not on file  . Intimate partner violence:    Fear of current or ex partner: Not on file    Emotionally abused: Not on file    Physically abused: Not on file    Forced sexual activity: Not on file  Other Topics Concern  . Not on file  Social History Narrative  . Not on file     Constitutional: Denies fever, malaise, fatigue, headache or abrupt weight changes.  HEENT: Denies eye pain, eye redness, ear pain, ringing in the ears, wax buildup, runny nose, nasal congestion,  bloody nose, or sore throat. Respiratory: Denies difficulty breathing, shortness of breath, cough or sputum production.   Cardiovascular: Denies chest pain, chest tightness, palpitations or swelling in the hands or feet.  Gastrointestinal: Denies abdominal pain, bloating, constipation, diarrhea or blood in the stool.  GU: Denies urgency, frequency, pain with urination, burning sensation, blood in urine, odor or discharge. Musculoskeletal: Pt reports joint pain. Denies decrease in range of motion, difficulty with gait, muscle pain or joint swelling.  Skin: Denies redness, rashes, lesions or ulcercations.  Neurological: Denies dizziness, difficulty with memory, difficulty with speech or problems with balance and coordination.  Psych: Pt has a history of anxiety and depression. Denies SI/HI.  No other specific complaints in a complete review of systems (except as listed in HPI above).  There were no vitals taken for this visit.  Wt Readings from Last 3 Encounters:  05/19/18 212 lb (96.2 kg)  05/04/18 212 lb 9.6 oz (96.4 kg)  02/12/18 210 lb (95.3 kg)    General: Appears her stated age, obese, in NAD. Skin: Warm, dry and intact. No obvious ulcerations noted. Pulmonary/Chest: Normal effort. No respiratory distress. Musculoskeletal: No obvious joint swelling noted. Neurological: Alert and oriented.  Psychiatric: Mood and affect normal. Behavior is normal. Judgment and thought content normal.     BMET    Component Value Date/Time   NA 139 02/12/2018 1552   K 3.4 (L) 02/12/2018 1552   CL 98 02/12/2018 1552   CO2 24 02/12/2018 1552   GLUCOSE 163 (H) 02/12/2018 1552   BUN 11 02/12/2018 1552   CREATININE 0.82 02/12/2018 1552   CALCIUM 10.1 02/12/2018 1552   GFRNONAA 84 02/12/2018 1552   GFRAA 96 02/12/2018 1552    Lipid Panel     Component Value Date/Time   CHOL 261 (H) 02/12/2018 1552   TRIG 363 (H) 02/12/2018 1552   HDL 44 02/12/2018 1552   CHOLHDL 5.9 (H) 02/12/2018 1552    LDLCALC 144 (H) 02/12/2018 1552    CBC    Component Value Date/Time   WBC 5.4 02/12/2018 1552   RBC 5.05 02/12/2018 1552   HGB 14.3 02/12/2018 1552   HCT 43.0 02/12/2018 1552   PLT 189 02/12/2018 1552   MCV 85 02/12/2018 1552   MCH 28.3 02/12/2018 1552   MCHC 33.3 02/12/2018 1552   RDW 13.1 02/12/2018 1552   LYMPHSABS 1.7 01/12/2017 1324   EOSABS 0.1 01/12/2017 1324   BASOSABS 0.0 01/12/2017 1324    Hgb A1C Lab Results  Component Value Date   HGBA1C 7.3 (H) 02/12/2018        Assessment and Plan:  See problem based charting  Follow Up Instructions:    I discussed the assessment and treatment plan with the patient. The patient was provided an opportunity to ask questions and all were answered. The patient agreed with the plan and demonstrated an understanding of the instructions.   The patient was advised to call back or seek an in-person evaluation if the symptoms worsen or if the condition fails to improve as anticipated.    Webb Silversmith, NP

## 2018-07-23 NOTE — Assessment & Plan Note (Signed)
Discussed how weight loss could help improve OSA Continue CPAP for now

## 2018-07-23 NOTE — Assessment & Plan Note (Signed)
Will have her set up nurse visit for BP check Will have her set up lab visit for CMET Encouraged her to consume a DASH diet and exercise for weight loss Continue HCTZ

## 2018-07-23 NOTE — Assessment & Plan Note (Signed)
Continue Zyrtec No need for inhalers Will monitor

## 2018-07-23 NOTE — Assessment & Plan Note (Signed)
Stable on Citalopram Support offered today Will monitor

## 2018-07-23 NOTE — Assessment & Plan Note (Signed)
Encouraged regular stretching, core strengthening and exercise for weight loss Continue Voltaren prn Discussed how weight loss could help improve joint pain

## 2018-07-27 ENCOUNTER — Other Ambulatory Visit (INDEPENDENT_AMBULATORY_CARE_PROVIDER_SITE_OTHER): Payer: BLUE CROSS/BLUE SHIELD

## 2018-07-27 ENCOUNTER — Ambulatory Visit: Payer: BLUE CROSS/BLUE SHIELD

## 2018-07-27 ENCOUNTER — Other Ambulatory Visit: Payer: Self-pay

## 2018-07-27 VITALS — BP 140/82 | HR 68 | Temp 98.0°F

## 2018-07-27 DIAGNOSIS — J4520 Mild intermittent asthma, uncomplicated: Secondary | ICD-10-CM | POA: Diagnosis not present

## 2018-07-27 DIAGNOSIS — F32A Depression, unspecified: Secondary | ICD-10-CM

## 2018-07-27 DIAGNOSIS — I10 Essential (primary) hypertension: Secondary | ICD-10-CM

## 2018-07-27 DIAGNOSIS — G4733 Obstructive sleep apnea (adult) (pediatric): Secondary | ICD-10-CM

## 2018-07-27 DIAGNOSIS — F419 Anxiety disorder, unspecified: Secondary | ICD-10-CM | POA: Diagnosis not present

## 2018-07-27 DIAGNOSIS — M1990 Unspecified osteoarthritis, unspecified site: Secondary | ICD-10-CM | POA: Diagnosis not present

## 2018-07-27 DIAGNOSIS — K219 Gastro-esophageal reflux disease without esophagitis: Secondary | ICD-10-CM

## 2018-07-27 DIAGNOSIS — E119 Type 2 diabetes mellitus without complications: Secondary | ICD-10-CM | POA: Diagnosis not present

## 2018-07-27 DIAGNOSIS — F329 Major depressive disorder, single episode, unspecified: Secondary | ICD-10-CM

## 2018-07-27 NOTE — Progress Notes (Signed)
I need her to repeat her BP check and she needs to take blood pressure medication at least 2 hours before her check. Checking when she has not taken her BP med doesn't help me.

## 2018-07-27 NOTE — Addendum Note (Signed)
Addended by: Cloyd Stagers on: 07/27/2018 09:11 AM   Modules accepted: Orders

## 2018-07-27 NOTE — Progress Notes (Signed)
Per Webb Silversmith, NP encounter order on 07/23/2018, patient presents today for a nurse visit blood pressure check and labs for ongoing follow up and management.    Vital Sign Readings today BP 140/82 (patient had not taken her morning bp medication yet) , HR 68, TEMP 98.0, O2 SAT: 96% on RA.    Patient is to continue on current medication regimen until further notice from provider after she reviews readings and lab results from the visit today.  Patient verbalizes understanding.

## 2018-07-28 ENCOUNTER — Encounter: Payer: Self-pay | Admitting: Internal Medicine

## 2018-07-28 LAB — CBC
Hematocrit: 44.5 % (ref 34.0–46.6)
Hemoglobin: 14.7 g/dL (ref 11.1–15.9)
MCH: 27.7 pg (ref 26.6–33.0)
MCHC: 33 g/dL (ref 31.5–35.7)
MCV: 84 fL (ref 79–97)
Platelets: 161 10*3/uL (ref 150–450)
RBC: 5.3 x10E6/uL — ABNORMAL HIGH (ref 3.77–5.28)
RDW: 12.6 % (ref 11.7–15.4)
WBC: 4.3 10*3/uL (ref 3.4–10.8)

## 2018-07-28 LAB — VITAMIN D 25 HYDROXY (VIT D DEFICIENCY, FRACTURES): Vit D, 25-Hydroxy: 25.6 ng/mL — ABNORMAL LOW (ref 30.0–100.0)

## 2018-07-28 LAB — COMPREHENSIVE METABOLIC PANEL
ALT: 54 IU/L — ABNORMAL HIGH (ref 0–32)
AST: 50 IU/L — ABNORMAL HIGH (ref 0–40)
Albumin/Globulin Ratio: 2.1 (ref 1.2–2.2)
Albumin: 4.7 g/dL (ref 3.8–4.9)
Alkaline Phosphatase: 81 IU/L (ref 39–117)
BUN/Creatinine Ratio: 14 (ref 9–23)
BUN: 12 mg/dL (ref 6–24)
Bilirubin Total: 0.7 mg/dL (ref 0.0–1.2)
CO2: 25 mmol/L (ref 20–29)
Calcium: 9.9 mg/dL (ref 8.7–10.2)
Chloride: 98 mmol/L (ref 96–106)
Creatinine, Ser: 0.85 mg/dL (ref 0.57–1.00)
GFR calc Af Amer: 92 mL/min/{1.73_m2} (ref >59)
GFR calc non Af Amer: 80 mL/min/{1.73_m2} (ref >59)
Globulin, Total: 2.2 g/dL (ref 1.5–4.5)
Glucose: 281 mg/dL — ABNORMAL HIGH (ref 65–99)
Potassium: 3.9 mmol/L (ref 3.5–5.2)
Sodium: 139 mmol/L (ref 134–144)
Total Protein: 6.9 g/dL (ref 6.0–8.5)

## 2018-07-28 LAB — HEMOGLOBIN A1C
Est. average glucose Bld gHb Est-mCnc: 246 mg/dL
Hgb A1c MFr Bld: 10.2 % — ABNORMAL HIGH (ref 4.8–5.6)

## 2018-07-28 LAB — LIPID PANEL
Chol/HDL Ratio: 3.5 ratio (ref 0.0–4.4)
Cholesterol, Total: 138 mg/dL (ref 100–199)
HDL: 40 mg/dL (ref >39)
LDL Calculated: 74 mg/dL (ref 0–99)
Triglycerides: 121 mg/dL (ref 0–149)
VLDL Cholesterol Cal: 24 mg/dL (ref 5–40)

## 2018-07-28 MED ORDER — GLIPIZIDE 10 MG PO TABS: 10.0000 mg | ORAL_TABLET | Freq: Two times a day (BID) | ORAL | 0 refills | Status: DC

## 2018-07-28 NOTE — Addendum Note (Signed)
Addended by: Lurlean Nanny on: 07/28/2018 02:38 PM   Modules accepted: Orders

## 2018-07-28 NOTE — Addendum Note (Signed)
Addended by: Ellamae Sia on: 07/28/2018 11:52 AM   Modules accepted: Orders

## 2018-08-11 ENCOUNTER — Other Ambulatory Visit: Payer: Self-pay | Admitting: Internal Medicine

## 2018-08-11 ENCOUNTER — Encounter: Payer: Self-pay | Admitting: Internal Medicine

## 2018-08-11 MED ORDER — SITAGLIPTIN PHOSPHATE 100 MG PO TABS: 100.0000 mg | ORAL_TABLET | Freq: Every day | ORAL | 2 refills | Status: DC

## 2018-08-12 ENCOUNTER — Ambulatory Visit: Payer: BC Managed Care – PPO

## 2018-08-12 ENCOUNTER — Encounter: Payer: Self-pay | Admitting: Internal Medicine

## 2018-08-12 DIAGNOSIS — I10 Essential (primary) hypertension: Secondary | ICD-10-CM

## 2018-08-12 NOTE — Progress Notes (Signed)
Patient was seen for nurse visit today and   BP was 134 76 and HR 67  pt denies CP/tightness, SOB or palpitations

## 2018-08-23 ENCOUNTER — Encounter: Payer: Self-pay | Admitting: Internal Medicine

## 2018-08-23 MED ORDER — LANTUS SOLOSTAR 100 UNIT/ML ~~LOC~~ SOPN: 20.0000 [IU] | PEN_INJECTOR | Freq: Every day | SUBCUTANEOUS | 1 refills | Status: DC

## 2018-08-23 MED ORDER — PEN NEEDLES 30G X 8 MM MISC: 1.0000 | Freq: Every day | 1 refills | Status: DC

## 2018-08-24 ENCOUNTER — Ambulatory Visit: Payer: BC Managed Care – PPO | Admitting: Internal Medicine

## 2018-08-24 ENCOUNTER — Encounter: Payer: Self-pay | Admitting: Internal Medicine

## 2018-08-24 MED ORDER — BD PEN NEEDLE MINI U/F 31G X 5 MM MISC: 1.0000 | Freq: Every day | 0 refills | Status: DC

## 2018-08-24 MED ORDER — PEN NEEDLES 31G X 6 MM MISC: 1.0000 | Freq: Every day | 1 refills | Status: DC

## 2018-08-24 MED ORDER — LANTUS SOLOSTAR 100 UNIT/ML ~~LOC~~ SOPN: 20.0000 [IU] | PEN_INJECTOR | Freq: Every day | SUBCUTANEOUS | 0 refills | Status: DC

## 2018-08-24 NOTE — Progress Notes (Deleted)
Subjective:    Patient ID: Sheri Hood, female    DOB: 1965-05-02, 53 y.o.   MRN: [id number]  HPI  Pt presents to the clinic today for diabetic education surrounding self insulin injections. Her most recent A1C was 10.2%, 08/2018. She is taking Glipizide and has been prescribed Lantus, which she has not taken yet. Her sugars range. She checks her feet routinely. Her last eye exam was.   Review of Systems  Past Medical History:  Diagnosis Date  . Allergy   . Anemia    s/p ablation for heavy bleeding   . Anxiety   . Arthritis    arhtritis middle back, scolosis  . Asthma   . Depression   . Diabetes mellitus without complication ([place])   . GERD (gastroesophageal reflux disease)   . Headache(784.0)    h/x migraines  . Heart murmur    recent diagnosed- / ehho 12/30/10 report on chart  . Hiatal hernia   . Hyperlipidemia   . Hypertension   . Recurrent upper respiratory infection (URI)    occ sore throat and slightly stuffy- no fever or cough  . Sleep apnea    On CPAP  . Sleep apnea     Current Outpatient Medications  Medication Sig Dispense Refill  . BAYER MICROLET LANCETS lancets Use as instructed to test blood sugar once daily. E11.9 100 each 12  . cetirizine (ZYRTEC) 10 MG tablet Take 10 mg by mouth daily.    . citalopram (CELEXA) 40 MG tablet TAKE 1 TABLET BY MOUTH ONCE DAILY 90 tablet 3  . diclofenac (VOLTAREN) 75 MG EC tablet TAKE 1 TABLET BY MOUTH TWICE DAILY (Patient taking differently: Take 75 mg by mouth 2 (two) times daily as needed. ) 60 tablet 4  . glipiZIDE (GLUCOTROL) 10 MG tablet Take 1 tablet (10 mg total) by mouth 2 (two) times daily before a meal. 180 tablet 0  . glucose blood (BAYER CONTOUR NEXT TEST) test strip Use as instructed to test blood sugar once daily. E11.9 100 each 12  . hydrochlorothiazide (HYDRODIURIL) 25 MG tablet TAKE 1 TABLET BY MOUTH IN THE MORNING 90 tablet 3  . ibuprofen (ADVIL,MOTRIN) 200 MG tablet Take 200 mg by mouth every 6 (six)  hours as needed. For pain    . Insulin Glargine (LANTUS SOLOSTAR) 100 UNIT/ML Solostar Pen Inject 20 Units into the skin at bedtime. 5 pen 1  . Insulin Pen Needle (PEN NEEDLES) 30G X 8 MM MISC 1 each by Does not apply route daily. 100 each 1  . omeprazole (PRILOSEC) 20 MG capsule Take 20 mg by mouth daily.    . rosuvastatin (CRESTOR) 10 MG tablet Take 1 tablet by mouth once daily 90 tablet 0  . sitaGLIPtin (JANUVIA) 100 MG tablet Take 1 tablet (100 mg total) by mouth daily. 30 tablet 2  . vitamin E 400 UNIT capsule Take 800 Units by mouth at bedtime.      No current facility-administered medications for this visit.     Allergies  Allergen Reactions  . Benadryl [Diphenhydramine Hcl] Other (See Comments)    Keeps awake, jittery  . Codeine Other (See Comments)    Keeps awake  . Dilaudid [Hydromorphone Hcl] Itching and Nausea Only  . Morphine And Related Itching and Nausea Only    Family History  Problem Relation Age of Onset  . Diabetes Father   . Breast cancer Neg Hx   . Colon cancer Neg Hx   . Esophageal cancer Neg Hx   .  Rectal cancer Neg Hx   . Stomach cancer Neg Hx     Social History   Socioeconomic History  . Marital status: Married    Spouse name: Not on file  . Number of children: Not on file  . Years of education: Not on file  . Highest education level: Not on file  Occupational History  . Not on file  Social Needs  . Financial resource strain: Not on file  . Food insecurity    Worry: Not on file    Inability: Not on file  . Transportation needs    Medical: Not on file    Non-medical: Not on file  Tobacco Use  . Smoking status: Former Smoker    Packs/day: 0.50    Years: 4.00    Pack years: 2.00    Types: Cigarettes    Quit date: 01/02/1988    Years since quitting: 30.6  . Smokeless tobacco: Never Used  Substance and Sexual Activity  . Alcohol use: Yes    Alcohol/week: 0.0 standard drinks    Comment: Occasional Drink  . Drug use: No  . Sexual  activity: Never  Lifestyle  . Physical activity    Days per week: Not on file    Minutes per session: Not on file  . Stress: Not on file  Relationships  . Social Herbalist on phone: Not on file    Gets together: Not on file    Attends religious service: Not on file    Active member of club or organization: Not on file    Attends meetings of clubs or organizations: Not on file    Relationship status: Not on file  . Intimate partner violence    Fear of current or ex partner: Not on file    Emotionally abused: Not on file    Physically abused: Not on file    Forced sexual activity: Not on file  Other Topics Concern  . Not on file  Social History Narrative  . Not on file     Constitutional: Denies fever, malaise, fatigue, headache or abrupt weight changes.  HEENT: Denies eye pain, eye redness, ear pain, ringing in the ears, wax buildup, runny nose, nasal congestion, bloody nose, or sore throat. Respiratory: Denies difficulty breathing, shortness of breath, cough or sputum production.   Cardiovascular: Denies chest pain, chest tightness, palpitations or swelling in the hands or feet.  Gastrointestinal: Denies abdominal pain, bloating, constipation, diarrhea or blood in the stool.  GU: Denies urgency, frequency, pain with urination, burning sensation, blood in urine, odor or discharge. Musculoskeletal: Denies decrease in range of motion, difficulty with gait, muscle pain or joint pain and swelling.  Skin: Denies redness, rashes, lesions or ulcercations.  Neurological: Denies dizziness, difficulty with memory, difficulty with speech or problems with balance and coordination.  Psych: Denies anxiety, depression, SI/HI.  No other specific complaints in a complete review of systems (except as listed in HPI above).     Objective:   Physical Exam        Assessment & Plan:

## 2018-08-25 DIAGNOSIS — D485 Neoplasm of uncertain behavior of skin: Secondary | ICD-10-CM | POA: Diagnosis not present

## 2018-08-25 DIAGNOSIS — Z1283 Encounter for screening for malignant neoplasm of skin: Secondary | ICD-10-CM | POA: Diagnosis not present

## 2018-08-25 DIAGNOSIS — D2230 Melanocytic nevi of unspecified part of face: Secondary | ICD-10-CM | POA: Diagnosis not present

## 2018-08-25 DIAGNOSIS — L578 Other skin changes due to chronic exposure to nonionizing radiation: Secondary | ICD-10-CM | POA: Diagnosis not present

## 2018-08-27 ENCOUNTER — Other Ambulatory Visit: Payer: Self-pay

## 2018-08-27 ENCOUNTER — Ambulatory Visit: Payer: BC Managed Care – PPO | Admitting: Internal Medicine

## 2018-08-27 ENCOUNTER — Encounter: Payer: Self-pay | Admitting: Internal Medicine

## 2018-08-27 DIAGNOSIS — Z794 Long term (current) use of insulin: Secondary | ICD-10-CM

## 2018-08-27 DIAGNOSIS — E1165 Type 2 diabetes mellitus with hyperglycemia: Secondary | ICD-10-CM

## 2018-08-27 MED ORDER — FREESTYLE LIBRE 14 DAY READER DEVI: 1.0000 | 2 refills | Status: DC

## 2018-08-27 MED ORDER — FREESTYLE LIBRE 14 DAY SENSOR MISC: 1.0000 | 3 refills | Status: DC

## 2018-08-27 NOTE — Progress Notes (Signed)
Subjective:    Patient ID: Sheri Hood, female    DOB: 24-Jul-1965, 53 y.o.   MRN: [id number]  HPI  Pt presents to the clinic today for insulin teaching. Her most recent A1C was 10.2%. Currently managed on Glipizide 10 mg BID. She did not want to start Januvia. We started Lantus 20 units QHS, she is here to be taught how to self administer.  Review of Systems      Past Medical History:  Diagnosis Date  . Allergy   . Anemia    s/p ablation for heavy bleeding   . Anxiety   . Arthritis    arhtritis middle back, scolosis  . Asthma   . Depression   . Diabetes mellitus without complication ([place])   . GERD (gastroesophageal reflux disease)   . Headache(784.0)    h/x migraines  . Heart murmur    recent diagnosed- / ehho 12/30/10 report on chart  . Hiatal hernia   . Hyperlipidemia   . Hypertension   . Recurrent upper respiratory infection (URI)    occ sore throat and slightly stuffy- no fever or cough  . Sleep apnea    On CPAP  . Sleep apnea     Current Outpatient Medications  Medication Sig Dispense Refill  . BAYER MICROLET LANCETS lancets Use as instructed to test blood sugar once daily. E11.9 100 each 12  . cetirizine (ZYRTEC) 10 MG tablet Take 10 mg by mouth daily.    . citalopram (CELEXA) 40 MG tablet TAKE 1 TABLET BY MOUTH ONCE DAILY 90 tablet 3  . diclofenac (VOLTAREN) 75 MG EC tablet TAKE 1 TABLET BY MOUTH TWICE DAILY (Patient taking differently: Take 75 mg by mouth 2 (two) times daily as needed. ) 60 tablet 4  . glipiZIDE (GLUCOTROL) 10 MG tablet Take 1 tablet (10 mg total) by mouth 2 (two) times daily before a meal. 180 tablet 0  . glucose blood (BAYER CONTOUR NEXT TEST) test strip Use as instructed to test blood sugar once daily. E11.9 100 each 12  . hydrochlorothiazide (HYDRODIURIL) 25 MG tablet TAKE 1 TABLET BY MOUTH IN THE MORNING 90 tablet 3  . ibuprofen (ADVIL,MOTRIN) 200 MG tablet Take 200 mg by mouth every 6 (six) hours as needed. For pain    . Insulin  Glargine (LANTUS SOLOSTAR) 100 UNIT/ML Solostar Pen Inject 20 Units into the skin at bedtime. 30 mL 0  . Insulin Pen Needle (B-D UF III MINI PEN NEEDLES) 31G X 5 MM MISC 1 each by Does not apply route daily. 100 each 0  . omeprazole (PRILOSEC) 20 MG capsule Take 20 mg by mouth daily.    . rosuvastatin (CRESTOR) 10 MG tablet Take 1 tablet by mouth once daily 90 tablet 0  . vitamin E 400 UNIT capsule Take 800 Units by mouth at bedtime.      No current facility-administered medications for this visit.     Allergies  Allergen Reactions  . Benadryl [Diphenhydramine Hcl] Other (See Comments)    Keeps awake, jittery  . Codeine Other (See Comments)    Keeps awake  . Dilaudid [Hydromorphone Hcl] Itching and Nausea Only  . Morphine And Related Itching and Nausea Only    Family History  Problem Relation Age of Onset  . Diabetes Father   . Breast cancer Neg Hx   . Colon cancer Neg Hx   . Esophageal cancer Neg Hx   . Rectal cancer Neg Hx   . Stomach cancer Neg Hx  Social History   Socioeconomic History  . Marital status: Married    Spouse name: Not on file  . Number of children: Not on file  . Years of education: Not on file  . Highest education level: Not on file  Occupational History  . Not on file  Social Needs  . Financial resource strain: Not on file  . Food insecurity    Worry: Not on file    Inability: Not on file  . Transportation needs    Medical: Not on file    Non-medical: Not on file  Tobacco Use  . Smoking status: Former Smoker    Packs/day: 0.50    Years: 4.00    Pack years: 2.00    Types: Cigarettes    Quit date: 01/02/1988    Years since quitting: 30.6  . Smokeless tobacco: Never Used  Substance and Sexual Activity  . Alcohol use: Yes    Alcohol/week: 0.0 standard drinks    Comment: Occasional Drink  . Drug use: No  . Sexual activity: Never  Lifestyle  . Physical activity    Days per week: Not on file    Minutes per session: Not on file  .  Stress: Not on file  Relationships  . Social Herbalist on phone: Not on file    Gets together: Not on file    Attends religious service: Not on file    Active member of club or organization: Not on file    Attends meetings of clubs or organizations: Not on file    Relationship status: Not on file  . Intimate partner violence    Fear of current or ex partner: Not on file    Emotionally abused: Not on file    Physically abused: Not on file    Forced sexual activity: Not on file  Other Topics Concern  . Not on file  Social History Narrative  . Not on file     Constitutional: Denies fever, malaise, fatigue, headache or abrupt weight changes.  Respiratory: Denies difficulty breathing, shortness of breath, cough or sputum production.   Cardiovascular: Denies chest pain, chest tightness, palpitations or swelling in the hands or feet.  Skin: Denies redness, rashes, lesions or ulcercations.    No other specific complaints in a complete review of systems (except as listed in HPI above).  Objective:   Physical Exam    Wt Readings from Last 3 Encounters:  05/19/18 212 lb (96.2 kg)  05/04/18 212 lb 9.6 oz (96.4 kg)  02/12/18 210 lb (95.3 kg)    General: Appears her stated age, obese, in NAD. Skin: Warm, dry and intact. No ulcerations noted. Neurological: Alert and oriented.    BMET    Component Value Date/Time   NA 139 07/27/2018 0911   K 3.9 07/27/2018 0911   CL 98 07/27/2018 0911   CO2 25 07/27/2018 0911   GLUCOSE 281 (H) 07/27/2018 0911   BUN 12 07/27/2018 0911   CREATININE 0.85 07/27/2018 0911   CALCIUM 9.9 07/27/2018 0911   GFRNONAA 80 07/27/2018 0911   GFRAA 92 07/27/2018 0911    Lipid Panel     Component Value Date/Time   CHOL 138 07/27/2018 0911   TRIG 121 07/27/2018 0911   HDL 40 07/27/2018 0911   CHOLHDL 3.5 07/27/2018 0911   LDLCALC 74 07/27/2018 0911    CBC    Component Value Date/Time   WBC 4.3 07/27/2018 0911   RBC 5.30 (H)  07/27/2018 0911  HGB 14.7 07/27/2018 0911   HCT 44.5 07/27/2018 0911   PLT 161 07/27/2018 0911   MCV 84 07/27/2018 0911   MCH 27.7 07/27/2018 0911   MCHC 33.0 07/27/2018 0911   RDW 12.6 07/27/2018 0911   LYMPHSABS 1.7 01/12/2017 1324   EOSABS 0.1 01/12/2017 1324   BASOSABS 0.0 01/12/2017 1324    Hgb A1C Lab Results  Component Value Date   HGBA1C 10.2 (H) 07/27/2018           Assessment & Plan:   DM 2:  Discussed how long acting insulin works Discussed  Importance of monitoring fasting sugars daily Reinforced low carb diet and exercise for weight loss Discussed supplies needed to administer insulin, discussed how to clean area, prep the pen and needle She successfully self injected insulin She understands she needs to get a sharps box for her needles and pens Discussed rotating sights to prevent hypertrophy of the skin and SQ tissue  She will update me in 10 days with fasting sugars, follow up with me in 3 months. Webb Silversmith, NP

## 2018-08-27 NOTE — Patient Instructions (Signed)
Insulin Glargine injection What is this medicine? INSULIN GLARGINE (IN su lin GLAR geen) is a human-made form of insulin. This drug lowers the amount of sugar in your blood. It is a long-acting insulin that is usually given once a day. This medicine may be used for other purposes; ask your health care provider or pharmacist if you have questions. COMMON BRAND NAME(S): BASAGLAR, Lantus, Lantus SoloStar, Toujeo Max SoloStar, Toujeo SoloStar What should I tell my health care provider before I take this medicine? They need to know if you have any of these conditions: -episodes of low blood sugar -eye disease, vision problems -kidney disease -liver disease -an unusual or allergic reaction to insulin, metacresol, other medicines, foods, dyes, or preservatives -pregnant or trying to get pregnant -breast-feeding How should I use this medicine? This medicine is for injection under the skin. Use this medicine at the same time each day. Use exactly as directed. This insulin should never be mixed in the same syringe with other insulins before injection. Do not vigorously shake before use. You will be taught how to use this medicine and how to adjust doses for activities and illness. Do not use more insulin than prescribed. Always check the appearance of your insulin before using it. This medicine should be clear and colorless like water. Do not use it if it is cloudy, thickened, colored, or has solid particles in it. If you use an insulin pen, be sure to take off the outer needle cover before using the dose. It is important that you put your used needles and syringes in a special sharps container. Do not put them in a trash can. If you do not have a sharps container, call your pharmacist or healthcare provider to get one. Talk to your pediatrician regarding the use of this medicine in children. Special care may be needed. Overdosage: If you think you have taken too much of this medicine contact a poison  control center or emergency room at once. NOTE: This medicine is only for you. Do not share this medicine with others. What if I miss a dose? It is important not to miss a dose. Your health care professional or doctor should discuss a plan for missed doses with you. If you do miss a dose, follow their plan. Do not take double doses. What may interact with this medicine? -other medicines for diabetes Many medications may cause changes in blood sugar, these include: -alcohol containing beverages -antiviral medicines for HIV or AIDS -aspirin and aspirin-like drugs -certain medicines for blood pressure, heart disease, irregular heart beat -chromium -diuretics -female hormones, such as estrogens or progestins, birth control pills -fenofibrate -gemfibrozil -isoniazid -lanreotide -female hormones or anabolic steroids -MAOIs like Carbex, Eldepryl, Marplan, Nardil, and Parnate -medicines for weight loss -medicines for allergies, asthma, cold, or cough -medicines for depression, anxiety, or psychotic disturbances -niacin -nicotine -NSAIDs, medicines for pain and inflammation, like ibuprofen or naproxen -octreotide -pasireotide -pentamidine -phenytoin -probenecid -quinolone antibiotics such as ciprofloxacin, levofloxacin, ofloxacin -some herbal dietary supplements -steroid medicines such as prednisone or cortisone -sulfamethoxazole; trimethoprim -thyroid hormones Some medications can hide the warning symptoms of low blood sugar (hypoglycemia). You may need to monitor your blood sugar more closely if you are taking one of these medications. These include: -beta-blockers, often used for high blood pressure or heart problems (examples include atenolol, metoprolol, propranolol) -clonidine -guanethidine -reserpine This list may not describe all possible interactions. Give your health care provider a list of all the medicines, herbs, non-prescription drugs, or dietary supplements  you use. Also  tell them if you smoke, drink alcohol, or use illegal drugs. Some items may interact with your medicine. What should I watch for while using this medicine? Visit your health care professional or doctor for regular checks on your progress. Do not drive, use machinery, or do anything that needs mental alertness until you know how this medicine affects you. Alcohol may interfere with the effect of this medicine. Avoid alcoholic drinks. A test called the HbA1C (A1C) will be monitored. This is a simple blood test. It measures your blood sugar control over the last 2 to 3 months. You will receive this test every 3 to 6 months. Learn how to check your blood sugar. Learn the symptoms of low and high blood sugar and how to manage them. Always carry a quick-source of sugar with you in case you have symptoms of low blood sugar. Examples include hard sugar candy or glucose tablets. Make sure others know that you can choke if you eat or drink when you develop serious symptoms of low blood sugar, such as seizures or unconsciousness. They must get medical help at once. Tell your doctor or health care professional if you have high blood sugar. You might need to change the dose of your medicine. If you are sick or exercising more than usual, you might need to change the dose of your medicine. Do not skip meals. Ask your doctor or health care professional if you should avoid alcohol. Many nonprescription cough and cold products contain sugar or alcohol. These can affect blood sugar. Make sure that you have the right kind of syringe for the type of insulin you use. Try not to change the brand and type of insulin or syringe unless your health care professional or doctor tells you to. Switching insulin brand or type can cause dangerously high or low blood sugar. Always keep an extra supply of insulin, syringes, and needles on hand. Use a syringe one time only. Throw away syringe and needle in a closed container to prevent  accidental needle sticks. Insulin pens and cartridges should never be shared. Even if the needle is changed, sharing may result in passing of viruses like hepatitis or HIV. Each time you get a new box of pen needles, check to see if they are the same type as the ones you were trained to use. If not, ask your health care professional to show you how to use this new type properly. Wear a medical ID bracelet or chain, and carry a card that describes your disease and details of your medicine and dosage times. What side effects may I notice from receiving this medicine? Side effects that you should report to your doctor or health care professional as soon as possible: -allergic reactions like skin rash, itching or hives, swelling of the face, lips, or tongue -breathing problems -signs and symptoms of high blood sugar such as dizziness, dry mouth, dry skin, fruity breath, nausea, stomach pain, increased hunger or thirst, increased urination -signs and symptoms of low blood sugar such as feeling anxious, confusion, dizziness, increased hunger, unusually weak or tired, sweating, shakiness, cold, irritable, headache, blurred vision, fast heartbeat, loss of consciousness Side effects that usually do not require medical attention (report to your doctor or health care professional if they continue or are bothersome): -increase or decrease in fatty tissue under the skin due to overuse of a particular injection site -itching, burning, swelling, or rash at site where injected This list may not describe all possible  as feeling anxious, confusion, dizziness, increased hunger, unusually weak or tired, sweating, shakiness, cold, irritable, headache, blurred vision, fast heartbeat, loss of consciousness  Side effects that usually do not require medical attention (report to your doctor or health care professional if they continue or are bothersome):  -increase or decrease in fatty tissue under the skin due to overuse of a particular injection site  -itching, burning, swelling, or rash at site where injected  This list may not describe all possible side effects. Call your doctor for medical advice about side effects. You may report side effects to FDA at 1-800-FDA-1088.  Where should I keep my medicine?  Keep out of the reach of children.  Store unopened vials in a refrigerator between 2 and 8 degrees C (36 and 46 degrees F). Do not freeze or use if the insulin has been frozen. Opened vials (vials currently in use) may be stored in the refrigerator or  at room temperature, at approximately 25 degrees C (77 degrees F) or cooler. Keeping your insulin at room temperature decreases the amount of pain during injection. Once opened, your insulin can be used for 28 days. After 28 days, the vial should be thrown away.  Store Lantus Solostar Pens or Basaglar KwikPens in a refrigerator between 2 and 8 degrees C (36 and 46 degrees F) or at room temperature below 30 degrees C (86 degrees F). Do not freeze or use if the insulin has been frozen. Once opened, the pens should be kept at room temperature. Do not store in the refrigerator once opened. Once opened, the insulin can be used for 28 days. After 28 days, the Lantus Solostar Pen or Basaglar KwikPen should be thrown away.  Store Toujeo and Toujeo Max Solostar Pens in a refrigerator between 2 and 8 degrees C (36 and 46 degrees F). Do not freeze or use if the insulin has been frozen. Once opened, the pens should be kept at room temperature below 30 degrees C (86 degrees F). Do not store in the refrigerator once opened. Once opened, the insulin can be used for 56 days. After 56 days, the Toujeo or Toujeo Max Solostar Pen should be thrown away.  Protect from light and excessive heat. Throw away any unused medicine after the expiration date or after the specified time for room temperature storage has passed.  NOTE: This sheet is a summary. It may not cover all possible information. If you have questions about this medicine, talk to your doctor, pharmacist, or health care provider.   2019 [employer] (2017-08-20 15:06:27)

## 2018-09-07 ENCOUNTER — Other Ambulatory Visit: Payer: Self-pay | Admitting: Internal Medicine

## 2018-09-12 ENCOUNTER — Encounter: Payer: Self-pay | Admitting: Internal Medicine

## 2018-09-14 ENCOUNTER — Encounter: Payer: Self-pay | Admitting: Internal Medicine

## 2018-09-15 MED ORDER — GLUCOSE BLOOD VI STRP: ORAL_STRIP | 12 refills | Status: AC

## 2018-09-22 ENCOUNTER — Encounter: Payer: Self-pay | Admitting: Internal Medicine

## 2018-09-23 ENCOUNTER — Ambulatory Visit (INDEPENDENT_AMBULATORY_CARE_PROVIDER_SITE_OTHER): Payer: BC Managed Care – PPO | Admitting: Internal Medicine

## 2018-09-23 ENCOUNTER — Encounter: Payer: Self-pay | Admitting: Internal Medicine

## 2018-09-23 ENCOUNTER — Other Ambulatory Visit: Payer: Self-pay

## 2018-09-23 VITALS — Temp 99.7°F

## 2018-09-23 DIAGNOSIS — R0789 Other chest pain: Secondary | ICD-10-CM

## 2018-09-23 DIAGNOSIS — R05 Cough: Secondary | ICD-10-CM | POA: Diagnosis not present

## 2018-09-23 DIAGNOSIS — Z20828 Contact with and (suspected) exposure to other viral communicable diseases: Secondary | ICD-10-CM | POA: Diagnosis not present

## 2018-09-23 DIAGNOSIS — R197 Diarrhea, unspecified: Secondary | ICD-10-CM

## 2018-09-23 DIAGNOSIS — R059 Cough, unspecified: Secondary | ICD-10-CM

## 2018-09-23 DIAGNOSIS — R509 Fever, unspecified: Secondary | ICD-10-CM | POA: Diagnosis not present

## 2018-09-23 DIAGNOSIS — R0602 Shortness of breath: Secondary | ICD-10-CM

## 2018-09-23 DIAGNOSIS — Z20822 Contact with and (suspected) exposure to covid-19: Secondary | ICD-10-CM

## 2018-09-23 NOTE — Patient Instructions (Signed)
COVID-19 Frequently Asked Questions °COVID-19 (coronavirus disease) is an infection that is caused by a large family of viruses. Some viruses cause illness in people and others cause illness in animals like camels, cats, and bats. In some cases, the viruses that cause illness in animals can spread to humans. °Where did the coronavirus come from? °In December 2019, [place] told the World Health Organization (WHO) of several cases of lung disease (human respiratory illness). These cases were linked to an open seafood and livestock market in the city of [place]. The link to the seafood and livestock market suggests that the virus may have spread from animals to humans. However, since that first outbreak in December, the virus has also been shown to spread from person to person. °What is the name of the disease and the virus? °Disease name °Early on, this disease was called novel coronavirus. This is because scientists determined that the disease was caused by a new (novel) respiratory virus. The World Health Organization (WHO) has now named the disease COVID-19, or coronavirus disease. °Virus name °The virus that causes the disease is called severe acute respiratory syndrome coronavirus 2 (SARS-CoV-2). °More information on disease and virus naming °World Health Organization (WHO): www.who.int/emergencies/diseases/novel-coronavirus-2019/technical-guidance/naming-the-coronavirus-disease-(covid-2019)-and-the-virus-that-causes-it °Who is at risk for complications from coronavirus disease? °Some people may be at higher risk for complications from coronavirus disease. This includes older adults and people who have chronic diseases, such as heart disease, diabetes, and lung disease. °If you are at higher risk for complications, take these extra precautions: °· Avoid close contact with people who are sick or have a fever or cough. Stay at least 3-6 ft (1-2 m) away from them, if possible. °· Wash your hands often with soap and  water for at least 20 seconds. °· Avoid touching your face, mouth, nose, or eyes. °· Keep supplies on hand at home, such as food, medicine, and cleaning supplies. °· Stay home as much as possible. °· Avoid social gatherings and travel. °How does coronavirus disease spread? °The virus that causes coronavirus disease spreads easily from person to person (is contagious). There are also cases of community-spread disease. This means the disease has spread to: °· People who have no known contact with other infected people. °· People who have not traveled to areas where there are known cases. °It appears to spread from one person to another through droplets from coughing or sneezing. °Can I get the virus from touching surfaces or objects? °There is still a lot that we do not know about the virus that causes coronavirus disease. Scientists are basing a lot of information on what they know about similar viruses, such as: °· Viruses cannot generally survive on surfaces for long. They need a human body (host) to survive. °· It is more likely that the virus is spread by close contact with people who are sick (direct contact), such as through: °? Shaking hands or hugging. °? Breathing in respiratory droplets that travel through the air. This can happen when an infected person coughs or sneezes on or near other people. °· It is less likely that the virus is spread when a person touches a surface or object that has the virus on it (indirect contact). The virus may be able to enter the body if the person touches a surface or object and then touches his or her face, eyes, nose, or mouth. °Can a person spread the virus without having symptoms of the disease? °It may be possible for the virus to spread before a person   has symptoms of the disease, but this is most likely not the main way the virus is spreading. It is more likely for the virus to spread by being in close contact with people who are sick and breathing in the respiratory  droplets of a sick person's cough or sneeze. °What are the symptoms of coronavirus disease? °Symptoms vary from person to person and can range from mild to severe. Symptoms may include: °· Fever. °· Cough. °· Tiredness, weakness, or fatigue. °· Fast breathing or feeling short of breath. °These symptoms can appear anywhere from 2 to 14 days after you have been exposed to the virus. If you develop symptoms, call your health care provider. People with severe symptoms may need hospital care. °If I am exposed to the virus, how long does it take before symptoms start? °Symptoms of coronavirus disease may appear anywhere from 2 to 14 days after a person has been exposed to the virus. If you develop symptoms, call your health care provider. °Should I be tested for this virus? °Your health care provider will decide whether to test you based on your symptoms, history of exposure, and your risk factors. °How does a health care provider test for this virus? °Health care providers will collect samples to send for testing. Samples may include: °· Taking a swab of fluid from the nose. °· Taking fluid from the lungs by having you cough up mucus (sputum) into a sterile cup. °· Taking a blood sample. °· Taking a stool or urine sample. °Is there a treatment or vaccine for this virus? °Currently, there is no vaccine to prevent coronavirus disease. Also, there are no medicines like antibiotics or antivirals to treat the virus. A person who becomes sick is given supportive care, which means rest and fluids. A person may also relieve his or her symptoms by using over-the-counter medicines that treat sneezing, coughing, and runny nose. These are the same medicines that a person takes for the common cold. °If you develop symptoms, call your health care provider. People with severe symptoms may need hospital care. °What can I do to protect myself and my family from this virus? ° °  ° °You can protect yourself and your family by taking the  same actions that you would take to prevent the spread of other viruses. Take the following actions: °· Wash your hands often with soap and water for at least 20 seconds. If soap and water are not available, use alcohol-based hand sanitizer. °· Avoid touching your face, mouth, nose, or eyes. °· Cough or sneeze into a tissue, sleeve, or elbow. Do not cough or sneeze into your hand or the air. °? If you cough or sneeze into a tissue, throw it away immediately and wash your hands. °· Disinfect objects and surfaces that you frequently touch every day. °· Avoid close contact with people who are sick or have a fever or cough. Stay at least 3-6 ft (1-2 m) away from them, if possible. °· Stay home if you are sick, except to get medical care. Call your health care provider before you get medical care. °· Make sure your vaccines are up to date. Ask your health care provider what vaccines you need. °What should I do if I need to travel? °Follow travel recommendations from your local health authority, the CDC, and WHO. °Travel information and advice °· Centers for Disease Control and Prevention (CDC): www.cdc.gov/coronavirus/2019-ncov/travelers/index.html °· World Health Organization (WHO): www.who.int/emergencies/diseases/novel-coronavirus-2019/travel-advice °Know the risks and take action to protect your health °·   You are at higher risk of getting coronavirus disease if you are traveling to areas with an outbreak or if you are exposed to travelers from areas with an outbreak. °· Wash your hands often and practice good hygiene to lower the risk of catching or spreading the virus. °What should I do if I am sick? °General instructions to stop the spread of infection °· Wash your hands often with soap and water for at least 20 seconds. If soap and water are not available, use alcohol-based hand sanitizer. °· Cough or sneeze into a tissue, sleeve, or elbow. Do not cough or sneeze into your hand or the air. °· If you cough or  sneeze into a tissue, throw it away immediately and wash your hands. °· Stay home unless you must get medical care. Call your health care provider or local health authority before you get medical care. °· Avoid public areas. Do not take public transportation, if possible. °· If you can, wear a mask if you must go out of the house or if you are in close contact with someone who is not sick. °Keep your home clean °· Disinfect objects and surfaces that are frequently touched every day. This may include: °? Counters and tables. °? Doorknobs and light switches. °? Sinks and faucets. °? Electronics such as phones, remote controls, keyboards, computers, and tablets. °· Wash dishes in hot, soapy water or use a dishwasher. Air-dry your dishes. °· Wash laundry in hot water. °Prevent infecting other household members °· Let healthy household members care for children and pets, if possible. If you have to care for children or pets, wash your hands often and wear a mask. °· Sleep in a different bedroom or bed, if possible. °· Do not share personal items, such as razors, toothbrushes, deodorant, combs, brushes, towels, and washcloths. °Where to find more information °Centers for Disease Control and Prevention (CDC) °· Information and news updates: www.cdc.gov/coronavirus/2019-ncov °World Health Organization (WHO) °· Information and news updates: www.who.int/emergencies/diseases/novel-coronavirus-2019 °· Coronavirus health topic: www.who.int/health-topics/coronavirus °· Questions and answers on COVID-19: www.who.int/news-room/q-a-detail/q-a-coronaviruses °· Global tracker: who.sprinklr.com °American Academy of Pediatrics (AAP) °· Information for families: www.healthychildren.org/English/health-issues/conditions/chest-lungs/Pages/2019-Novel-Coronavirus.aspx °The coronavirus situation is changing rapidly. Check your local health authority website or the CDC and WHO websites for updates and news. °When should I contact a health care  provider? °· Contact your health care provider if you have symptoms of an infection, such as fever or cough, and you: °? Have been near anyone who is known to have coronavirus disease. °? Have come into contact with a person who is suspected to have coronavirus disease. °? Have traveled outside of the country. °When should I get emergency medical care? °· Get help right away by calling your local emergency services (911 in the [place] if you have: °? Trouble breathing. °? Pain or pressure in your chest. °? Confusion. °? Blue-tinged lips and fingernails. °? Difficulty waking from sleep. °? Symptoms that get worse. °Let the emergency medical personnel know if you think you have coronavirus disease. °Summary °· A new respiratory virus is spreading from person to person and causing COVID-19 (coronavirus disease). °· The virus that causes COVID-19 appears to spread easily. It spreads from one person to another through droplets from coughing or sneezing. °· Older adults and those with chronic diseases are at higher risk of disease. If you are at higher risk for complications, take extra precautions. °· There is currently no vaccine to prevent coronavirus disease. There are no medicines, such as antibiotics or   antivirals, to treat the virus. °· You can protect yourself and your family by washing your hands often, avoiding touching your face, and covering your coughs and sneezes. °This information is not intended to replace advice given to you by your health care provider. Make sure you discuss any questions you have with your health care provider. °Document Released: 06/15/2018 Document Revised: 06/15/2018 Document Reviewed: 06/15/2018 °Elsevier Patient Education © 2020 [employer] ° °

## 2018-09-23 NOTE — Progress Notes (Signed)
Virtual Visit via Video Note  I connected with Sheri Hood on 09/23/18 at  2:00 PM EDT by a video enabled telemedicine application and verified that I am speaking with the correct person using two identifiers.  Location: Patient: Home Provider: Office   I discussed the limitations of evaluation and management by telemedicine and the availability of in person appointments. The patient expressed understanding and agreed to proceed.  History of Present Illness:  Patient reports fatigue sore throat, cough and chest tightness. This started 2 days ago. She denies difficulty swallowing. The cough is non productive. She has mild shortness of breath bu no chest pain. She reports her fever has been as high as 101.2. She has had body aches but no chills. She denies runny nose, nasal congestion, ear pain. She has tried Ibuprofen with minimal relief. She has had positive exposure to COVID 19.    Past Medical History:  Diagnosis Date  . Allergy   . Anemia    s/p ablation for heavy bleeding   . Anxiety   . Arthritis    arhtritis middle back, scolosis  . Asthma   . Depression   . Diabetes mellitus without complication ([place])   . GERD (gastroesophageal reflux disease)   . Headache(784.0)    h/x migraines  . Heart murmur    recent diagnosed- / [facility] 12/30/10 report on chart  . Hiatal hernia   . Hyperlipidemia   . Hypertension   . Recurrent upper respiratory infection (URI)    occ sore throat and slightly stuffy- no fever or cough  . Sleep apnea    On CPAP  . Sleep apnea     Current Outpatient Medications  Medication Sig Dispense Refill  . BAYER MICROLET LANCETS lancets Use as instructed to test blood sugar once daily. E11.9 100 each 12  . cetirizine (ZYRTEC) 10 MG tablet Take 10 mg by mouth daily.    . citalopram (CELEXA) 40 MG tablet TAKE 1 TABLET BY MOUTH ONCE DAILY 90 tablet 3  . Continuous Blood Gluc Receiver (FREESTYLE LIBRE 14 DAY READER) DEVI 1 Device by Does not apply route  every morning. 1 each 2  . Continuous Blood Gluc Sensor (FREESTYLE LIBRE 14 DAY SENSOR) MISC 1 Device by Does not apply route every 14 (fourteen) days. 6 each 3  . diclofenac (VOLTAREN) 75 MG EC tablet TAKE 1 TABLET BY MOUTH TWICE DAILY (Patient taking differently: Take 75 mg by mouth 2 (two) times daily as needed. ) 60 tablet 4  . glipiZIDE (GLUCOTROL) 10 MG tablet Take 1 tablet (10 mg total) by mouth 2 (two) times daily before a meal. 180 tablet 0  . glucose blood (BAYER CONTOUR NEXT TEST) test strip Use as instructed to test blood sugar once daily. E11.9 100 each 12  . hydrochlorothiazide (HYDRODIURIL) 25 MG tablet TAKE 1 TABLET BY MOUTH IN THE MORNING 90 tablet 3  . ibuprofen (ADVIL,MOTRIN) 200 MG tablet Take 200 mg by mouth every 6 (six) hours as needed. For pain    . Insulin Glargine (LANTUS SOLOSTAR) 100 UNIT/ML Solostar Pen Inject 20 Units into the skin at bedtime. 30 mL 0  . Insulin Pen Needle (B-D UF III MINI PEN NEEDLES) 31G X 5 MM MISC 1 each by Does not apply route daily. 100 each 0  . omeprazole (PRILOSEC) 20 MG capsule Take 20 mg by mouth daily.    . rosuvastatin (CRESTOR) 10 MG tablet Take 1 tablet by mouth once daily 90 tablet 1  . vitamin  E 400 UNIT capsule Take 800 Units by mouth at bedtime.      No current facility-administered medications for this visit.     Allergies  Allergen Reactions  . Benadryl [Diphenhydramine Hcl] Other (See Comments)    Keeps awake, jittery  . Codeine Other (See Comments)    Keeps awake  . Dilaudid [Hydromorphone Hcl] Itching and Nausea Only  . Morphine And Related Itching and Nausea Only    Family History  Problem Relation Age of Onset  . Diabetes Father   . Breast cancer Neg Hx   . Colon cancer Neg Hx   . Esophageal cancer Neg Hx   . Rectal cancer Neg Hx   . Stomach cancer Neg Hx     Social History   Socioeconomic History  . Marital status: Married    Spouse name: Not on file  . Number of children: Not on file  . Years of  education: Not on file  . Highest education level: Not on file  Occupational History  . Not on file  Social Needs  . Financial resource strain: Not on file  . Food insecurity    Worry: Not on file    Inability: Not on file  . Transportation needs    Medical: Not on file    Non-medical: Not on file  Tobacco Use  . Smoking status: Former Smoker    Packs/day: 0.50    Years: 4.00    Pack years: 2.00    Types: Cigarettes    Quit date: 01/02/1988    Years since quitting: 30.7  . Smokeless tobacco: Never Used  Substance and Sexual Activity  . Alcohol use: Yes    Alcohol/week: 0.0 standard drinks    Comment: Occasional Drink  . Drug use: No  . Sexual activity: Never  Lifestyle  . Physical activity    Days per week: Not on file    Minutes per session: Not on file  . Stress: Not on file  Relationships  . Social Herbalist on phone: Not on file    Gets together: Not on file    Attends religious service: Not on file    Active member of club or organization: Not on file    Attends meetings of clubs or organizations: Not on file    Relationship status: Not on file  . Intimate partner violence    Fear of current or ex partner: Not on file    Emotionally abused: Not on file    Physically abused: Not on file    Forced sexual activity: Not on file  Other Topics Concern  . Not on file  Social History Narrative  . Not on file     Constitutional: Pt reports fatigue, fever, body aches. Denies headache or abrupt weight changes.  HEENT: Pt reports sore throat. Denies eye pain, eye redness, ear pain, ringing in the ears, wax buildup, runny nose, nasal congestion, bloody nose. Respiratory: Pt reports cough and SOB. Denies difficulty breathing, or sputum production.   Cardiovascular: Pt reports chest tightness. Denies chest pain, palpitations or swelling in the hands or feet.  Gastrointestinal: Pt reports diarrhea. Denies abdominal pain, bloating, constipation, or blood in the  stool.  Skin: Denies redness, rashes, lesions or ulcercations.    No other specific complaints in a complete review of systems (except as listed in HPI above).  Observations/Objective:  There were no vitals taken for this visit. Wt Readings from Last 3 Encounters:  05/19/18 212 lb (96.2  kg)  05/04/18 212 lb 9.6 oz (96.4 kg)  02/12/18 210 lb (95.3 kg)    General: Appears her stated age, ill appearing, in NAD. Skin: Clammy HEENT: Head: normal shape and size;  Pulmonary/Chest: Normal effort. No respiratory distress.  Neurological: Alert and oriented.  Psychiatric: Mood and affect normal. Behavior is normal. Judgment and thought content normal.     BMET    Component Value Date/Time   NA 139 07/27/2018 0911   K 3.9 07/27/2018 0911   CL 98 07/27/2018 0911   CO2 25 07/27/2018 0911   GLUCOSE 281 (H) 07/27/2018 0911   BUN 12 07/27/2018 0911   CREATININE 0.85 07/27/2018 0911   CALCIUM 9.9 07/27/2018 0911   GFRNONAA 80 07/27/2018 0911   GFRAA 92 07/27/2018 0911    Lipid Panel     Component Value Date/Time   CHOL 138 07/27/2018 0911   TRIG 121 07/27/2018 0911   HDL 40 07/27/2018 0911   CHOLHDL 3.5 07/27/2018 0911   LDLCALC 74 07/27/2018 0911    CBC    Component Value Date/Time   WBC 4.3 07/27/2018 0911   RBC 5.30 (H) 07/27/2018 0911   HGB 14.7 07/27/2018 0911   HCT 44.5 07/27/2018 0911   PLT 161 07/27/2018 0911   MCV 84 07/27/2018 0911   MCH 27.7 07/27/2018 0911   MCHC 33.0 07/27/2018 0911   RDW 12.6 07/27/2018 0911   LYMPHSABS 1.7 01/12/2017 1324   EOSABS 0.1 01/12/2017 1324   BASOSABS 0.0 01/12/2017 1324    Hgb A1C Lab Results  Component Value Date   HGBA1C 10.2 (H) 07/27/2018       Assessment and Plan:  Fever, Body Aches, Sore Throat, Cough, SOB, Diarrhea:  Will have her tested for Novel Coronavirus SARS 2 Instructed her to proceed to the [facility] building for drive up testing Discussed the importance of hand washing, mask wearing,  quarantining Continue Ibuprofen Try Zyrtec or Mucinex for cough  Return precautions discussed  Follow Up Instructions:    I discussed the assessment and treatment plan with the patient. The patient was provided an opportunity to ask questions and all were answered. The patient agreed with the plan and demonstrated an understanding of the instructions.   The patient was advised to call back or seek an in-person evaluation if the symptoms worsen or if the condition fails to improve as anticipated.    Webb Silversmith, NP

## 2018-09-24 ENCOUNTER — Telehealth: Payer: Self-pay | Admitting: Internal Medicine

## 2018-09-24 NOTE — Telephone Encounter (Signed)
Paperwork faxed °

## 2018-09-24 NOTE — Telephone Encounter (Signed)
Attending provider statement in regina's in box For review and signature

## 2018-09-24 NOTE — Telephone Encounter (Signed)
Done, in my outbox

## 2018-09-26 ENCOUNTER — Encounter: Payer: Self-pay | Admitting: Internal Medicine

## 2018-09-26 LAB — NOVEL CORONAVIRUS, NAA: SARS-CoV-2, NAA: NOT DETECTED

## 2018-09-27 ENCOUNTER — Encounter: Payer: Self-pay | Admitting: Internal Medicine

## 2018-09-28 ENCOUNTER — Encounter: Payer: Self-pay | Admitting: Internal Medicine

## 2018-10-01 NOTE — Telephone Encounter (Signed)
Copy for pt Copy for scan Pt aware

## 2018-10-19 ENCOUNTER — Other Ambulatory Visit: Payer: Self-pay | Admitting: Internal Medicine

## 2018-10-19 DIAGNOSIS — Z1231 Encounter for screening mammogram for malignant neoplasm of breast: Secondary | ICD-10-CM

## 2018-10-21 ENCOUNTER — Encounter: Payer: Self-pay | Admitting: Internal Medicine

## 2018-10-25 MED ORDER — BD PEN NEEDLE MINI U/F 31G X 5 MM MISC: 1.0000 | Freq: Every day | 1 refills | Status: DC

## 2018-10-25 MED ORDER — LANTUS SOLOSTAR 100 UNIT/ML ~~LOC~~ SOPN: 28.0000 [IU] | PEN_INJECTOR | Freq: Every day | SUBCUTANEOUS | 1 refills | Status: DC

## 2018-10-25 NOTE — Addendum Note (Signed)
Addended by: Jearld Fenton on: 10/25/2018 10:27 AM   Modules accepted: Orders

## 2018-10-29 ENCOUNTER — Ambulatory Visit
Admission: RE | Admit: 2018-10-29 | Discharge: 2018-10-29 | Disposition: A | Discharge status: Home / Self Care | Payer: BC Managed Care – PPO | Source: Ambulatory Visit | Attending: Internal Medicine | Admitting: Internal Medicine

## 2018-10-29 DIAGNOSIS — Z1231 Encounter for screening mammogram for malignant neoplasm of breast: Secondary | ICD-10-CM | POA: Diagnosis not present

## 2018-11-05 ENCOUNTER — Other Ambulatory Visit: Payer: Self-pay | Admitting: Internal Medicine

## 2018-11-05 MED ORDER — GLIPIZIDE 10 MG PO TABS: 10.0000 mg | ORAL_TABLET | Freq: Two times a day (BID) | ORAL | 0 refills | Status: DC

## 2018-11-24 ENCOUNTER — Encounter: Payer: Self-pay | Admitting: Internal Medicine

## 2018-11-24 ENCOUNTER — Other Ambulatory Visit: Payer: Self-pay

## 2018-11-24 ENCOUNTER — Ambulatory Visit: Payer: BC Managed Care – PPO | Admitting: Internal Medicine

## 2018-11-24 VITALS — BP 128/76 | HR 72 | Temp 98.1°F | Wt 215.0 lb

## 2018-11-24 DIAGNOSIS — E1165 Type 2 diabetes mellitus with hyperglycemia: Secondary | ICD-10-CM

## 2018-11-24 DIAGNOSIS — Z794 Long term (current) use of insulin: Secondary | ICD-10-CM

## 2018-11-24 LAB — POCT GLYCOSYLATED HEMOGLOBIN (HGB A1C): Hemoglobin A1C: 9.2 % — AB (ref 4.0–5.6)

## 2018-11-24 MED ORDER — LANTUS SOLOSTAR 100 UNIT/ML ~~LOC~~ SOPN: 34.0000 [IU] | PEN_INJECTOR | Freq: Every day | SUBCUTANEOUS | 1 refills | Status: DC

## 2018-11-24 NOTE — Patient Instructions (Signed)

## 2018-11-24 NOTE — Progress Notes (Signed)
Subjective:    Patient ID: Sheri Hood, female    DOB: [birth year], 53 y.o.   MRN: [id number]  HPI  Pt presents to the clinic today for 3 month follow up of DM 2. Her last A1C was 10.7%, 08/2018. She is managed on Glipizide and Lantus 28 units. She reports her fasting sugars range 200-215. She is not consuming a low carb diet or exercising. She gets her eye exam yearly. She does not routinely check her feet. Pneumovax 01/2017. She has not gotten her flu shot this year. Microalbumin checked 01/2018. She is on Rosuvastatin for secondary prevention.  Review of Systems      Past Medical History:  Diagnosis Date  . Allergy   . Anemia    s/p ablation for heavy bleeding   . Anxiety   . Arthritis    arhtritis middle back, scolosis  . Asthma   . Depression   . Diabetes mellitus without complication ([place])   . GERD (gastroesophageal reflux disease)   . Headache(784.0)    h/x migraines  . Heart murmur    recent diagnosed- / ehho 12/30/10 report on chart  . Hiatal hernia   . Hyperlipidemia   . Hypertension   . Recurrent upper respiratory infection (URI)    occ sore throat and slightly stuffy- no fever or cough  . Sleep apnea    On CPAP  . Sleep apnea     Current Outpatient Medications  Medication Sig Dispense Refill  . BAYER MICROLET LANCETS lancets Use as instructed to test blood sugar once daily. E11.9 100 each 12  . cetirizine (ZYRTEC) 10 MG tablet Take 10 mg by mouth daily.    . citalopram (CELEXA) 40 MG tablet TAKE 1 TABLET BY MOUTH ONCE DAILY 90 tablet 3  . Continuous Blood Gluc Receiver (FREESTYLE LIBRE 14 DAY READER) DEVI 1 Device by Does not apply route every morning. 1 each 2  . Continuous Blood Gluc Sensor (FREESTYLE LIBRE 14 DAY SENSOR) MISC 1 Device by Does not apply route every 14 (fourteen) days. 6 each 3  . diclofenac (VOLTAREN) 75 MG EC tablet TAKE 1 TABLET BY MOUTH TWICE DAILY (Patient taking differently: Take 75 mg by mouth 2 (two) times daily as needed. ) 60  tablet 4  . glipiZIDE (GLUCOTROL) 10 MG tablet Take 1 tablet (10 mg total) by mouth 2 (two) times daily before a meal. 180 tablet 0  . glucose blood (BAYER CONTOUR NEXT TEST) test strip Use as instructed to test blood sugar once daily. E11.9 100 each 12  . hydrochlorothiazide (HYDRODIURIL) 25 MG tablet TAKE 1 TABLET BY MOUTH IN THE MORNING 90 tablet 3  . ibuprofen (ADVIL,MOTRIN) 200 MG tablet Take 200 mg by mouth every 6 (six) hours as needed. For pain    . Insulin Glargine (LANTUS SOLOSTAR) 100 UNIT/ML Solostar Pen Inject 28 Units into the skin at bedtime. 10 pen 1  . Insulin Pen Needle (B-D UF III MINI PEN NEEDLES) 31G X 5 MM MISC 1 each by Does not apply route daily. 100 each 1  . omeprazole (PRILOSEC) 20 MG capsule Take 20 mg by mouth daily.    . rosuvastatin (CRESTOR) 10 MG tablet Take 1 tablet by mouth once daily 90 tablet 1  . vitamin E 400 UNIT capsule Take 800 Units by mouth at bedtime.      No current facility-administered medications for this visit.     Allergies  Allergen Reactions  . Benadryl [Diphenhydramine Hcl] Other (See Comments)  Keeps awake, jittery  . Codeine Other (See Comments)    Keeps awake  . Dilaudid [Hydromorphone Hcl] Itching and Nausea Only  . Morphine And Related Itching and Nausea Only    Family History  Problem Relation Age of Onset  . Diabetes Father   . Breast cancer Neg Hx   . Colon cancer Neg Hx   . Esophageal cancer Neg Hx   . Rectal cancer Neg Hx   . Stomach cancer Neg Hx     Social History   Socioeconomic History  . Marital status: Married    Spouse name: Not on file  . Number of children: Not on file  . Years of education: Not on file  . Highest education level: Not on file  Occupational History  . Not on file  Social Needs  . Financial resource strain: Not on file  . Food insecurity    Worry: Not on file    Inability: Not on file  . Transportation needs    Medical: Not on file    Non-medical: Not on file  Tobacco Use  .  Smoking status: Former Smoker    Packs/day: 0.50    Years: 4.00    Pack years: 2.00    Types: Cigarettes    Quit date: 01/02/1988    Years since quitting: 30.9  . Smokeless tobacco: Never Used  Substance and Sexual Activity  . Alcohol use: Yes    Alcohol/week: 0.0 standard drinks    Comment: Occasional Drink  . Drug use: No  . Sexual activity: Never  Lifestyle  . Physical activity    Days per week: Not on file    Minutes per session: Not on file  . Stress: Not on file  Relationships  . Social Herbalist on phone: Not on file    Gets together: Not on file    Attends religious service: Not on file    Active member of club or organization: Not on file    Attends meetings of clubs or organizations: Not on file    Relationship status: Not on file  . Intimate partner violence    Fear of current or ex partner: Not on file    Emotionally abused: Not on file    Physically abused: Not on file    Forced sexual activity: Not on file  Other Topics Concern  . Not on file  Social History Narrative  . Not on file     Constitutional: Denies fever, malaise, fatigue, headache or abrupt weight changes.  HEENT: Denies eye pain, eye redness, ear pain, ringing in the ears, wax buildup, runny nose, nasal congestion, bloody nose, or sore throat. Respiratory: Denies difficulty breathing, shortness of breath, cough or sputum production.   Cardiovascular: Denies chest pain, chest tightness, palpitations or swelling in the hands or feet.  Gastrointestinal: Denies abdominal pain, bloating, constipation, diarrhea or blood in the stool.  GU: Denies urgency, frequency, pain with urination, burning sensation, blood in urine, odor or discharge. Skin: Denies redness, rashes, lesions or ulcercations.  Neurological: Denies dizziness, difficulty with memory, difficulty with speech or problems with balance and coordination.    No other specific complaints in a complete review of systems (except as  listed in HPI above).  Objective:   Physical Exam    BP 128/76   Pulse 72   Temp 98.1 F (36.7 C) (Temporal)   Wt 215 lb (97.5 kg)   SpO2 97%   BMI 39.32 kg/m  Wt Readings  from Last 3 Encounters:  11/24/18 215 lb (97.5 kg)  05/19/18 212 lb (96.2 kg)  05/04/18 212 lb 9.6 oz (96.4 kg)    General: Appears her stated age, obese, in NAD. Skin: Warm, dry and intact. No ulcerations noted. Cardiovascular: Normal rate and rhythm. S1,S2 noted.  No murmur, rubs or gallops noted.  Pulmonary/Chest: Normal effort and positive vesicular breath sounds. No respiratory distress. No wheezes, rales or ronchi noted.  Neurological: Alert and oriented. Sensation intact to BLE.   BMET    Component Value Date/Time   NA 139 07/27/2018 0911   K 3.9 07/27/2018 0911   CL 98 07/27/2018 0911   CO2 25 07/27/2018 0911   GLUCOSE 281 (H) 07/27/2018 0911   BUN 12 07/27/2018 0911   CREATININE 0.85 07/27/2018 0911   CALCIUM 9.9 07/27/2018 0911   GFRNONAA 80 07/27/2018 0911   GFRAA 92 07/27/2018 0911    Lipid Panel     Component Value Date/Time   CHOL 138 07/27/2018 0911   TRIG 121 07/27/2018 0911   HDL 40 07/27/2018 0911   CHOLHDL 3.5 07/27/2018 0911   LDLCALC 74 07/27/2018 0911    CBC    Component Value Date/Time   WBC 4.3 07/27/2018 0911   RBC 5.30 (H) 07/27/2018 0911   HGB 14.7 07/27/2018 0911   HCT 44.5 07/27/2018 0911   PLT 161 07/27/2018 0911   MCV 84 07/27/2018 0911   MCH 27.7 07/27/2018 0911   MCHC 33.0 07/27/2018 0911   RDW 12.6 07/27/2018 0911   LYMPHSABS 1.7 01/12/2017 1324   EOSABS 0.1 01/12/2017 1324   BASOSABS 0.0 01/12/2017 1324    Hgb A1C Lab Results  Component Value Date   HGBA1C 9.2 (A) 11/24/2018          Assessment & Plan:

## 2018-11-24 NOTE — Assessment & Plan Note (Signed)
POCT A1C 9.2% Discussed the importance of low carb diet and exercise for weight loss She declines referral to nutrition at this time Continue Glipizide Increase Lantus to 34 units daily She declines referral to endocrinology Urine microalbumin due 02/2019 Foot exam today Will request copy of her recent eye exam Pneumovax UTD She wants to wait to get a flu shot until October

## 2018-12-13 ENCOUNTER — Other Ambulatory Visit: Payer: Self-pay | Admitting: Family Medicine

## 2019-01-05 DIAGNOSIS — L720 Epidermal cyst: Secondary | ICD-10-CM | POA: Diagnosis not present

## 2019-01-11 ENCOUNTER — Other Ambulatory Visit: Payer: Self-pay

## 2019-01-11 DIAGNOSIS — Z20822 Contact with and (suspected) exposure to covid-19: Secondary | ICD-10-CM

## 2019-01-13 ENCOUNTER — Ambulatory Visit (INDEPENDENT_AMBULATORY_CARE_PROVIDER_SITE_OTHER): Payer: BC Managed Care – PPO | Admitting: Internal Medicine

## 2019-01-13 ENCOUNTER — Encounter: Payer: Self-pay | Admitting: Internal Medicine

## 2019-01-13 ENCOUNTER — Other Ambulatory Visit: Payer: Self-pay

## 2019-01-13 VITALS — Temp 99.9°F

## 2019-01-13 DIAGNOSIS — U071 COVID-19: Secondary | ICD-10-CM | POA: Diagnosis not present

## 2019-01-13 LAB — NOVEL CORONAVIRUS, NAA: SARS-CoV-2, NAA: DETECTED — AB

## 2019-01-13 NOTE — Progress Notes (Signed)
Virtual Visit via Video Note  I connected with Sheri Hood on 01/13/19 at 11:30 AM EST by a video enabled telemedicine application and verified that I am speaking with the correct person using two identifiers.  Location: Patient: Home Provider: Office   I discussed the limitations of evaluation and management by telemedicine and the availability of in person appointments. The patient expressed understanding and agreed to proceed.  History of Present Illness:  Pt reports being diagnosed with COVID 19 on 11/10. She reports nasal congestion, cough and chest tightness. She is blowing clear mucous out of her nose. The cough is non productive. She denies headache, runny nose, ear pain, sore throat or shortness of breath. She has been running fever, had chills and body aches. She has tried Zinc, Vit C and Tylenol with some relief. She has not had any known positive COVID contacts but reports 2 people at her office were recently sick, that did not get tested for COVID. She also want to know if I could fill out STD forms to cover 11/10-11/24.  Past Medical History:  Diagnosis Date  . Allergy   . Anemia    s/p ablation for heavy bleeding   . Anxiety   . Arthritis    arhtritis middle back, scolosis  . Asthma   . Depression   . Diabetes mellitus without complication ([place])   . GERD (gastroesophageal reflux disease)   . Headache(784.0)    h/x migraines  . Heart murmur    recent diagnosed- / ehho 12/30/10 report on chart  . Hiatal hernia   . Hyperlipidemia   . Hypertension   . Recurrent upper respiratory infection (URI)    occ sore throat and slightly stuffy- no fever or cough  . Sleep apnea    On CPAP  . Sleep apnea     Current Outpatient Medications  Medication Sig Dispense Refill  . cetirizine (ZYRTEC) 10 MG tablet Take 10 mg by mouth daily.    . citalopram (CELEXA) 40 MG tablet TAKE 1 TABLET BY MOUTH ONCE DAILY 90 tablet 3  . Continuous Blood Gluc Receiver (FREESTYLE LIBRE 14  DAY READER) DEVI 1 Device by Does not apply route every morning. 1 each 2  . Continuous Blood Gluc Sensor (FREESTYLE LIBRE 14 DAY SENSOR) MISC 1 Device by Does not apply route every 14 (fourteen) days. 6 each 3  . diclofenac (VOLTAREN) 75 MG EC tablet TAKE 1 TABLET BY MOUTH TWICE DAILY (Patient taking differently: Take 75 mg by mouth 2 (two) times daily as needed. ) 60 tablet 4  . doxycycline (VIBRAMYCIN) 100 MG capsule TAKE 1 CAPSULE BY MOUTH TWICE DAILY WITH FOOD AND PLENTY OF DRINK    . glipiZIDE (GLUCOTROL) 10 MG tablet Take 1 tablet (10 mg total) by mouth 2 (two) times daily before a meal. 180 tablet 0  . glucose blood (BAYER CONTOUR NEXT TEST) test strip Use as instructed to test blood sugar once daily. E11.9 100 each 12  . hydrochlorothiazide (HYDRODIURIL) 25 MG tablet TAKE 1 TABLET BY MOUTH IN THE MORNING 90 tablet 3  . ibuprofen (ADVIL,MOTRIN) 200 MG tablet Take 200 mg by mouth every 6 (six) hours as needed. For pain    . Insulin Glargine (LANTUS SOLOSTAR) 100 UNIT/ML Solostar Pen Inject 34 Units into the skin at bedtime. 10 pen 1  . Insulin Pen Needle (B-D UF III MINI PEN NEEDLES) 31G X 5 MM MISC 1 each by Does not apply route daily. 100 each 1  . Microlet  Lancets MISC USE AS INSTRUCTED TO TEST BLOOD SUGAR ONCE DAILY 100 each 0  . omeprazole (PRILOSEC) 20 MG capsule Take 20 mg by mouth daily.    . rosuvastatin (CRESTOR) 10 MG tablet Take 1 tablet by mouth once daily 90 tablet 1  . vitamin E 400 UNIT capsule Take 800 Units by mouth at bedtime.      No current facility-administered medications for this visit.     Allergies  Allergen Reactions  . Benadryl [Diphenhydramine Hcl] Other (See Comments)    Keeps awake, jittery  . Codeine Other (See Comments)    Keeps awake  . Dilaudid [Hydromorphone Hcl] Itching and Nausea Only  . Morphine And Related Itching and Nausea Only    Family History  Problem Relation Age of Onset  . Diabetes Father   . Breast cancer Neg Hx   . Colon cancer  Neg Hx   . Esophageal cancer Neg Hx   . Rectal cancer Neg Hx   . Stomach cancer Neg Hx     Social History   Socioeconomic History  . Marital status: Married    Spouse name: Not on file  . Number of children: Not on file  . Years of education: Not on file  . Highest education level: Not on file  Occupational History  . Not on file  Social Needs  . Financial resource strain: Not on file  . Food insecurity    Worry: Not on file    Inability: Not on file  . Transportation needs    Medical: Not on file    Non-medical: Not on file  Tobacco Use  . Smoking status: Former Smoker    Packs/day: 0.50    Years: 4.00    Pack years: 2.00    Types: Cigarettes    Quit date: 01/02/1988    Years since quitting: 31.0  . Smokeless tobacco: Never Used  Substance and Sexual Activity  . Alcohol use: Yes    Alcohol/week: 0.0 standard drinks    Comment: Occasional Drink  . Drug use: No  . Sexual activity: Never  Lifestyle  . Physical activity    Days per week: Not on file    Minutes per session: Not on file  . Stress: Not on file  Relationships  . Social Herbalist on phone: Not on file    Gets together: Not on file    Attends religious service: Not on file    Active member of club or organization: Not on file    Attends meetings of clubs or organizations: Not on file    Relationship status: Not on file  . Intimate partner violence    Fear of current or ex partner: Not on file    Emotionally abused: Not on file    Physically abused: Not on file    Forced sexual activity: Not on file  Other Topics Concern  . Not on file  Social History Narrative  . Not on file     Constitutional: Pt reports fever, malaise. Denies fatigue, headache or abrupt weight changes.  HEENT: Pt reports nasal congestion. Denies eye pain, eye redness, ear pain, ringing in the ears, wax buildup, runny nose, bloody nose, or sore throat. Respiratory: Pt reports cough. Denies difficulty breathing,  shortness of breath, or sputum production.   Cardiovascular: Pt reports chest tightness. Denies chest pain, palpitations or swelling in the hands or feet.  Gastrointestinal: Denies abdominal pain, bloating, constipation, diarrhea or blood in the stool.  No other specific complaints in a complete review of systems (except as listed in HPI above).  Observations/Objective:  Temp 99.9 F (37.7 C) (Oral)  Wt Readings from Last 3 Encounters:  11/24/18 215 lb (97.5 kg)  05/19/18 212 lb (96.2 kg)  05/04/18 212 lb 9.6 oz (96.4 kg)    General: Appears her stated age, obese, ill appearing, but in NAD. HEENT: Nose: sounds congested. Pulmonary/Chest: Normal effort with intermittent coughing noted. No respiratory distress.  Neurological: Alert and oriented.    BMET    Component Value Date/Time   NA 139 07/27/2018 0911   K 3.9 07/27/2018 0911   CL 98 07/27/2018 0911   CO2 25 07/27/2018 0911   GLUCOSE 281 (H) 07/27/2018 0911   BUN 12 07/27/2018 0911   CREATININE 0.85 07/27/2018 0911   CALCIUM 9.9 07/27/2018 0911   GFRNONAA 80 07/27/2018 0911   GFRAA 92 07/27/2018 0911    Lipid Panel     Component Value Date/Time   CHOL 138 07/27/2018 0911   TRIG 121 07/27/2018 0911   HDL 40 07/27/2018 0911   CHOLHDL 3.5 07/27/2018 0911   LDLCALC 74 07/27/2018 0911    CBC    Component Value Date/Time   WBC 4.3 07/27/2018 0911   RBC 5.30 (H) 07/27/2018 0911   HGB 14.7 07/27/2018 0911   HCT 44.5 07/27/2018 0911   PLT 161 07/27/2018 0911   MCV 84 07/27/2018 0911   MCH 27.7 07/27/2018 0911   MCHC 33.0 07/27/2018 0911   RDW 12.6 07/27/2018 0911   LYMPHSABS 1.7 01/12/2017 1324   EOSABS 0.1 01/12/2017 1324   BASOSABS 0.0 01/12/2017 1324    Hgb A1C Lab Results  Component Value Date   HGBA1C 9.2 (A) 11/24/2018       Assessment and Plan:  COVID 19:  Encouraged rest, fluids Discussed self quarantine, masking, frequent handwashing and social distancing even in the home Recommend  Tylenol for fever and body aches Recommend Zyrtec/Mucinex and Flonase for URI symptoms Will hold off on Prednisone at this point Already on Doxycycline- finish course Recommend Coricidin HBP as needed for cough Continue inhaler Will fill out STD paperwork once received- fax number given  Follow Up Instructions:    I discussed the assessment and treatment plan with the patient. The patient was provided an opportunity to ask questions and all were answered. The patient agreed with the plan and demonstrated an understanding of the instructions.   The patient was advised to call back or seek an in-person evaluation if the symptoms worsen or if the condition fails to improve as anticipated.     Webb Silversmith, NP

## 2019-01-13 NOTE — Patient Instructions (Signed)

## 2019-01-13 NOTE — Progress Notes (Signed)
Acute Office Visit  Subjective:    Patient ID: Sheri Hood, female    DOB: [birth year], 53 y.o.   MRN: [id number]  No chief complaint on file.   HPI Patient is in today for cough, fever, body aches and nasal congestion. She mentions this has started since Monday. She describes her cough as dry without sputum production. She has tried delsym for her cough but she feels that her cough has been getting worse. Her fever started late Monday and on Tuesday her temp was 101.0. Yesterday, her temp was 99.9 and she has been taking Tylenol 1000 mg every 4 hrs. She reports generalized body aches that seem to have improved. She reports nasal congestion with clear discharge. She mentions she is on an antibiotic which has helped. She reports being near two persons from work that were sick with URI type symptoms. She has a history of DM2 and HTN.    Past Medical History:  Diagnosis Date  . Allergy   . Anemia    s/p ablation for heavy bleeding   . Anxiety   . Arthritis    arhtritis middle back, scolosis  . Asthma   . Depression   . Diabetes mellitus without complication ([place])   . GERD (gastroesophageal reflux disease)   . Headache(784.0)    h/x migraines  . Heart murmur    recent diagnosed- / ehho 12/30/10 report on chart  . Hiatal hernia   . Hyperlipidemia   . Hypertension   . Recurrent upper respiratory infection (URI)    occ sore throat and slightly stuffy- no fever or cough  . Sleep apnea    On CPAP  . Sleep apnea     Past Surgical History:  Procedure Laterality Date  . CHOLECYSTECTOMY    . ENDOMETRIAL ABLATION  2008  . FRACTURE SURGERY    . HARDWARE REMOVAL  01/08/2011   Procedure: HARDWARE REMOVAL;  Surgeon: Laurice Record A[place];  Location: [facility];  Service: Orthopedics;  Laterality: Left;  screw and plate left ankle and Fusion of Subtalor Joint Left Ankle    (Large c-arm)   . TONSILLECTOMY      Family History  Problem Relation Age of Onset  . Diabetes Father   . Breast  cancer Neg Hx   . Colon cancer Neg Hx   . Esophageal cancer Neg Hx   . Rectal cancer Neg Hx   . Stomach cancer Neg Hx     Social History   Socioeconomic History  . Marital status: Married    Spouse name: Not on file  . Number of children: Not on file  . Years of education: Not on file  . Highest education level: Not on file  Occupational History  . Not on file  Social Needs  . Financial resource strain: Not on file  . Food insecurity    Worry: Not on file    Inability: Not on file  . Transportation needs    Medical: Not on file    Non-medical: Not on file  Tobacco Use  . Smoking status: Former Smoker    Packs/day: 0.50    Years: 4.00    Pack years: 2.00    Types: Cigarettes    Quit date: 01/02/1988    Years since quitting: 31.0  . Smokeless tobacco: Never Used  Substance and Sexual Activity  . Alcohol use: Yes    Alcohol/week: 0.0 standard drinks    Comment: Occasional Drink  . Drug use: No  . Sexual  activity: Never  Lifestyle  . Physical activity    Days per week: Not on file    Minutes per session: Not on file  . Stress: Not on file  Relationships  . Social Herbalist on phone: Not on file    Gets together: Not on file    Attends religious service: Not on file    Active member of club or organization: Not on file    Attends meetings of clubs or organizations: Not on file    Relationship status: Not on file  . Intimate partner violence    Fear of current or ex partner: Not on file    Emotionally abused: Not on file    Physically abused: Not on file    Forced sexual activity: Not on file  Other Topics Concern  . Not on file  Social History Narrative  . Not on file    Outpatient Medications Prior to Visit  Medication Sig Dispense Refill  . cetirizine (ZYRTEC) 10 MG tablet Take 10 mg by mouth daily.    . citalopram (CELEXA) 40 MG tablet TAKE 1 TABLET BY MOUTH ONCE DAILY 90 tablet 3  . Continuous Blood Gluc Receiver (FREESTYLE LIBRE 14 DAY  READER) DEVI 1 Device by Does not apply route every morning. 1 each 2  . Continuous Blood Gluc Sensor (FREESTYLE LIBRE 14 DAY SENSOR) MISC 1 Device by Does not apply route every 14 (fourteen) days. 6 each 3  . diclofenac (VOLTAREN) 75 MG EC tablet TAKE 1 TABLET BY MOUTH TWICE DAILY (Patient taking differently: Take 75 mg by mouth 2 (two) times daily as needed. ) 60 tablet 4  . doxycycline (VIBRAMYCIN) 100 MG capsule TAKE 1 CAPSULE BY MOUTH TWICE DAILY WITH FOOD AND PLENTY OF DRINK    . glipiZIDE (GLUCOTROL) 10 MG tablet Take 1 tablet (10 mg total) by mouth 2 (two) times daily before a meal. 180 tablet 0  . glucose blood (BAYER CONTOUR NEXT TEST) test strip Use as instructed to test blood sugar once daily. E11.9 100 each 12  . hydrochlorothiazide (HYDRODIURIL) 25 MG tablet TAKE 1 TABLET BY MOUTH IN THE MORNING 90 tablet 3  . ibuprofen (ADVIL,MOTRIN) 200 MG tablet Take 200 mg by mouth every 6 (six) hours as needed. For pain    . Insulin Glargine (LANTUS SOLOSTAR) 100 UNIT/ML Solostar Pen Inject 34 Units into the skin at bedtime. 10 pen 1  . Insulin Pen Needle (B-D UF III MINI PEN NEEDLES) 31G X 5 MM MISC 1 each by Does not apply route daily. 100 each 1  . Microlet Lancets MISC USE AS INSTRUCTED TO TEST BLOOD SUGAR ONCE DAILY 100 each 0  . omeprazole (PRILOSEC) 20 MG capsule Take 20 mg by mouth daily.    . rosuvastatin (CRESTOR) 10 MG tablet Take 1 tablet by mouth once daily 90 tablet 1  . vitamin E 400 UNIT capsule Take 800 Units by mouth at bedtime.      No facility-administered medications prior to visit.     Allergies  Allergen Reactions  . Benadryl [Diphenhydramine Hcl] Other (See Comments)    Keeps awake, jittery  . Codeine Other (See Comments)    Keeps awake  . Dilaudid [Hydromorphone Hcl] Itching and Nausea Only  . Morphine And Related Itching and Nausea Only    ROS   Past Medical History:  Diagnosis Date  . Allergy   . Anemia    s/p ablation for heavy bleeding   . Anxiety    .  Arthritis    arhtritis middle back, scolosis  . Asthma   . Depression   . Diabetes mellitus without complication ([place])   . GERD (gastroesophageal reflux disease)   . Headache(784.0)    h/x migraines  . Heart murmur    recent diagnosed- / ehho 12/30/10 report on chart  . Hiatal hernia   . Hyperlipidemia   . Hypertension   . Recurrent upper respiratory infection (URI)    occ sore throat and slightly stuffy- no fever or cough  . Sleep apnea    On CPAP  . Sleep apnea     Current Outpatient Medications  Medication Sig Dispense Refill  . cetirizine (ZYRTEC) 10 MG tablet Take 10 mg by mouth daily.    . citalopram (CELEXA) 40 MG tablet TAKE 1 TABLET BY MOUTH ONCE DAILY 90 tablet 3  . Continuous Blood Gluc Receiver (FREESTYLE LIBRE 14 DAY READER) DEVI 1 Device by Does not apply route every morning. 1 each 2  . Continuous Blood Gluc Sensor (FREESTYLE LIBRE 14 DAY SENSOR) MISC 1 Device by Does not apply route every 14 (fourteen) days. 6 each 3  . diclofenac (VOLTAREN) 75 MG EC tablet TAKE 1 TABLET BY MOUTH TWICE DAILY (Patient taking differently: Take 75 mg by mouth 2 (two) times daily as needed. ) 60 tablet 4  . doxycycline (VIBRAMYCIN) 100 MG capsule TAKE 1 CAPSULE BY MOUTH TWICE DAILY WITH FOOD AND PLENTY OF DRINK    . glipiZIDE (GLUCOTROL) 10 MG tablet Take 1 tablet (10 mg total) by mouth 2 (two) times daily before a meal. 180 tablet 0  . glucose blood (BAYER CONTOUR NEXT TEST) test strip Use as instructed to test blood sugar once daily. E11.9 100 each 12  . hydrochlorothiazide (HYDRODIURIL) 25 MG tablet TAKE 1 TABLET BY MOUTH IN THE MORNING 90 tablet 3  . ibuprofen (ADVIL,MOTRIN) 200 MG tablet Take 200 mg by mouth every 6 (six) hours as needed. For pain    . Insulin Glargine (LANTUS SOLOSTAR) 100 UNIT/ML Solostar Pen Inject 34 Units into the skin at bedtime. 10 pen 1  . Insulin Pen Needle (B-D UF III MINI PEN NEEDLES) 31G X 5 MM MISC 1 each by Does not apply route daily. 100 each 1  .  Microlet Lancets MISC USE AS INSTRUCTED TO TEST BLOOD SUGAR ONCE DAILY 100 each 0  . omeprazole (PRILOSEC) 20 MG capsule Take 20 mg by mouth daily.    . rosuvastatin (CRESTOR) 10 MG tablet Take 1 tablet by mouth once daily 90 tablet 1  . vitamin E 400 UNIT capsule Take 800 Units by mouth at bedtime.      No current facility-administered medications for this visit.     Allergies  Allergen Reactions  . Benadryl [Diphenhydramine Hcl] Other (See Comments)    Keeps awake, jittery  . Codeine Other (See Comments)    Keeps awake  . Dilaudid [Hydromorphone Hcl] Itching and Nausea Only  . Morphine And Related Itching and Nausea Only    Family History  Problem Relation Age of Onset  . Diabetes Father   . Breast cancer Neg Hx   . Colon cancer Neg Hx   . Esophageal cancer Neg Hx   . Rectal cancer Neg Hx   . Stomach cancer Neg Hx     Social History   Socioeconomic History  . Marital status: Married    Spouse name: Not on file  . Number of children: Not on file  . Years of education: Not on  file  . Highest education level: Not on file  Occupational History  . Not on file  Social Needs  . Financial resource strain: Not on file  . Food insecurity    Worry: Not on file    Inability: Not on file  . Transportation needs    Medical: Not on file    Non-medical: Not on file  Tobacco Use  . Smoking status: Former Smoker    Packs/day: 0.50    Years: 4.00    Pack years: 2.00    Types: Cigarettes    Quit date: 01/02/1988    Years since quitting: 31.0  . Smokeless tobacco: Never Used  Substance and Sexual Activity  . Alcohol use: Yes    Alcohol/week: 0.0 standard drinks    Comment: Occasional Drink  . Drug use: No  . Sexual activity: Never  Lifestyle  . Physical activity    Days per week: Not on file    Minutes per session: Not on file  . Stress: Not on file  Relationships  . Social Herbalist on phone: Not on file    Gets together: Not on file    Attends religious  service: Not on file    Active member of club or organization: Not on file    Attends meetings of clubs or organizations: Not on file    Relationship status: Not on file  . Intimate partner violence    Fear of current or ex partner: Not on file    Emotionally abused: Not on file    Physically abused: Not on file    Forced sexual activity: Not on file  Other Topics Concern  . Not on file  Social History Narrative  . Not on file     Constitutional: Reports fever and malaise. Denies headache or abrupt weight changes.  HEENT: Reports clear nasal discharge.  Respiratory: Reports dry cough. Denies difficulty breathing, shortness of breath sputum production.   Cardiovascular: Denies chest pain, chest tightness, palpitations or swelling in the hands or feet.  Gastrointestinal: Denies abdominal pain, bloating, constipation, diarrhea or blood in the stool.  GU: Denies urgency, frequency, pain with urination, burning sensation, blood in urine, odor or discharge. Musculoskeletal: Reports generalized body aches. Denies decrease in range of motion, difficulty with gait, muscle pain or joint pain and swelling.   No other specific complaints in a complete review of systems (except as listed in HPI above).     Objective:    Physical Exam Temp 99.9 F (37.7 C) (Oral)  Wt Readings from Last 3 Encounters:  11/24/18 97.5 kg  05/19/18 96.2 kg  05/04/18 96.4 kg    General: Appears her stated age, well developed, obese HEENT: Sounds congested during conversation. Head: normal shape and size .  Pulmonary/Chest: Coughs several times through conversation. Normal effort and no respiratory distress.  EKG:  BMET    Component Value Date/Time   NA 139 07/27/2018 0911   K 3.9 07/27/2018 0911   CL 98 07/27/2018 0911   CO2 25 07/27/2018 0911   GLUCOSE 281 (H) 07/27/2018 0911   BUN 12 07/27/2018 0911   CREATININE 0.85 07/27/2018 0911   CALCIUM 9.9 07/27/2018 0911   GFRNONAA 80 07/27/2018 0911    GFRAA 92 07/27/2018 0911    Lipid Panel     Component Value Date/Time   CHOL 138 07/27/2018 0911   TRIG 121 07/27/2018 0911   HDL 40 07/27/2018 0911   CHOLHDL 3.5 07/27/2018 0911   LDLCALC  74 07/27/2018 0911    CBC    Component Value Date/Time   WBC 4.3 07/27/2018 0911   RBC 5.30 (H) 07/27/2018 0911   HGB 14.7 07/27/2018 0911   HCT 44.5 07/27/2018 0911   PLT 161 07/27/2018 0911   MCV 84 07/27/2018 0911   MCH 27.7 07/27/2018 0911   MCHC 33.0 07/27/2018 0911   RDW 12.6 07/27/2018 0911   LYMPHSABS 1.7 01/12/2017 1324   EOSABS 0.1 01/12/2017 1324   BASOSABS 0.0 01/12/2017 1324    Hgb A1C Lab Results  Component Value Date   HGBA1C 9.2 (A) 11/24/2018      There were no vitals taken for this visit. Wt Readings from Last 3 Encounters:  11/24/18 97.5 kg  05/19/18 96.2 kg  05/04/18 96.4 kg    Health Maintenance Due  Topic Date Due  . INFLUENZA VACCINE  10/02/2018  . FOOT EXAM  10/02/2018  . URINE MICROALBUMIN  02/13/2019    There are no preventive care reminders to display for this patient.   Lab Results  Component Value Date   TSH 1.000 01/12/2017   Lab Results  Component Value Date   WBC 4.3 07/27/2018   HGB 14.7 07/27/2018   HCT 44.5 07/27/2018   MCV 84 07/27/2018   PLT 161 07/27/2018   Lab Results  Component Value Date   NA 139 07/27/2018   K 3.9 07/27/2018   CO2 25 07/27/2018   GLUCOSE 281 (H) 07/27/2018   BUN 12 07/27/2018   CREATININE 0.85 07/27/2018   BILITOT 0.7 07/27/2018   ALKPHOS 81 07/27/2018   AST 50 (H) 07/27/2018   ALT 54 (H) 07/27/2018   PROT 6.9 07/27/2018   ALBUMIN 4.7 07/27/2018   CALCIUM 9.9 07/27/2018   Lab Results  Component Value Date   CHOL 138 07/27/2018   Lab Results  Component Value Date   HDL 40 07/27/2018   Lab Results  Component Value Date   LDLCALC 74 07/27/2018   Lab Results  Component Value Date   TRIG 121 07/27/2018   Lab Results  Component Value Date   CHOLHDL 3.5 07/27/2018   Lab Results   Component Value Date   HGBA1C 9.2 (A) 11/24/2018       Assessment & Plan:  Cough, fever, body aches, nasal congestion:  Cough:  OTC Coricidin HBV for cough suppression  Fever:  Cut back on Tylenol to 400mg  every 4hrs or 500 mg every 8 hrs for pain/fever  Nasal congestion: OTC Flonase for congestion OTC Mucinex 600 mg every 12 hrs Continue antibiotic  Continue symptomatic treatment: rest and plenty of fluids Recommendations for being out of work provided Problem List Items Addressed This Visit    None       No orders of the defined types were placed in this encounter.    Leward Quan, Student-PA

## 2019-01-14 ENCOUNTER — Encounter: Payer: Self-pay | Admitting: Internal Medicine

## 2019-01-14 ENCOUNTER — Telehealth: Payer: Self-pay | Admitting: Internal Medicine

## 2019-01-14 NOTE — Telephone Encounter (Signed)
Attending provider statement in [facility]'s in box for review and signature

## 2019-01-14 NOTE — Telephone Encounter (Signed)
Done, given back to Robin 

## 2019-01-14 NOTE — Telephone Encounter (Signed)
Spoke to pt and she is aware as instructed ppw received andfilled out

## 2019-01-14 NOTE — Telephone Encounter (Signed)
Paperwork faxed °

## 2019-01-18 ENCOUNTER — Telehealth: Payer: Self-pay

## 2019-01-18 ENCOUNTER — Encounter: Payer: Self-pay | Admitting: Internal Medicine

## 2019-01-18 ENCOUNTER — Other Ambulatory Visit: Payer: Self-pay | Admitting: Family Medicine

## 2019-01-18 NOTE — Telephone Encounter (Addendum)
   Are you short of breath today?  Denies shortness of breath Are you having a cough today?  Dry cough Are you experiencing weakness today? Fatigue  Are you vomiting? Some nausea, intermittent vomiting How is your appetite compared to yesterday? No changes, still no appetite  Are you experiencing diarrhea? Watery stools   If you have a thermometer, what is your highest temperature (in Fahrenheit) since last recording?   99.9 this morning  Expressed to pt how important it is to drink plenty of water minimal 6-8 cups daily as she will be at risk for having to got to ED due to dehydration  Also told pt it is important for her to get up and walk some movement/activity while in isolation  Pt is not taking Tylenol every 8 hours due to history of elevated liver enzymes  Did advise pt if she experience higher fever, SOB, Dehydration Sx to go to ED

## 2019-01-18 NOTE — Telephone Encounter (Signed)
Pt aware  °Copy for pt °Copy for scan °

## 2019-01-19 NOTE — Telephone Encounter (Signed)
Agree with advice given

## 2019-01-20 ENCOUNTER — Encounter: Payer: Self-pay | Admitting: Internal Medicine

## 2019-01-20 MED ORDER — HYDROCHLOROTHIAZIDE 25 MG PO TABS: 25.0000 mg | ORAL_TABLET | Freq: Every morning | ORAL | 0 refills | Status: DC

## 2019-01-21 ENCOUNTER — Encounter: Payer: Self-pay | Admitting: Internal Medicine

## 2019-01-21 MED ORDER — HYDROCHLOROTHIAZIDE 25 MG PO TABS: 25.0000 mg | ORAL_TABLET | Freq: Every morning | ORAL | 0 refills | Status: DC

## 2019-01-21 NOTE — Addendum Note (Signed)
Addended by: Lurlean Nanny on: 01/21/2019 08:51 AM   Modules accepted: Orders

## 2019-01-22 ENCOUNTER — Encounter: Payer: Self-pay | Admitting: Internal Medicine

## 2019-01-23 MED ORDER — PROMETHAZINE-DM 6.25-15 MG/5ML PO SYRP: 5.0000 mL | ORAL_SOLUTION | Freq: Four times a day (QID) | ORAL | 0 refills | Status: DC | PRN

## 2019-01-24 ENCOUNTER — Encounter: Payer: Self-pay | Admitting: Internal Medicine

## 2019-01-24 MED ORDER — PREDNISONE 10 MG PO TABS: ORAL_TABLET | ORAL | 0 refills | Status: DC

## 2019-01-26 ENCOUNTER — Encounter: Payer: Self-pay | Admitting: Internal Medicine

## 2019-01-28 ENCOUNTER — Encounter: Payer: Self-pay | Admitting: Internal Medicine

## 2019-01-28 MED ORDER — CITALOPRAM HYDROBROMIDE 40 MG PO TABS: 40.0000 mg | ORAL_TABLET | Freq: Every day | ORAL | 1 refills | Status: DC

## 2019-01-28 MED ORDER — LANTUS SOLOSTAR 100 UNIT/ML ~~LOC~~ SOPN: 34.0000 [IU] | PEN_INJECTOR | Freq: Every day | SUBCUTANEOUS | 1 refills | Status: DC

## 2019-01-28 MED ORDER — BD PEN NEEDLE MINI U/F 31G X 5 MM MISC: 1.0000 | Freq: Every day | 1 refills | Status: DC

## 2019-01-29 ENCOUNTER — Encounter: Payer: Self-pay | Admitting: Internal Medicine

## 2019-01-29 MED ORDER — BD PEN NEEDLE MINI U/F 31G X 5 MM MISC: 1.0000 | Freq: Every day | 1 refills | Status: DC

## 2019-01-29 MED ORDER — LANTUS SOLOSTAR 100 UNIT/ML ~~LOC~~ SOPN: 34.0000 [IU] | PEN_INJECTOR | Freq: Every day | SUBCUTANEOUS | 1 refills | Status: DC

## 2019-01-31 ENCOUNTER — Telehealth: Payer: Self-pay

## 2019-01-31 ENCOUNTER — Other Ambulatory Visit: Payer: Self-pay

## 2019-01-31 ENCOUNTER — Ambulatory Visit (INDEPENDENT_AMBULATORY_CARE_PROVIDER_SITE_OTHER): Payer: BC Managed Care – PPO | Admitting: Internal Medicine

## 2019-01-31 ENCOUNTER — Encounter: Payer: Self-pay | Admitting: Internal Medicine

## 2019-01-31 VITALS — Temp 98.7°F

## 2019-01-31 DIAGNOSIS — J029 Acute pharyngitis, unspecified: Secondary | ICD-10-CM

## 2019-01-31 DIAGNOSIS — U071 COVID-19: Secondary | ICD-10-CM | POA: Diagnosis not present

## 2019-01-31 DIAGNOSIS — R05 Cough: Secondary | ICD-10-CM

## 2019-01-31 DIAGNOSIS — R5383 Other fatigue: Secondary | ICD-10-CM

## 2019-01-31 DIAGNOSIS — R0982 Postnasal drip: Secondary | ICD-10-CM

## 2019-01-31 DIAGNOSIS — R195 Other fecal abnormalities: Secondary | ICD-10-CM

## 2019-01-31 DIAGNOSIS — R059 Cough, unspecified: Secondary | ICD-10-CM

## 2019-01-31 MED ORDER — ALBUTEROL SULFATE HFA 108 (90 BASE) MCG/ACT IN AERS: 1.0000 | INHALATION_SPRAY | Freq: Four times a day (QID) | RESPIRATORY_TRACT | 0 refills | Status: DC | PRN

## 2019-01-31 MED ORDER — AZITHROMYCIN 250 MG PO TABS: ORAL_TABLET | ORAL | 0 refills | Status: DC

## 2019-01-31 MED ORDER — FLOVENT HFA 44 MCG/ACT IN AERO: 2.0000 | INHALATION_SPRAY | Freq: Two times a day (BID) | RESPIRATORY_TRACT | 0 refills | Status: DC

## 2019-01-31 NOTE — Progress Notes (Signed)
Virtual Visit via Video Note  I connected with Roland Earl on 01/31/19 at 10:00 AM EST by a video enabled telemedicine application and verified that I am speaking with the correct person using two identifiers.  Location: Patient: Home Provider: Office   I discussed the limitations of evaluation and management by telemedicine and the availability of in person appointments. The patient expressed understanding and agreed to proceed.  History of Present Illness:  Pt needing to follow up on COVID 19, to see if she is able to go back to work. Her current symptoms are sore throat,  cough, chest tightness, loose stools, and extreme fatigue. She is having some difficulty swallowing. She reports her cough is mostly dry and non productive. She reports some PND. Patient was tested for COVID and was positive on 01/13/2019. She is currently taking Mucinex, Vit C, Zinc, Tylenol, Albuterol inhaler and Promethazine-DM with minimal relief. She reports she has finished her course of Prednisone yesterday also with minor relief. She denies chest pain, fevers or body aches at this time. She reports concern for having a pneumonia. She is scheduled to go back to work tomorrow.   Past Medical History:  Diagnosis Date  . Allergy   . Anemia    s/p ablation for heavy bleeding   . Anxiety   . Arthritis    arhtritis middle back, scolosis  . Asthma   . Depression   . Diabetes mellitus without complication ([place])   . GERD (gastroesophageal reflux disease)   . Headache(784.0)    h/x migraines  . Heart murmur    recent diagnosed- / ehho 12/30/10 report on chart  . Hiatal hernia   . Hyperlipidemia   . Hypertension   . Recurrent upper respiratory infection (URI)    occ sore throat and slightly stuffy- no fever or cough  . Sleep apnea    On CPAP  . Sleep apnea     Current Outpatient Medications  Medication Sig Dispense Refill  . cetirizine (ZYRTEC) 10 MG tablet Take 10 mg by mouth daily.    . citalopram  (CELEXA) 40 MG tablet Take 1 tablet (40 mg total) by mouth daily. 90 tablet 1  . Continuous Blood Gluc Receiver (FREESTYLE LIBRE 14 DAY READER) DEVI 1 Device by Does not apply route every morning. 1 each 2  . Continuous Blood Gluc Sensor (FREESTYLE LIBRE 14 DAY SENSOR) MISC 1 Device by Does not apply route every 14 (fourteen) days. 6 each 3  . diclofenac (VOLTAREN) 75 MG EC tablet TAKE 1 TABLET BY MOUTH TWICE DAILY (Patient taking differently: Take 75 mg by mouth 2 (two) times daily as needed. ) 60 tablet 4  . doxycycline (VIBRAMYCIN) 100 MG capsule TAKE 1 CAPSULE BY MOUTH TWICE DAILY WITH FOOD AND PLENTY OF DRINK    . glipiZIDE (GLUCOTROL) 10 MG tablet Take 1 tablet (10 mg total) by mouth 2 (two) times daily before a meal. 180 tablet 0  . glucose blood (BAYER CONTOUR NEXT TEST) test strip Use as instructed to test blood sugar once daily. E11.9 100 each 12  . hydrochlorothiazide (HYDRODIURIL) 25 MG tablet Take 1 tablet (25 mg total) by mouth every morning. 90 tablet 0  . hydrochlorothiazide (HYDRODIURIL) 25 MG tablet Take 1 tablet (25 mg total) by mouth every morning. 90 tablet 0  . ibuprofen (ADVIL,MOTRIN) 200 MG tablet Take 200 mg by mouth every 6 (six) hours as needed. For pain    . Insulin Glargine (LANTUS SOLOSTAR) 100 UNIT/ML Solostar Pen Inject  34 Units into the skin at bedtime. 10 pen 1  . Insulin Pen Needle (B-D UF III MINI PEN NEEDLES) 31G X 5 MM MISC 1 each by Does not apply route daily. 100 each 1  . Microlet Lancets MISC USE AS INSTRUCTED TO TEST BLOOD SUGAR ONCE DAILY 100 each 0  . omeprazole (PRILOSEC) 20 MG capsule Take 20 mg by mouth daily.    . predniSONE (DELTASONE) 10 MG tablet Take 6 tabs day 1, 5 tabs day 2, 4 tabs day 3, 3 tabs day 4, 2 tabs day 5, 1 tab day 6 21 tablet 0  . promethazine-dextromethorphan (PROMETHAZINE-DM) 6.25-15 MG/5ML syrup Take 5 mLs by mouth 4 (four) times daily as needed. 118 mL 0  . rosuvastatin (CRESTOR) 10 MG tablet Take 1 tablet by mouth once daily 90  tablet 1  . vitamin E 400 UNIT capsule Take 800 Units by mouth at bedtime.      No current facility-administered medications for this visit.     Allergies  Allergen Reactions  . Benadryl [Diphenhydramine Hcl] Other (See Comments)    Keeps awake, jittery  . Codeine Other (See Comments)    Keeps awake  . Dilaudid [Hydromorphone Hcl] Itching and Nausea Only  . Morphine And Related Itching and Nausea Only    Family History  Problem Relation Age of Onset  . Diabetes Father   . Breast cancer Neg Hx   . Colon cancer Neg Hx   . Esophageal cancer Neg Hx   . Rectal cancer Neg Hx   . Stomach cancer Neg Hx     Social History   Socioeconomic History  . Marital status: Married    Spouse name: Not on file  . Number of children: Not on file  . Years of education: Not on file  . Highest education level: Not on file  Occupational History  . Not on file  Social Needs  . Financial resource strain: Not on file  . Food insecurity    Worry: Not on file    Inability: Not on file  . Transportation needs    Medical: Not on file    Non-medical: Not on file  Tobacco Use  . Smoking status: Former Smoker    Packs/day: 0.50    Years: 4.00    Pack years: 2.00    Types: Cigarettes    Quit date: 01/02/1988    Years since quitting: 31.1  . Smokeless tobacco: Never Used  Substance and Sexual Activity  . Alcohol use: Yes    Alcohol/week: 0.0 standard drinks    Comment: Occasional Drink  . Drug use: No  . Sexual activity: Never  Lifestyle  . Physical activity    Days per week: Not on file    Minutes per session: Not on file  . Stress: Not on file  Relationships  . Social Herbalist on phone: Not on file    Gets together: Not on file    Attends religious service: Not on file    Active member of club or organization: Not on file    Attends meetings of clubs or organizations: Not on file    Relationship status: Not on file  . Intimate partner violence    Fear of current or ex  partner: Not on file    Emotionally abused: Not on file    Physically abused: Not on file    Forced sexual activity: Not on file  Other Topics Concern  . Not on file  Social History Narrative  . Not on file     Constitutional: Pt reports malaise and fatigue. Denies fever, headache or abrupt weight changes.  HEENT: Pt reports sore throat and PND. Denies eye pain, eye redness, ear pain, ringing in the ears, wax buildup, runny nose, nasal congestion, bloody nose. Respiratory: Pt reports cough. Denies difficulty breathing or shortness of breath,  Cardiovascular: Denies chest pain, chest tightness, palpitations or swelling in the hands or feet.  Gastrointestinal: Pt reports loose stools. Denies abdominal pain, bloating, constipation or blood in the stool.   No other specific complaints in a complete review of systems (except as listed in HPI above).    Observations/Objective:  Temp 98.7 F (37.1 C) (Axillary)   Wt Readings from Last 3 Encounters:  11/24/18 215 lb (97.5 kg)  05/19/18 212 lb (96.2 kg)  05/04/18 212 lb 9.6 oz (96.4 kg)    General: Appears ill but in NAD HEENT: Sounds congested during conversation.  Pulmonary/Chest: Dry coughing throughout conversation. Increased work of breathing due to coughing.     BMET    Component Value Date/Time   NA 139 07/27/2018 0911   K 3.9 07/27/2018 0911   CL 98 07/27/2018 0911   CO2 25 07/27/2018 0911   GLUCOSE 281 (H) 07/27/2018 0911   BUN 12 07/27/2018 0911   CREATININE 0.85 07/27/2018 0911   CALCIUM 9.9 07/27/2018 0911   GFRNONAA 80 07/27/2018 0911   GFRAA 92 07/27/2018 0911    Lipid Panel     Component Value Date/Time   CHOL 138 07/27/2018 0911   TRIG 121 07/27/2018 0911   HDL 40 07/27/2018 0911   CHOLHDL 3.5 07/27/2018 0911   LDLCALC 74 07/27/2018 0911    CBC    Component Value Date/Time   WBC 4.3 07/27/2018 0911   RBC 5.30 (H) 07/27/2018 0911   HGB 14.7 07/27/2018 0911   HCT 44.5 07/27/2018 0911   PLT 161  07/27/2018 0911   MCV 84 07/27/2018 0911   MCH 27.7 07/27/2018 0911   MCHC 33.0 07/27/2018 0911   RDW 12.6 07/27/2018 0911   LYMPHSABS 1.7 01/12/2017 1324   EOSABS 0.1 01/12/2017 1324   BASOSABS 0.0 01/12/2017 1324    Hgb A1C Lab Results  Component Value Date   HGBA1C 9.2 (A) 11/24/2018        Assessment and Plan:  COVID 19:  RX for Flovent inhaler BID Albuterol refilled today Start Zyrtec OTC Nasal saline for congestion RX for Azithromycin for possible COVID-associated PNA Work note provided to stay out of work for 1 week  ED precautions given  Follow Up Instructions:    I discussed the assessment and treatment plan with the patient. The patient was provided an opportunity to ask questions and all were answered. The patient agreed with the plan and demonstrated an understanding of the instructions.   The patient was advised to call back or seek an in-person evaluation if the symptoms worsen or if the condition fails to improve as anticipated.     Webb Silversmith, NP

## 2019-01-31 NOTE — Telephone Encounter (Signed)
Patient contacted the office asking about her FMLA paperwork. Patient states she needs to discuss the dates of her being out of work, and needs Shirlean Mylar to return her phone call. Thanks!

## 2019-01-31 NOTE — Patient Instructions (Signed)
This information is directly available on the CDC website: https://www.cdc.gov/coronavirus/2019-ncov/if-you-are-sick/steps-when-sick.html    Source:CDC Reference to specific commercial products, manufacturers, companies, or trademarks does not constitute its endorsement or recommendation by the U.S. Government, Department of Health and Human Services, or Centers for Disease Control and Prevention.  

## 2019-02-01 NOTE — Telephone Encounter (Signed)
Pt is aware that paperwork has been refaxed

## 2019-02-03 ENCOUNTER — Encounter: Payer: Self-pay | Admitting: Internal Medicine

## 2019-02-04 ENCOUNTER — Other Ambulatory Visit: Payer: Self-pay | Admitting: Internal Medicine

## 2019-02-04 ENCOUNTER — Telehealth: Payer: Self-pay

## 2019-02-04 NOTE — Telephone Encounter (Signed)
Can extend note for 1 more week if she would like, but would probably try to have her return on 12/14. Ok to refill insulin.

## 2019-02-04 NOTE — Telephone Encounter (Signed)
called pt's number and husbands number and error msg stated they were unavailable, invalid

## 2019-02-04 NOTE — Telephone Encounter (Signed)
Pt left v/m; pt wants to know what to do about work on 02/07/19. Pt is continually coughing while on v/m. Pt also request cb about getting insulin refilled. Pt left v/m at 1:44 PM. FYI to Avie Echevaria NP about work note and Melanie CMA about pt message she was talking to pt about insulin refill.

## 2019-02-08 ENCOUNTER — Telehealth: Payer: Self-pay | Admitting: Internal Medicine

## 2019-02-08 NOTE — Telephone Encounter (Signed)
[facility] faxed attending provider statement  Needed to be updated with pt returning to work on 02/15/2019

## 2019-02-08 NOTE — Telephone Encounter (Signed)
Done, given back to Robin 

## 2019-02-08 NOTE — Telephone Encounter (Signed)
Paperwork faxed °

## 2019-02-14 ENCOUNTER — Encounter: Payer: Self-pay | Admitting: Internal Medicine

## 2019-02-14 ENCOUNTER — Ambulatory Visit (INDEPENDENT_AMBULATORY_CARE_PROVIDER_SITE_OTHER): Payer: BC Managed Care – PPO | Admitting: Internal Medicine

## 2019-02-14 ENCOUNTER — Other Ambulatory Visit: Payer: Self-pay

## 2019-02-14 VITALS — BP 128/84 | HR 68 | Temp 97.9°F | Ht 63.25 in | Wt 208.0 lb

## 2019-02-14 DIAGNOSIS — Z0000 Encounter for general adult medical examination without abnormal findings: Secondary | ICD-10-CM

## 2019-02-14 DIAGNOSIS — I10 Essential (primary) hypertension: Secondary | ICD-10-CM

## 2019-02-14 DIAGNOSIS — M1990 Unspecified osteoarthritis, unspecified site: Secondary | ICD-10-CM

## 2019-02-14 DIAGNOSIS — Z0001 Encounter for general adult medical examination with abnormal findings: Secondary | ICD-10-CM

## 2019-02-14 DIAGNOSIS — F419 Anxiety disorder, unspecified: Secondary | ICD-10-CM | POA: Diagnosis not present

## 2019-02-14 DIAGNOSIS — F329 Major depressive disorder, single episode, unspecified: Secondary | ICD-10-CM

## 2019-02-14 DIAGNOSIS — G4733 Obstructive sleep apnea (adult) (pediatric): Secondary | ICD-10-CM

## 2019-02-14 DIAGNOSIS — F32A Depression, unspecified: Secondary | ICD-10-CM

## 2019-02-14 DIAGNOSIS — E7800 Pure hypercholesterolemia, unspecified: Secondary | ICD-10-CM

## 2019-02-14 DIAGNOSIS — E559 Vitamin D deficiency, unspecified: Secondary | ICD-10-CM

## 2019-02-14 DIAGNOSIS — K219 Gastro-esophageal reflux disease without esophagitis: Secondary | ICD-10-CM | POA: Diagnosis not present

## 2019-02-14 DIAGNOSIS — J4520 Mild intermittent asthma, uncomplicated: Secondary | ICD-10-CM

## 2019-02-14 DIAGNOSIS — E119 Type 2 diabetes mellitus without complications: Secondary | ICD-10-CM

## 2019-02-14 NOTE — Assessment & Plan Note (Signed)
Continue Omeprazole CBC and CMET today Try to avoid foods that trigger your reflux Weight loss could improve her symptoms

## 2019-02-14 NOTE — Assessment & Plan Note (Signed)
CMET and Lipid profile today Encouraged her to consume a low fat diet Continue Rosuvastatin for now

## 2019-02-14 NOTE — Assessment & Plan Note (Signed)
Stable on Citalopram Support offered today Will monitor

## 2019-02-14 NOTE — Assessment & Plan Note (Signed)
Continue Flovent and Albuterol Will monitor

## 2019-02-14 NOTE — Assessment & Plan Note (Signed)
Continue Diclofenac prn Encouraged regular physical activity and exercise for weight loss

## 2019-02-14 NOTE — Telephone Encounter (Signed)
Pt aware  °Copy for pt °Copy for scan °

## 2019-02-14 NOTE — Assessment & Plan Note (Signed)
Controlled on HCTZ BMET today Reinforced DASH diet and exercise for weight loss

## 2019-02-14 NOTE — Assessment & Plan Note (Signed)
Encouraged weight loss Continue CPAP 

## 2019-02-14 NOTE — Assessment & Plan Note (Signed)
A1C and urine microalbumin ordered Encouraged her to consume a low carb diet and exercise for weight loss Continue Lantus and Glipizide Encouraged her to monitor her sugars Encouraged routine foot exams Encouraged yearly eye exam Flu and pneumovax UTD

## 2019-02-14 NOTE — Patient Instructions (Signed)
Health Maintenance, Female Adopting a healthy lifestyle and getting preventive care are important in promoting health and wellness. Ask your health care provider about:  The right schedule for you to have regular tests and exams.  Things you can do on your own to prevent diseases and keep yourself healthy. What should I know about diet, weight, and exercise? Eat a healthy diet   Eat a diet that includes plenty of vegetables, fruits, low-fat dairy products, and lean protein.  Do not eat a lot of foods that are high in solid fats, added sugars, or sodium. Maintain a healthy weight Body mass index (BMI) is used to identify weight problems. It estimates body fat based on height and weight. Your health care provider can help determine your BMI and help you achieve or maintain a healthy weight. Get regular exercise Get regular exercise. This is one of the most important things you can do for your health. Most adults should:  Exercise for at least 150 minutes each week. The exercise should increase your heart rate and make you sweat (moderate-intensity exercise).  Do strengthening exercises at least twice a week. This is in addition to the moderate-intensity exercise.  Spend less time sitting. Even light physical activity can be beneficial. Watch cholesterol and blood lipids Have your blood tested for lipids and cholesterol at 51 years of age, then have this test every 5 years. Have your cholesterol levels checked more often if:  Your lipid or cholesterol levels are high.  You are older than 51 years of age.  You are at high risk for heart disease. What should I know about cancer screening? Depending on your health history and family history, you may need to have cancer screening at various ages. This may include screening for:  Breast cancer.  Cervical cancer.  Colorectal cancer.  Skin cancer.  Lung cancer. What should I know about heart disease, diabetes, and high blood  pressure? Blood pressure and heart disease  High blood pressure causes heart disease and increases the risk of stroke. This is more likely to develop in people who have high blood pressure readings, are of African descent, or are overweight.  Have your blood pressure checked: ? Every 3-5 years if you are 18-39 years of age. ? Every year if you are 40 years old or older. Diabetes Have regular diabetes screenings. This checks your fasting blood sugar level. Have the screening done:  Once every three years after age 40 if you are at a normal weight and have a low risk for diabetes.  More often and at a younger age if you are overweight or have a high risk for diabetes. What should I know about preventing infection? Hepatitis B If you have a higher risk for hepatitis B, you should be screened for this virus. Talk with your health care provider to find out if you are at risk for hepatitis B infection. Hepatitis C Testing is recommended for:  Everyone born from 1945 through 1965.  Anyone with known risk factors for hepatitis C. Sexually transmitted infections (STIs)  Get screened for STIs, including gonorrhea and chlamydia, if: ? You are sexually active and are younger than 51 years of age. ? You are older than 51 years of age and your health care provider tells you that you are at risk for this type of infection. ? Your sexual activity has changed since you were last screened, and you are at increased risk for chlamydia or gonorrhea. Ask your health care provider if   you are at risk.  Ask your health care provider about whether you are at high risk for HIV. Your health care provider may recommend a prescription medicine to help prevent HIV infection. If you choose to take medicine to prevent HIV, you should first get tested for HIV. You should then be tested every 3 months for as long as you are taking the medicine. Pregnancy  If you are about to stop having your period (premenopausal) and  you may become pregnant, seek counseling before you get pregnant.  Take 400 to 800 micrograms (mcg) of folic acid every day if you become pregnant.  Ask for birth control (contraception) if you want to prevent pregnancy. Osteoporosis and menopause Osteoporosis is a disease in which the bones lose minerals and strength with aging. This can result in bone fractures. If you are 65 years old or older, or if you are at risk for osteoporosis and fractures, ask your health care provider if you should:  Be screened for bone loss.  Take a calcium or vitamin D supplement to lower your risk of fractures.  Be given hormone replacement therapy (HRT) to treat symptoms of menopause. Follow these instructions at home: Lifestyle  Do not use any products that contain nicotine or tobacco, such as cigarettes, e-cigarettes, and chewing tobacco. If you need help quitting, ask your health care provider.  Do not use street drugs.  Do not share needles.  Ask your health care provider for help if you need support or information about quitting drugs. Alcohol use  Do not drink alcohol if: ? Your health care provider tells you not to drink. ? You are pregnant, may be pregnant, or are planning to become pregnant.  If you drink alcohol: ? Limit how much you use to 0-1 drink a day. ? Limit intake if you are breastfeeding.  Be aware of how much alcohol is in your drink. In the [place] one drink equals one 12 oz bottle of beer (355 mL), one 5 oz glass of wine (148 mL), or one 1 oz glass of hard liquor (44 mL). General instructions  Schedule regular health, dental, and eye exams.  Stay current with your vaccines.  Tell your health care provider if: ? You often feel depressed. ? You have ever been abused or do not feel safe at home. Summary  Adopting a healthy lifestyle and getting preventive care are important in promoting health and wellness.  Follow your health care provider's instructions about healthy  diet, exercising, and getting tested or screened for diseases.  Follow your health care provider's instructions on monitoring your cholesterol and blood pressure. This information is not intended to replace advice given to you by your health care provider. Make sure you discuss any questions you have with your health care provider. Document Released: 09/02/2010 Document Revised: 02/10/2018 Document Reviewed: 02/10/2018 Elsevier Patient Education  2020 [employer]  

## 2019-02-14 NOTE — Progress Notes (Signed)
Subjective:    Patient ID: Sheri Hood, female    DOB: [birth year], 53 y.o.   MRN: [id number]  HPI  Pt presents to the clinic today for her annual exam. She is also due to follow up chronic conditions.   Anxiety and Depression: Chronic, managed on Citalopram. She is not currently seeing a therapist or following with psych. She denies SI/HI.  Asthma: She was not using any inhalers until she got COVID 19. She is taking Flovent as prescribed and Albuterol as needed with good relief of symptoms.   Arthritis: Mainly in her elbows, wrist, knees and ankles. She takes Diclofenac as needed with good relief of symptoms.   GERD: Triggered by most things she eats. She denies breakthrough on Omeprazole. There is no upper GI on file.  HTN: Her BP today is 128/84. She takes HCTZ as prescribed. ECG from 11/2010 reviewed.  DM 2: Her last A1C was 9.2, 11/2018. She is taking Lantus and Glipizide as prescribed. She has not been checking lately due to COVID 19.. She checks her feet routinely.   OSA: She averages 8 hours of night per sleep with the use of her CPAP. There is no sleep study on file.   HLD: Her last LDL was 74, 07/2018. She denies myalgias on Rosuvastatin. She does eat some fried foods.  Flu: 11/2018 Tetanus: 01/2014 Pneumovax: 01/2017 Pap Smear: 01/2017, [facility]: 10/2018 Colon Screening: 05/2018, 10 years Vision Screening: annually Dentist: biannually  Diet: She does eat meat. She consumes fruits and veggies daily. She does eat some fried foods. She drinks mostly pepsi, some water with crystal light Exercise: None  Review of Systems      Past Medical History:  Diagnosis Date  . Allergy   . Anemia    s/p ablation for heavy bleeding   . Anxiety   . Arthritis    arhtritis middle back, scolosis  . Asthma   . Depression   . Diabetes mellitus without complication ([place])   . GERD (gastroesophageal reflux disease)   . Headache(784.0)    h/x migraines  . Heart  murmur    recent diagnosed- / ehho 12/30/10 report on chart  . Hiatal hernia   . Hyperlipidemia   . Hypertension   . Recurrent upper respiratory infection (URI)    occ sore throat and slightly stuffy- no fever or cough  . Sleep apnea    On CPAP  . Sleep apnea     Current Outpatient Medications  Medication Sig Dispense Refill  . albuterol (VENTOLIN HFA) 108 (90 Base) MCG/ACT inhaler Inhale 1-2 puffs into the lungs every 6 (six) hours as needed for wheezing or shortness of breath. 18 g 0  . azithromycin (ZITHROMAX) 250 MG tablet Take 2 tabs today, then 1 tab daily x 4 days 6 tablet 0  . cetirizine (ZYRTEC) 10 MG tablet Take 10 mg by mouth daily.    . citalopram (CELEXA) 40 MG tablet Take 1 tablet (40 mg total) by mouth daily. 90 tablet 1  . Continuous Blood Gluc Receiver (FREESTYLE LIBRE 14 DAY READER) DEVI 1 Device by Does not apply route every morning. 1 each 2  . Continuous Blood Gluc Sensor (FREESTYLE LIBRE 14 DAY SENSOR) MISC 1 Device by Does not apply route every 14 (fourteen) days. 6 each 3  . diclofenac (VOLTAREN) 75 MG EC tablet TAKE 1 TABLET BY MOUTH TWICE DAILY (Patient taking differently: Take 75 mg by mouth 2 (two) times daily as needed. ) 60 tablet  4  . doxycycline (VIBRAMYCIN) 100 MG capsule TAKE 1 CAPSULE BY MOUTH TWICE DAILY WITH FOOD AND PLENTY OF DRINK    . fluticasone (FLOVENT HFA) 44 MCG/ACT inhaler Inhale 2 puffs into the lungs 2 (two) times daily. 1 Inhaler 0  . glipiZIDE (GLUCOTROL) 10 MG tablet TAKE 1 TABLET BY MOUTH TWICE DAILY BEFORE A MEAL 180 tablet 0  . glucose blood (BAYER CONTOUR NEXT TEST) test strip Use as instructed to test blood sugar once daily. E11.9 100 each 12  . hydrochlorothiazide (HYDRODIURIL) 25 MG tablet Take 1 tablet (25 mg total) by mouth every morning. 90 tablet 0  . ibuprofen (ADVIL,MOTRIN) 200 MG tablet Take 200 mg by mouth every 6 (six) hours as needed. For pain    . Insulin Glargine (LANTUS SOLOSTAR) 100 UNIT/ML Solostar Pen Inject 34 Units  into the skin at bedtime. 10 pen 1  . Insulin Pen Needle (B-D UF III MINI PEN NEEDLES) 31G X 5 MM MISC 1 each by Does not apply route daily. 100 each 1  . Microlet Lancets MISC USE AS INSTRUCTED TO TEST BLOOD SUGAR ONCE DAILY 100 each 0  . omeprazole (PRILOSEC) 20 MG capsule Take 20 mg by mouth daily.    . promethazine-dextromethorphan (PROMETHAZINE-DM) 6.25-15 MG/5ML syrup Take 5 mLs by mouth 4 (four) times daily as needed. 118 mL 0  . rosuvastatin (CRESTOR) 10 MG tablet Take 1 tablet by mouth once daily 90 tablet 1  . vitamin E 400 UNIT capsule Take 800 Units by mouth at bedtime.      No current facility-administered medications for this visit.    Allergies  Allergen Reactions  . Benadryl [Diphenhydramine Hcl] Other (See Comments)    Keeps awake, jittery  . Codeine Other (See Comments)    Keeps awake  . Dilaudid [Hydromorphone Hcl] Itching and Nausea Only  . Morphine And Related Itching and Nausea Only    Family History  Problem Relation Age of Onset  . Diabetes Father   . Breast cancer Neg Hx   . Colon cancer Neg Hx   . Esophageal cancer Neg Hx   . Rectal cancer Neg Hx   . Stomach cancer Neg Hx     Social History   Socioeconomic History  . Marital status: Married    Spouse name: Not on file  . Number of children: Not on file  . Years of education: Not on file  . Highest education level: Not on file  Occupational History  . Not on file  Tobacco Use  . Smoking status: Former Smoker    Packs/day: 0.50    Years: 4.00    Pack years: 2.00    Types: Cigarettes    Quit date: 01/02/1988    Years since quitting: 31.1  . Smokeless tobacco: Never Used  Substance and Sexual Activity  . Alcohol use: Yes    Alcohol/week: 0.0 standard drinks    Comment: Occasional Drink  . Drug use: No  . Sexual activity: Never  Other Topics Concern  . Not on file  Social History Narrative  . Not on file   Social Determinants of Health   Financial Resource Strain:   . Difficulty of  Paying Living Expenses: Not on file  Food Insecurity:   . Worried About Charity fundraiser in the Last Year: Not on file  . Ran Out of Food in the Last Year: Not on file  Transportation Needs:   . Lack of Transportation (Medical): Not on file  . Lack  of Transportation (Non-Medical): Not on file  Physical Activity:   . Days of Exercise per Week: Not on file  . Minutes of Exercise per Session: Not on file  Stress:   . Feeling of Stress : Not on file  Social Connections:   . Frequency of Communication with Friends and Family: Not on file  . Frequency of Social Gatherings with Friends and Family: Not on file  . Attends Religious Services: Not on file  . Active Member of Clubs or Organizations: Not on file  . Attends Archivist Meetings: Not on file  . Marital Status: Not on file  Intimate Partner Violence:   . Fear of Current or Ex-Partner: Not on file  . Emotionally Abused: Not on file  . Physically Abused: Not on file  . Sexually Abused: Not on file     Constitutional:  Pt reports fatigue. Denies fever, malaise, headache or abrupt weight changes.  HEENT: Pt reports change in taste. Denies eye pain, eye redness, ear pain, ringing in the ears, wax buildup, runny nose, nasal congestion, bloody nose, or sore throat. Respiratory: Pt reports cough. Denies difficulty breathing, shortness of breath, or sputum production.   Cardiovascular: Denies chest pain, chest tightness, palpitations or swelling in the hands or feet.  Gastrointestinal: Denies abdominal pain, bloating, constipation, diarrhea or blood in the stool.  GU: Denies urgency, frequency, pain with urination, burning sensation, blood in urine, odor or discharge. Musculoskeletal: Pt reports intermittent joint pain. Denies decrease in range of motion, difficulty with gait, muscle pain or joint swelling.  Skin: Denies redness, rashes, lesions or ulcercations.  Neurological: Denies dizziness, difficulty with memory,  difficulty with speech or problems with balance and coordination.  Psych: Pt has a history of anxiety and depression. Denies SI/HI.  No other specific complaints in a complete review of systems (except as listed in HPI above).  Objective:   Physical Exam  BP 128/84   Pulse 68   Temp 97.9 F (36.6 C) (Temporal)   Ht 5' 3.25" (1.607 m)   Wt 208 lb (94.3 kg)   SpO2 98%   BMI 36.55 kg/m   Wt Readings from Last 3 Encounters:  11/24/18 215 lb (97.5 kg)  05/19/18 212 lb (96.2 kg)  05/04/18 212 lb 9.6 oz (96.4 kg)    General: Appears her stated age, obese,  in NAD. Skin: Warm, dry and intact. No ulcerations noted. HEENT: Head: normal shape and size; Eyes: sclera white, no icterus, conjunctiva pink, PERRLA and EOMs intact; Throat/Mouth: Teeth present, mucosa mildly erythematous and moist, +PND,  no exudate, lesions or ulcerations noted.  Neck:  Neck supple, trachea midline. No masses, lumps or thyromegaly present.  Cardiovascular: Normal rate and rhythm. S1,S2 noted.  No murmur, rubs or gallops noted. No JVD or BLE edema. No carotid bruits noted. Pulmonary/Chest: Normal effort and positive vesicular breath sounds. No respiratory distress. No wheezes, rales or ronchi noted.  Abdomen: Soft and nontender. Normal bowel sounds. No distention or masses noted. Liver, spleen and kidneys non palpable. Musculoskeletal: Strength 5/5 BUE/BLE. No difficulty with gait.  Neurological: Alert and oriented. Cranial nerves II-XII grossly intact. Coordination normal.  Psychiatric: Mood and affect normal. Behavior is normal. Judgment and thought content normal.    BMET    Component Value Date/Time   NA 139 07/27/2018 0911   K 3.9 07/27/2018 0911   CL 98 07/27/2018 0911   CO2 25 07/27/2018 0911   GLUCOSE 281 (H) 07/27/2018 0911   BUN 12 07/27/2018 0911  CREATININE 0.85 07/27/2018 0911   CALCIUM 9.9 07/27/2018 0911   GFRNONAA 80 07/27/2018 0911   GFRAA 92 07/27/2018 0911    Lipid Panel       Component Value Date/Time   CHOL 138 07/27/2018 0911   TRIG 121 07/27/2018 0911   HDL 40 07/27/2018 0911   CHOLHDL 3.5 07/27/2018 0911   LDLCALC 74 07/27/2018 0911    CBC    Component Value Date/Time   WBC 4.3 07/27/2018 0911   RBC 5.30 (H) 07/27/2018 0911   HGB 14.7 07/27/2018 0911   HCT 44.5 07/27/2018 0911   PLT 161 07/27/2018 0911   MCV 84 07/27/2018 0911   MCH 27.7 07/27/2018 0911   MCHC 33.0 07/27/2018 0911   RDW 12.6 07/27/2018 0911   LYMPHSABS 1.7 01/12/2017 1324   EOSABS 0.1 01/12/2017 1324   BASOSABS 0.0 01/12/2017 1324    Hgb A1C Lab Results  Component Value Date   HGBA1C 9.2 (A) 11/24/2018            Assessment & Plan:   Preventative Health Maintenance:  Flu shot UTD Tetanus UTD Pneumovax UTD Pap smear UTD Mammogram UTD Referral to GI placed for screening colonoscopy Encouraged her to consume a balanced diet and exercise regimen Advised her to see an eye doctor and dentist annually Will check CBC, CMET, Lipid, A1C, urine microalbumin and Vit D today  RTC in 3 months, follow up DM 2 Webb Silversmith, NP  This visit occurred during the SARS-CoV-2 public health emergency.  Safety protocols were in place, including screening questions prior to the visit, additional usage of staff PPE, and extensive cleaning of exam room while observing appropriate contact time as indicated for disinfecting solutions.

## 2019-03-02 DIAGNOSIS — H02823 Cysts of right eye, unspecified eyelid: Secondary | ICD-10-CM | POA: Diagnosis not present

## 2019-04-29 ENCOUNTER — Other Ambulatory Visit: Payer: Self-pay | Admitting: Internal Medicine

## 2019-05-09 ENCOUNTER — Encounter: Payer: Self-pay | Admitting: Internal Medicine

## 2019-05-16 ENCOUNTER — Other Ambulatory Visit: Payer: Self-pay | Admitting: Internal Medicine

## 2019-07-05 ENCOUNTER — Other Ambulatory Visit: Payer: Self-pay

## 2019-07-05 ENCOUNTER — Other Ambulatory Visit (INDEPENDENT_AMBULATORY_CARE_PROVIDER_SITE_OTHER): Payer: BC Managed Care – PPO

## 2019-07-05 DIAGNOSIS — F419 Anxiety disorder, unspecified: Secondary | ICD-10-CM | POA: Diagnosis not present

## 2019-07-05 DIAGNOSIS — E119 Type 2 diabetes mellitus without complications: Secondary | ICD-10-CM | POA: Diagnosis not present

## 2019-07-05 DIAGNOSIS — E7800 Pure hypercholesterolemia, unspecified: Secondary | ICD-10-CM | POA: Diagnosis not present

## 2019-07-05 DIAGNOSIS — E559 Vitamin D deficiency, unspecified: Secondary | ICD-10-CM

## 2019-07-05 DIAGNOSIS — F32A Depression, unspecified: Secondary | ICD-10-CM

## 2019-07-05 DIAGNOSIS — K219 Gastro-esophageal reflux disease without esophagitis: Secondary | ICD-10-CM

## 2019-07-05 DIAGNOSIS — F329 Major depressive disorder, single episode, unspecified: Secondary | ICD-10-CM

## 2019-07-05 NOTE — Addendum Note (Signed)
Addended by: Cloyd Stagers on: 07/05/2019 01:31 PM   Modules accepted: Orders

## 2019-07-06 ENCOUNTER — Encounter: Payer: Self-pay | Admitting: Internal Medicine

## 2019-07-06 DIAGNOSIS — E119 Type 2 diabetes mellitus without complications: Secondary | ICD-10-CM

## 2019-07-06 LAB — CBC
Hematocrit: 47.2 % — ABNORMAL HIGH (ref 34.0–46.6)
Hemoglobin: 15.6 g/dL (ref 11.1–15.9)
MCH: 28.4 pg (ref 26.6–33.0)
MCHC: 33.1 g/dL (ref 31.5–35.7)
MCV: 86 fL (ref 79–97)
Platelets: 174 10*3/uL (ref 150–450)
RBC: 5.49 x10E6/uL — ABNORMAL HIGH (ref 3.77–5.28)
RDW: 12.7 % (ref 11.7–15.4)
WBC: 5.5 10*3/uL (ref 3.4–10.8)

## 2019-07-06 LAB — COMPREHENSIVE METABOLIC PANEL
ALT: 56 IU/L — ABNORMAL HIGH (ref 0–32)
AST: 43 IU/L — ABNORMAL HIGH (ref 0–40)
Albumin/Globulin Ratio: 2.1 (ref 1.2–2.2)
Albumin: 4.9 g/dL (ref 3.8–4.9)
Alkaline Phosphatase: 86 IU/L (ref 39–117)
BUN/Creatinine Ratio: 18 (ref 9–23)
BUN: 15 mg/dL (ref 6–24)
Bilirubin Total: 0.4 mg/dL (ref 0.0–1.2)
CO2: 25 mmol/L (ref 20–29)
Calcium: 9.6 mg/dL (ref 8.7–10.2)
Chloride: 98 mmol/L (ref 96–106)
Creatinine, Ser: 0.83 mg/dL (ref 0.57–1.00)
GFR calc Af Amer: 94 mL/min/{1.73_m2} (ref >59)
GFR calc non Af Amer: 81 mL/min/{1.73_m2} (ref >59)
Globulin, Total: 2.3 g/dL (ref 1.5–4.5)
Glucose: 170 mg/dL — ABNORMAL HIGH (ref 65–99)
Potassium: 3.7 mmol/L (ref 3.5–5.2)
Sodium: 139 mmol/L (ref 134–144)
Total Protein: 7.2 g/dL (ref 6.0–8.5)

## 2019-07-06 LAB — VITAMIN D 25 HYDROXY (VIT D DEFICIENCY, FRACTURES): Vit D, 25-Hydroxy: 46.1 ng/mL (ref 30.0–100.0)

## 2019-07-06 LAB — LIPID PANEL
Chol/HDL Ratio: 4.1 ratio (ref 0.0–4.4)
Cholesterol, Total: 188 mg/dL (ref 100–199)
HDL: 46 mg/dL (ref >39)
LDL Chol Calc (NIH): 116 mg/dL — ABNORMAL HIGH (ref 0–99)
Triglycerides: 148 mg/dL (ref 0–149)
VLDL Cholesterol Cal: 26 mg/dL (ref 5–40)

## 2019-07-06 LAB — MICROALBUMIN / CREATININE URINE RATIO
Creatinine, Urine: 76.1 mg/dL
Microalb/Creat Ratio: 10 mg/g creat (ref 0–29)
Microalbumin, Urine: 7.6 ug/mL

## 2019-07-06 LAB — HEMOGLOBIN A1C
Est. average glucose Bld gHb Est-mCnc: 214 mg/dL
Hgb A1c MFr Bld: 9.1 % — ABNORMAL HIGH (ref 4.8–5.6)

## 2019-07-19 ENCOUNTER — Other Ambulatory Visit: Payer: Self-pay | Admitting: Internal Medicine

## 2019-07-29 ENCOUNTER — Encounter: Payer: Self-pay | Admitting: Internal Medicine

## 2019-07-29 MED ORDER — BD PEN NEEDLE MINI U/F 31G X 5 MM MISC: 1.0000 | Freq: Every day | 0 refills | Status: DC

## 2019-07-30 ENCOUNTER — Encounter: Payer: Self-pay | Admitting: Internal Medicine

## 2019-07-31 ENCOUNTER — Other Ambulatory Visit: Payer: Self-pay | Admitting: Internal Medicine

## 2019-08-02 ENCOUNTER — Other Ambulatory Visit: Payer: Self-pay | Admitting: Internal Medicine

## 2019-08-02 ENCOUNTER — Encounter: Payer: Self-pay | Admitting: Internal Medicine

## 2019-08-03 MED ORDER — CITALOPRAM HYDROBROMIDE 40 MG PO TABS: 40.0000 mg | ORAL_TABLET | Freq: Every day | ORAL | 0 refills | Status: DC

## 2019-08-03 MED ORDER — GLIPIZIDE 10 MG PO TABS: 10.0000 mg | ORAL_TABLET | Freq: Two times a day (BID) | ORAL | 0 refills | Status: DC

## 2019-08-08 DIAGNOSIS — Z794 Long term (current) use of insulin: Secondary | ICD-10-CM | POA: Diagnosis not present

## 2019-08-08 DIAGNOSIS — E785 Hyperlipidemia, unspecified: Secondary | ICD-10-CM | POA: Diagnosis not present

## 2019-08-08 DIAGNOSIS — E1165 Type 2 diabetes mellitus with hyperglycemia: Secondary | ICD-10-CM | POA: Diagnosis not present

## 2019-08-08 DIAGNOSIS — E1169 Type 2 diabetes mellitus with other specified complication: Secondary | ICD-10-CM | POA: Diagnosis not present

## 2019-10-17 ENCOUNTER — Other Ambulatory Visit: Payer: Self-pay | Admitting: Internal Medicine

## 2019-10-18 DIAGNOSIS — E785 Hyperlipidemia, unspecified: Secondary | ICD-10-CM | POA: Diagnosis not present

## 2019-10-18 DIAGNOSIS — E1169 Type 2 diabetes mellitus with other specified complication: Secondary | ICD-10-CM | POA: Diagnosis not present

## 2019-10-25 DIAGNOSIS — E785 Hyperlipidemia, unspecified: Secondary | ICD-10-CM | POA: Diagnosis not present

## 2019-10-25 DIAGNOSIS — E1169 Type 2 diabetes mellitus with other specified complication: Secondary | ICD-10-CM | POA: Diagnosis not present

## 2019-10-25 DIAGNOSIS — Z794 Long term (current) use of insulin: Secondary | ICD-10-CM | POA: Diagnosis not present

## 2019-10-28 ENCOUNTER — Other Ambulatory Visit: Payer: Self-pay | Admitting: Internal Medicine

## 2019-10-28 ENCOUNTER — Encounter: Payer: Self-pay | Admitting: Internal Medicine

## 2019-10-28 MED ORDER — BD PEN NEEDLE MINI U/F 31G X 5 MM MISC: 1.0000 | Freq: Every day | 0 refills | Status: DC

## 2019-10-28 MED ORDER — DICLOFENAC SODIUM 75 MG PO TBEC: 75.0000 mg | DELAYED_RELEASE_TABLET | Freq: Two times a day (BID) | ORAL | 1 refills | Status: DC

## 2019-11-14 ENCOUNTER — Other Ambulatory Visit: Payer: Self-pay

## 2019-11-14 ENCOUNTER — Telehealth (INDEPENDENT_AMBULATORY_CARE_PROVIDER_SITE_OTHER): Payer: BC Managed Care – PPO | Admitting: Internal Medicine

## 2019-11-14 ENCOUNTER — Encounter: Payer: Self-pay | Admitting: Internal Medicine

## 2019-11-14 DIAGNOSIS — J3489 Other specified disorders of nose and nasal sinuses: Secondary | ICD-10-CM

## 2019-11-14 DIAGNOSIS — R509 Fever, unspecified: Secondary | ICD-10-CM | POA: Diagnosis not present

## 2019-11-14 DIAGNOSIS — R0981 Nasal congestion: Secondary | ICD-10-CM

## 2019-11-14 DIAGNOSIS — R05 Cough: Secondary | ICD-10-CM | POA: Diagnosis not present

## 2019-11-14 DIAGNOSIS — Z20822 Contact with and (suspected) exposure to covid-19: Secondary | ICD-10-CM | POA: Diagnosis not present

## 2019-11-14 DIAGNOSIS — R059 Cough, unspecified: Secondary | ICD-10-CM

## 2019-11-14 MED ORDER — PROMETHAZINE-DM 6.25-15 MG/5ML PO SYRP: 5.0000 mL | ORAL_SOLUTION | Freq: Four times a day (QID) | ORAL | 0 refills | Status: DC | PRN

## 2019-11-14 MED ORDER — PREDNISONE 10 MG PO TABS: ORAL_TABLET | ORAL | 0 refills | Status: DC

## 2019-11-14 NOTE — Progress Notes (Signed)
Virtual Visit via Video Note  I connected with Sheri Hood on 11/14/19 at  2:15 PM EDT by a video enabled telemedicine application and verified that I am speaking with the correct person using two identifiers.  Location: Patient: Home Provider: Office  Persons participating in this video call: Webb Silversmith NP-C and Dorismar Chay  I discussed the limitations of evaluation and management by telemedicine and the availability of in person appointments. The patient expressed understanding and agreed to proceed.  History of Present Illness:  Pt reports fever, sinus pressure, nasal congestion, nausea and cough. This started 4-5 days ago. She is blowing yellow mucous out of her nose. The cough is productive of yellow mucous. She denies headache, dizziness, visual changes, ear pain, sore throat or SOB. She intermittently has loss of taste/smell. She has run fevers < 100, but denies chills or body aches. She denies vomiting or diarrhea. She had Covid 01/2019 but has not gotten her vaccines. She has tried Copywriter, advertising Sinus, antihistamines and Tylenol OTC with some relief. She has had sick contacts diagnosed with Covid.  Past Medical History:  Diagnosis Date  . Allergy   . Anemia    s/p ablation for heavy bleeding   . Anxiety   . Arthritis    arhtritis middle back, scolosis  . Asthma   . Depression   . Diabetes mellitus without complication ([place])   . GERD (gastroesophageal reflux disease)   . Headache(784.0)    h/x migraines  . Heart murmur    recent diagnosed- / ehho 12/30/10 report on chart  . Hiatal hernia   . Hyperlipidemia   . Hypertension   . Recurrent upper respiratory infection (URI)    occ sore throat and slightly stuffy- no fever or cough  . Sleep apnea    On CPAP  . Sleep apnea     Current Outpatient Medications  Medication Sig Dispense Refill  . albuterol (VENTOLIN HFA) 108 (90 Base) MCG/ACT inhaler Inhale 1-2 puffs into the lungs every 6 (six) hours as needed for  wheezing or shortness of breath. 18 g 0  . B-D UF III MINI PEN NEEDLES 31G X 5 MM MISC USE 1  ONCE DAILY AS DIRECTED 100 each 0  . cetirizine (ZYRTEC) 10 MG tablet Take 10 mg by mouth daily.    . citalopram (CELEXA) 40 MG tablet Take 1 tablet by mouth once daily 90 tablet 0  . Continuous Blood Gluc Receiver (FREESTYLE LIBRE 14 DAY READER) DEVI 1 Device by Does not apply route every morning. 1 each 2  . Continuous Blood Gluc Sensor (FREESTYLE LIBRE 14 DAY SENSOR) MISC 1 Device by Does not apply route every 14 (fourteen) days. 6 each 3  . diclofenac (VOLTAREN) 75 MG EC tablet Take 1 tablet (75 mg total) by mouth 2 (two) times daily. 60 tablet 1  . fluticasone (FLOVENT HFA) 44 MCG/ACT inhaler Inhale 2 puffs into the lungs 2 (two) times daily. 1 Inhaler 0  . glipiZIDE (GLUCOTROL) 10 MG tablet Take 1 tablet (10 mg total) by mouth 2 (two) times daily before a meal. 60 tablet 0  . glucose blood (BAYER CONTOUR NEXT TEST) test strip Use as instructed to test blood sugar once daily. E11.9 100 each 12  . hydrochlorothiazide (HYDRODIURIL) 25 MG tablet TAKE 1 TABLET BY MOUTH ONCE DAILY IN THE MORNING 90 tablet 0  . ibuprofen (ADVIL,MOTRIN) 200 MG tablet Take 200 mg by mouth every 6 (six) hours as needed. For pain    .  Insulin Glargine (LANTUS SOLOSTAR) 100 UNIT/ML Solostar Pen Inject 34 Units into the skin at bedtime. 10 pen 1  . Insulin Pen Needle (B-D UF III MINI PEN NEEDLES) 31G X 5 MM MISC 1 each by Does not apply route daily. 30 each 0  . Microlet Lancets MISC USE AS DIRECTED 1 ONCE DAILY TO TEST BLOOD SUGAR 100 each 0  . omeprazole (PRILOSEC) 20 MG capsule Take 20 mg by mouth daily.    . rosuvastatin (CRESTOR) 10 MG tablet Take 1 tablet by mouth once daily 90 tablet 0  . vitamin E 400 UNIT capsule Take 800 Units by mouth at bedtime.      No current facility-administered medications for this visit.    Allergies  Allergen Reactions  . Benadryl [Diphenhydramine Hcl] Other (See Comments)    Keeps  awake, jittery  . Codeine Other (See Comments)    Keeps awake  . Dilaudid [Hydromorphone Hcl] Itching and Nausea Only  . Morphine And Related Itching and Nausea Only    Family History  Problem Relation Age of Onset  . Diabetes Father   . Breast cancer Neg Hx   . Colon cancer Neg Hx   . Esophageal cancer Neg Hx   . Rectal cancer Neg Hx   . Stomach cancer Neg Hx     Social History   Socioeconomic History  . Marital status: Married    Spouse name: Not on file  . Number of children: Not on file  . Years of education: Not on file  . Highest education level: Not on file  Occupational History  . Not on file  Tobacco Use  . Smoking status: Former Smoker    Packs/day: 0.50    Years: 4.00    Pack years: 2.00    Types: Cigarettes    Quit date: 01/02/1988    Years since quitting: 31.8  . Smokeless tobacco: Never Used  Substance and Sexual Activity  . Alcohol use: Yes    Alcohol/week: 0.0 standard drinks    Comment: Occasional Drink  . Drug use: No  . Sexual activity: Never  Other Topics Concern  . Not on file  Social History Narrative  . Not on file   Social Determinants of Health   Financial Resource Strain:   . Difficulty of Paying Living Expenses: Not on file  Food Insecurity:   . Worried About Charity fundraiser in the Last Year: Not on file  . Ran Out of Food in the Last Year: Not on file  Transportation Needs:   . Lack of Transportation (Medical): Not on file  . Lack of Transportation (Non-Medical): Not on file  Physical Activity:   . Days of Exercise per Week: Not on file  . Minutes of Exercise per Session: Not on file  Stress:   . Feeling of Stress : Not on file  Social Connections:   . Frequency of Communication with Friends and Family: Not on file  . Frequency of Social Gatherings with Friends and Family: Not on file  . Attends Religious Services: Not on file  . Active Member of Clubs or Organizations: Not on file  . Attends Archivist  Meetings: Not on file  . Marital Status: Not on file  Intimate Partner Violence:   . Fear of Current or Ex-Partner: Not on file  . Emotionally Abused: Not on file  . Physically Abused: Not on file  . Sexually Abused: Not on file     Constitutional: Pt reports fever.  Denies malaise, fatigue, headache or abrupt weight changes.  HEENT: Pt reports nasal congestion. Denies eye pain, eye redness, ear pain, ringing in the ears, wax buildup, runny nose, bloody nose, or sore throat. Respiratory: Pt reports cough. Denies difficulty breathing, shortness of breath.   Cardiovascular: Denies chest pain, chest tightness, palpitations or swelling in the hands or feet.  Gastrointestinal: Pt reports nausea. Denies abdominal pain, bloating, constipation, diarrhea or blood in the stool.   No other specific complaints in a complete review of systems (except as listed in HPI above).    Observations/Objective:  Wt Readings from Last 3 Encounters:  02/14/19 208 lb (94.3 kg)  11/24/18 215 lb (97.5 kg)  05/19/18 212 lb (96.2 kg)    General: Appears her stated age, ill appearing but in NAD. HEENT: Head: normal shape and size;  Pulmonary/Chest: Normal effort. No respiratory distress.  Neurological: Alert and oriented.   BMET    Component Value Date/Time   NA 139 07/05/2019 1327   K 3.7 07/05/2019 1327   CL 98 07/05/2019 1327   CO2 25 07/05/2019 1327   GLUCOSE 170 (H) 07/05/2019 1327   BUN 15 07/05/2019 1327   CREATININE 0.83 07/05/2019 1327   CALCIUM 9.6 07/05/2019 1327   GFRNONAA 81 07/05/2019 1327   GFRAA 94 07/05/2019 1327    Lipid Panel     Component Value Date/Time   CHOL 188 07/05/2019 1327   TRIG 148 07/05/2019 1327   HDL 46 07/05/2019 1327   CHOLHDL 4.1 07/05/2019 1327   LDLCALC 116 (H) 07/05/2019 1327    CBC    Component Value Date/Time   WBC 5.5 07/05/2019 1327   RBC 5.49 (H) 07/05/2019 1327   HGB 15.6 07/05/2019 1327   HCT 47.2 (H) 07/05/2019 1327   PLT 174 07/05/2019  1327   MCV 86 07/05/2019 1327   MCH 28.4 07/05/2019 1327   MCHC 33.1 07/05/2019 1327   RDW 12.7 07/05/2019 1327   LYMPHSABS 1.7 01/12/2017 1324   EOSABS 0.1 01/12/2017 1324   BASOSABS 0.0 01/12/2017 1324    Hgb A1C Lab Results  Component Value Date   HGBA1C 9.1 (H) 07/05/2019        Assessment and Plan:  Sinus Pressure, Nasal Congestion, Cough, Fever:  Advised her to go get Covid tested Recommended rest, fluids, Tylenol OTC RX for Pred Taper x 6 days RX for Promethazine DM cough syrup No indication for abx at this time Encouraged social distancing, masking, frequent handwashing and self quarantine Work note provided  Return precautions discussed  Follow Up Instructions:    I discussed the assessment and treatment plan with the patient. The patient was provided an opportunity to ask questions and all were answered. The patient agreed with the plan and demonstrated an understanding of the instructions.   The patient was advised to call back or seek an in-person evaluation if the symptoms worsen or if the condition fails to improve as anticipated.   Webb Silversmith, NP

## 2019-11-16 ENCOUNTER — Encounter: Payer: Self-pay | Admitting: Internal Medicine

## 2019-11-16 ENCOUNTER — Other Ambulatory Visit: Payer: Self-pay | Admitting: Internal Medicine

## 2019-11-16 DIAGNOSIS — Z1231 Encounter for screening mammogram for malignant neoplasm of breast: Secondary | ICD-10-CM

## 2019-11-18 MED ORDER — AMOXICILLIN-POT CLAVULANATE 875-125 MG PO TABS: 1.0000 | ORAL_TABLET | Freq: Two times a day (BID) | ORAL | 0 refills | Status: DC

## 2019-11-18 NOTE — Addendum Note (Signed)
Addended by: Jearld Fenton on: 11/18/2019 01:32 PM   Modules accepted: Orders

## 2019-12-05 ENCOUNTER — Ambulatory Visit
Admission: RE | Admit: 2019-12-05 | Discharge: 2019-12-05 | Disposition: A | Discharge status: Home / Self Care | Payer: BC Managed Care – PPO | Source: Ambulatory Visit | Attending: Internal Medicine | Admitting: Internal Medicine

## 2019-12-05 ENCOUNTER — Other Ambulatory Visit: Payer: Self-pay

## 2019-12-05 DIAGNOSIS — Z1231 Encounter for screening mammogram for malignant neoplasm of breast: Secondary | ICD-10-CM

## 2019-12-20 ENCOUNTER — Ambulatory Visit: Payer: BC Managed Care – PPO | Admitting: Dermatology

## 2019-12-20 ENCOUNTER — Other Ambulatory Visit: Payer: Self-pay | Admitting: Dermatology

## 2019-12-20 ENCOUNTER — Other Ambulatory Visit: Payer: Self-pay

## 2019-12-20 DIAGNOSIS — B351 Tinea unguium: Secondary | ICD-10-CM | POA: Diagnosis not present

## 2019-12-20 DIAGNOSIS — D225 Melanocytic nevi of trunk: Secondary | ICD-10-CM

## 2019-12-20 DIAGNOSIS — L7211 Pilar cyst: Secondary | ICD-10-CM

## 2019-12-20 DIAGNOSIS — D1800 Hemangioma unspecified site: Secondary | ICD-10-CM

## 2019-12-20 DIAGNOSIS — D2372 Other benign neoplasm of skin of left lower limb, including hip: Secondary | ICD-10-CM | POA: Diagnosis not present

## 2019-12-20 DIAGNOSIS — L578 Other skin changes due to chronic exposure to nonionizing radiation: Secondary | ICD-10-CM

## 2019-12-20 DIAGNOSIS — L918 Other hypertrophic disorders of the skin: Secondary | ICD-10-CM | POA: Diagnosis not present

## 2019-12-20 DIAGNOSIS — D239 Other benign neoplasm of skin, unspecified: Secondary | ICD-10-CM

## 2019-12-20 DIAGNOSIS — Z1283 Encounter for screening for malignant neoplasm of skin: Secondary | ICD-10-CM | POA: Diagnosis not present

## 2019-12-20 DIAGNOSIS — L820 Inflamed seborrheic keratosis: Secondary | ICD-10-CM

## 2019-12-20 DIAGNOSIS — L821 Other seborrheic keratosis: Secondary | ICD-10-CM

## 2019-12-20 DIAGNOSIS — B353 Tinea pedis: Secondary | ICD-10-CM | POA: Diagnosis not present

## 2019-12-20 DIAGNOSIS — D229 Melanocytic nevi, unspecified: Secondary | ICD-10-CM

## 2019-12-20 DIAGNOSIS — D485 Neoplasm of uncertain behavior of skin: Secondary | ICD-10-CM

## 2019-12-20 DIAGNOSIS — L0292 Furuncle, unspecified: Secondary | ICD-10-CM

## 2019-12-20 DIAGNOSIS — L814 Other melanin hyperpigmentation: Secondary | ICD-10-CM

## 2019-12-20 DIAGNOSIS — L02221 Furuncle of abdominal wall: Secondary | ICD-10-CM

## 2019-12-20 MED ORDER — JUBLIA 10 % EX SOLN: CUTANEOUS | 5 refills | Status: DC

## 2019-12-20 MED ORDER — CICLOPIROX OLAMINE 0.77 % EX CREA: TOPICAL_CREAM | Freq: Two times a day (BID) | CUTANEOUS | 2 refills | Status: DC

## 2019-12-20 MED ORDER — MUPIROCIN 2 % EX OINT: 1.0000 "application " | TOPICAL_OINTMENT | Freq: Every day | CUTANEOUS | 0 refills | Status: DC

## 2019-12-20 MED ORDER — CLINDAMYCIN PHOSPHATE 1 % EX LOTN: TOPICAL_LOTION | Freq: Every day | CUTANEOUS | 1 refills | Status: DC

## 2019-12-20 NOTE — Patient Instructions (Signed)
Melanoma ABCDEs  Melanoma is the most dangerous type of skin cancer, and is the leading cause of death from skin disease.  You are more likely to develop melanoma if you:  Have light-colored skin, light-colored eyes, or red or blond hair  Spend a lot of time in the sun  Tan regularly, either outdoors or in a tanning bed  Have had blistering sunburns, especially during childhood  Have a close family member who has had a melanoma  Have atypical moles or large birthmarks  Early detection of melanoma is key since treatment is typically straightforward and cure rates are extremely high if we catch it early.   The first sign of melanoma is often a change in a mole or a new dark spot.  The ABCDE system is a way of remembering the signs of melanoma.  A for asymmetry:  The two halves do not match. B for border:  The edges of the growth are irregular. C for color:  A mixture of colors are present instead of an even brown color. D for diameter:  Melanomas are usually (but not always) greater than 6mm - the size of a pencil eraser. E for evolution:  The spot keeps changing in size, shape, and color.  Please check your skin once per month between visits. You can use a small mirror in front and a large mirror behind you to keep an eye on the back side or your body.   If you see any new or changing lesions before your next follow-up, please call to schedule a visit.  Please continue daily skin protection including broad spectrum sunscreen SPF 30+ to sun-exposed areas, reapplying every 2 hours as needed when you're outdoors.   Wound Care Instructions  1. Cleanse wound gently with soap and water once a day then pat dry with clean gauze. Apply a thing coat of Petrolatum (petroleum jelly, "Vaseline") over the wound (unless you have an allergy to this). We recommend that you use a new, sterile tube of Vaseline. Do not pick or remove scabs. Do not remove the yellow or white "healing tissue" from the  base of the wound.  2. Cover the wound with fresh, clean, nonstick gauze and secure with paper tape. You may use Band-Aids in place of gauze and tape if the would is small enough, but would recommend trimming much of the tape off as there is often too much. Sometimes Band-Aids can irritate the skin.  3. You should call the office for your biopsy report after 1 week if you have not already been contacted.  4. If you experience any problems, such as abnormal amounts of bleeding, swelling, significant bruising, significant pain, or evidence of infection, please call the office immediately.  5. FOR ADULT SURGERY PATIENTS: If you need something for pain relief you may take 1 extra strength Tylenol (acetaminophen) AND 2 Ibuprofen (200mg each) together every 4 hours as needed for pain. (do not take these if you are allergic to them or if you have a reason you should not take them.) Typically, you may only need pain medication for 1 to 3 days.      

## 2019-12-20 NOTE — Progress Notes (Signed)
Follow-Up Visit   Subjective  Sheri Hood is a 54 y.o. female who presents for the following: FBSE.  Patient here for full body skin exam and skin cancer screening. Patient does have a spot at right shoulder x 3 that feels strange, scaly. No itching or bleeding. Present for at least 6 months. She also has 2 spots that bleed, one at right neck and one at right inner thigh. She has been using neosporin.   The following portions of the chart were reviewed this encounter and updated as appropriate:  Tobacco  Allergies  Meds  Problems  Med Hx  Surg Hx  Fam Hx      Review of Systems:  No other skin or systemic complaints except as noted in HPI or Assessment and Plan.  Objective  Well appearing patient in no apparent distress; mood and affect are within normal limits.  A full examination was performed including scalp, head, eyes, ears, nose, lips, neck, chest, axillae, abdomen, back, buttocks, bilateral upper extremities, bilateral lower extremities, hands, feet, fingers, toes, fingernails, and toenails. All findings within normal limits unless otherwise noted below.  Objective  Left Antecubital Fossa: 0.9cm erythematous stuck on papule R/o ISK vs Verruca vs Other  Objective  Right Medial Thigh: 1.1cm pedunculated papule 0.5cm at base R/o lipofibroma vs other  Objective  Left lateral shin: Firm pink/brown papulenodule with dimple sign.   Objective  Feet: Scaling and maceration web spaces and over distal and lateral soles.   Objective  Toenails: Nail dystrophy  Objective  Left Frontal Scalp, mid vertex scalp: Subcutaneous nodule at scalp.   Objective  Right Neck: Erythematous keratotic or waxy stuck-on papule or plaque.   Objective  Inguinal folds/low abdomen: History of boils   Assessment & Plan  Neoplasm of uncertain behavior of skin (2) Left Antecubital Fossa  Epidermal / dermal shaving  Lesion diameter (cm):  0.9 Informed consent: discussed  and consent obtained   Timeout: patient name, date of birth, surgical site, and procedure verified   Patient was prepped and draped in usual sterile fashion: area prepped with isopropyl alcohol. Anesthesia: the lesion was anesthetized in a standard fashion   Anesthetic:  1% lidocaine w/ epinephrine 1-100,000 buffered w/ 8.4% NaHCO3 Instrument used: flexible razor blade   Hemostasis achieved with: aluminum chloride   Outcome: patient tolerated procedure well   Post-procedure details: wound care instructions given   Additional details:  Mupirocin and a bandage applied  Right Medial Thigh  Epidermal / dermal shaving  Lesion diameter (cm):  0.5 Informed consent: discussed and consent obtained   Timeout: patient name, date of birth, surgical site, and procedure verified   Patient was prepped and draped in usual sterile fashion: area prepped with isopropyl alcohol. Anesthesia: the lesion was anesthetized in a standard fashion   Anesthetic:  1% lidocaine w/ epinephrine 1-100,000 buffered w/ 8.4% NaHCO3 Instrument used: flexible razor blade   Hemostasis achieved with: aluminum chloride   Outcome: patient tolerated procedure well   Post-procedure details: wound care instructions given   Additional details:  Mupirocin and a bandage applied  Start mupirocin daily with bandage change  Other Related Procedures Anatomic Pathology Report  Ordered Medications: mupirocin ointment (BACTROBAN) 2 %  Dermatofibroma Left lateral shin  Benign-appearing.  Observation.  Call clinic for new or changing lesions.  Recommend daily use of broad spectrum spf 30+ sunscreen to sun-exposed areas.     Tinea pedis of both feet Feet  Start ciclopirox cream twice daily to feet  and in between toes.   Ordered Medications: ciclopirox (LOPROX) 0.77 % cream  Tinea unguium Toenails  Chronic, not at goal Pt not able to treat with terbinafine due to fatty liver.  Start Jublia nightly to nails.   Ordered  Medications: Efinaconazole (JUBLIA) 10 % SOLN  Pilar cyst Left Frontal Scalp, mid vertex scalp  Benign-appearing.  Observation.  Call clinic for new or changing lesions.  Recommend daily use of broad spectrum spf 30+ sunscreen to sun-exposed areas.    Inflamed seborrheic keratosis Right Neck  Prior to procedure, discussed risks of blister formation, small wound, skin dyspigmentation, or rare scar following cryotherapy.    Destruction of lesion - Right Neck  Destruction method: cryotherapy   Informed consent: discussed and consent obtained   Lesion destroyed using liquid nitrogen: Yes   Cryotherapy cycles:  2 Outcome: patient tolerated procedure well with no complications   Post-procedure details: wound care instructions given    Boils Inguinal folds/low abdomen  History of boils, possible HS vs Recurrent Infection at inguinal folds/lower abdomen  Start clindamycin lotion once daily  Ordered Medications: clindamycin (CLEOCIN-T) 1 % lotion  Nevus Spilus - Brown macules or papules within lighter tan patch at right flank - Genetic - Benign-appearing, observe - Call for any changes   Lentigines - Scattered tan macules - Discussed due to sun exposure - Benign-appearing, observe - Call for any changes  Seborrheic Keratoses - Stuck-on, waxy, tan-brown papules and plaques  - Discussed benign etiology and prognosis. - Observe - Call for any changes  Melanocytic Nevi - Tan-brown and/or pink-flesh-colored symmetric macules and papules - Benign appearing on exam today - Observation - Call clinic for new or changing moles - Recommend daily use of broad spectrum spf 30+ sunscreen to sun-exposed areas.   Hemangiomas - Red papules - Discussed benign nature - Observe - Call for any changes  Actinic Damage - diffuse scaly erythematous macules with underlying dyspigmentation - Recommend daily broad spectrum sunscreen SPF 30+ to sun-exposed areas, reapply every 2 hours  as needed.  - Call for new or changing lesions.  Skin cancer screening performed today.   Return in about 1 year (around 12/19/2020) for TBSE.  Graciella Belton, RMA, am acting as scribe for Forest Gleason, MD .  Documentation: I have reviewed the above documentation for accuracy and completeness, and I agree with the above.  Forest Gleason, MD

## 2019-12-27 LAB — ANATOMIC PATHOLOGY REPORT

## 2019-12-28 ENCOUNTER — Telehealth: Payer: Self-pay

## 2019-12-28 ENCOUNTER — Encounter: Payer: Self-pay | Admitting: Dermatology

## 2019-12-28 MED ORDER — CICLOPIROX 8 % EX SOLN: Freq: Every day | CUTANEOUS | 0 refills | Status: DC

## 2019-12-28 NOTE — Telephone Encounter (Signed)
Jublia PA has been denied. They want patient to have a trial and failure of Ciclopirox Solution. Okay to send in?

## 2019-12-28 NOTE — Telephone Encounter (Signed)
Please send in ciclopirox solution. Thank you

## 2019-12-28 NOTE — Telephone Encounter (Signed)
Prescription sent in and patient has been advised of change.

## 2020-01-02 ENCOUNTER — Other Ambulatory Visit: Payer: Self-pay | Admitting: Internal Medicine

## 2020-01-02 ENCOUNTER — Encounter: Payer: Self-pay | Admitting: Dermatology

## 2020-01-03 NOTE — Progress Notes (Signed)
Diagnosis synopsis: Comment  Comment: Specimen 1-Skin Biopsy, Left Antecubital Fossa: SEBORRHEIC  KERATOSIS, INFLAMED.   This is a benign growth or "wisdom spot". No additional treatment is needed.   Specimen 2-Skin Biopsy, Right Medial Thigh: ACROCHORDON  (FIBROEPITHELIAL POLYP), INFLAMED.   Skin tag, benign, no additional treatment needed.

## 2020-01-04 ENCOUNTER — Encounter: Payer: Self-pay | Admitting: Internal Medicine

## 2020-01-16 ENCOUNTER — Other Ambulatory Visit: Payer: Self-pay | Admitting: Internal Medicine

## 2020-01-23 DIAGNOSIS — E119 Type 2 diabetes mellitus without complications: Secondary | ICD-10-CM | POA: Diagnosis not present

## 2020-01-27 ENCOUNTER — Other Ambulatory Visit: Payer: Self-pay | Admitting: Internal Medicine

## 2020-01-29 MED ORDER — BD PEN NEEDLE MINI U/F 31G X 5 MM MISC
1.0000 | Freq: Every day | 0 refills | Status: DC
Start: 2020-01-29 — End: 2021-03-12

## 2020-01-31 DIAGNOSIS — Z794 Long term (current) use of insulin: Secondary | ICD-10-CM | POA: Diagnosis not present

## 2020-01-31 DIAGNOSIS — E785 Hyperlipidemia, unspecified: Secondary | ICD-10-CM | POA: Diagnosis not present

## 2020-01-31 DIAGNOSIS — E1169 Type 2 diabetes mellitus with other specified complication: Secondary | ICD-10-CM | POA: Diagnosis not present

## 2020-02-01 ENCOUNTER — Encounter: Payer: Self-pay | Admitting: Family Medicine

## 2020-02-01 ENCOUNTER — Ambulatory Visit: Payer: BC Managed Care – PPO | Admitting: Family Medicine

## 2020-02-01 ENCOUNTER — Other Ambulatory Visit: Payer: Self-pay

## 2020-02-01 VITALS — BP 104/70 | HR 79 | Temp 98.6°F | Ht 62.0 in | Wt 215.0 lb

## 2020-02-01 DIAGNOSIS — R300 Dysuria: Secondary | ICD-10-CM

## 2020-02-01 DIAGNOSIS — N3090 Cystitis, unspecified without hematuria: Secondary | ICD-10-CM | POA: Diagnosis not present

## 2020-02-01 LAB — POC URINALSYSI DIPSTICK (AUTOMATED)
Glucose, UA: POSITIVE — AB
Nitrite, UA: NEGATIVE
Protein, UA: POSITIVE — AB
Spec Grav, UA: >=1.03 — AB (ref 1.010–1.025)
Urobilinogen, UA: 0.2 E.U./dL
pH, UA: 5.5 (ref 5.0–8.0)

## 2020-02-01 MED ORDER — SULFAMETHOXAZOLE-TRIMETHOPRIM 800-160 MG PO TABS: 1.0000 | ORAL_TABLET | Freq: Two times a day (BID) | ORAL | 0 refills | Status: DC

## 2020-02-01 NOTE — Patient Instructions (Signed)
Good to see you today   Urinary Tract Infection, Adult A urinary tract infection (UTI) is an infection of any part of the urinary tract. The urinary tract includes:  The kidneys.  The ureters.  The bladder.  The urethra. These organs make, store, and get rid of pee (urine) in the body. What are the causes? This is caused by germs (bacteria) in your genital area. These germs grow and cause swelling (inflammation) of your urinary tract. What increases the risk? You are more likely to develop this condition if:  You have a small, thin tube (catheter) to drain pee.  You cannot control when you pee or poop (incontinence).  You are female, and: ? You use these methods to prevent pregnancy:  A medicine that kills sperm (spermicide).  A device that blocks sperm (diaphragm). ? You have low levels of a female hormone (estrogen). ? You are pregnant.  You have genes that add to your risk.  You are sexually active.  You take antibiotic medicines.  You have trouble peeing because of: ? A prostate that is bigger than normal, if you are female. ? A blockage in the part of your body that drains pee from the bladder (urethra). ? A kidney stone. ? A nerve condition that affects your bladder (neurogenic bladder). ? Not getting enough to drink. ? Not peeing often enough.  You have other conditions, such as: ? Diabetes. ? A weak disease-fighting system (immune system). ? Sickle cell disease. ? Gout. ? Injury of the spine. What are the signs or symptoms? Symptoms of this condition include:  Needing to pee right away (urgently).  Peeing often.  Peeing small amounts often.  Pain or burning when peeing.  Blood in the pee.  Pee that smells bad or not like normal.  Trouble peeing.  Pee that is cloudy.  Fluid coming from the vagina, if you are female.  Pain in the belly or lower back. Other symptoms include:  Throwing up (vomiting).  No urge to eat.  Feeling mixed up  (confused).  Being tired and grouchy (irritable).  A fever.  Watery poop (diarrhea). How is this treated? This condition may be treated with:  Antibiotic medicine.  Other medicines.  Drinking enough water. Follow these instructions at home:  Medicines  Take over-the-counter and prescription medicines only as told by your doctor.  If you were prescribed an antibiotic medicine, take it as told by your doctor. Do not stop taking it even if you start to feel better. General instructions  Make sure you: ? Pee until your bladder is empty. ? Do not hold pee for a long time. ? Empty your bladder after sex. ? Wipe from front to back after pooping if you are a female. Use each tissue one time when you wipe.  Drink enough fluid to keep your pee pale yellow.  Keep all follow-up visits as told by your doctor. This is important. Contact a doctor if:  You do not get better after 1-2 days.  Your symptoms go away and then come back. Get help right away if:  You have very bad back pain.  You have very bad pain in your lower belly.  You have a fever.  You are sick to your stomach (nauseous).  You are throwing up. Summary  A urinary tract infection (UTI) is an infection of any part of the urinary tract.  This condition is caused by germs in your genital area.  There are many risk factors for a   UTI. These include having a small, thin tube to drain pee and not being able to control when you pee or poop.  Treatment includes antibiotic medicines for germs.  Drink enough fluid to keep your pee pale yellow. This information is not intended to replace advice given to you by your health care provider. Make sure you discuss any questions you have with your health care provider. Document Revised: 02/04/2018 Document Reviewed: 08/27/2017 Elsevier Patient Education  2020 [employer]  

## 2020-02-01 NOTE — Progress Notes (Signed)
   Subjective:    Patient ID: Sheri Hood, female    DOB: 03-Jun-1965, 54 y.o.   MRN: [id number]  HPI Chief Complaint  Patient presents with  . possible UTI    Pt stated that she has an urgency to go to restroom    This is a 55 yo female who presents with above cc. She  has a past medical history of Allergy, Anemia, Anxiety, Arthritis, Asthma, Depression, Diabetes mellitus without complication ([place]), GERD (gastroesophageal reflux disease), Headache(784.0), Heart murmur, Hiatal hernia, Hyperlipidemia, Hypertension, Recurrent upper respiratory infection (URI), Sleep apnea, and Sleep apnea.  Symptoms started this morning, dysuria, frequency, urgency. No nausea, vomiting, fever, nocturia, back pain, some abdominal pressure with urgency.  Blood sugars running 94- 180. No recent antibiotics.    Review of Systems Per HPI    Objective:   Physical Exam Physical Exam  Constitutional: She is oriented to person, place, and time. She appears well-developed and well-nourished. No distress.  HENT:  Head: Normocephalic and atraumatic.  Cardiovascular: Normal rate, regular rhythm and normal heart sounds.   Pulmonary/Chest: Effort normal and breath sounds normal.  Abdominal: Soft. She exhibits no distension. There is no tenderness. There is no rebound, no guarding and no CVA tenderness.  Neurological: She is alert and oriented to person, place, and time.  Skin: Skin is warm and dry. She is not diaphoretic.  Psychiatric: She has a normal mood and affect. Her behavior is normal. Judgment and thought content normal.  Vitals reviewed.     BP 104/70 (BP Location: Right Arm, Patient Position: Sitting, Cuff Size: Normal)   Pulse 79   Temp 98.6 F (37 C) (Temporal)   Ht 5\' 2"  (1.575 m)   Wt 215 lb (97.5 kg)   SpO2 99%   BMI 39.32 kg/m  Wt Readings from Last 3 Encounters:  02/01/20 215 lb (97.5 kg)  02/14/19 208 lb (94.3 kg)  11/24/18 215 lb (97.5 kg)   Results for orders placed or  performed in visit on 02/01/20  POCT Urinalysis Dipstick (Automated)  Result Value Ref Range   Color, UA     Clarity, UA     Glucose, UA Positive (A) Negative   Bilirubin, UA +    Ketones, UA +    Spec Grav, UA >=1.030 (A) 1.010 - 1.025   Blood, UA small    pH, UA 5.5 5.0 - 8.0   Protein, UA Positive (A) Negative   Urobilinogen, UA 0.2 0.2 or 1.0 E.U./dL   Nitrite, UA neg    Leukocytes, UA Small (1+) (A) Negative       Assessment & Plan:  1. Dysuria - POCT Urinalysis Dipstick (Automated)  2. Cystitis - Provided written and verbal information regarding diagnosis and treatment. - Reviewed ER/UC/ follow up precautions - sulfamethoxazole-trimethoprim (BACTRIM DS) 800-160 MG tablet; Take 1 tablet by mouth 2 (two) times daily.  Dispense: 10 tablet; Refill: 0 - Urine Culture  This visit occurred during the SARS-CoV-2 public health emergency.  Safety protocols were in place, including screening questions prior to the visit, additional usage of staff PPE, and extensive cleaning of exam room while observing appropriate contact time as indicated for disinfecting solutions.    Clarene Reamer, FNP-BC  [facility]  02/01/2020 10:47 AM

## 2020-02-02 ENCOUNTER — Other Ambulatory Visit: Payer: Self-pay | Admitting: Internal Medicine

## 2020-02-03 ENCOUNTER — Other Ambulatory Visit: Payer: Self-pay | Admitting: Internal Medicine

## 2020-02-05 ENCOUNTER — Encounter: Payer: Self-pay | Admitting: Family Medicine

## 2020-02-06 ENCOUNTER — Other Ambulatory Visit: Payer: Self-pay | Admitting: Internal Medicine

## 2020-02-07 LAB — URINE CULTURE

## 2020-02-07 MED ORDER — NITROFURANTOIN MONOHYD MACRO 100 MG PO CAPS: 100.0000 mg | ORAL_CAPSULE | Freq: Two times a day (BID) | ORAL | 0 refills | Status: AC

## 2020-02-07 NOTE — Addendum Note (Signed)
Addended by: Clarene Reamer B on: 02/07/2020 12:49 PM   Modules accepted: Orders

## 2020-02-14 ENCOUNTER — Encounter: Payer: Self-pay | Admitting: Family Medicine

## 2020-02-20 ENCOUNTER — Encounter: Payer: BC Managed Care – PPO | Admitting: Internal Medicine

## 2020-02-28 ENCOUNTER — Encounter: Payer: Self-pay | Admitting: Internal Medicine

## 2020-02-28 ENCOUNTER — Other Ambulatory Visit: Payer: Self-pay

## 2020-02-28 ENCOUNTER — Ambulatory Visit (INDEPENDENT_AMBULATORY_CARE_PROVIDER_SITE_OTHER)
Admission: RE | Admit: 2020-02-28 | Discharge: 2020-02-28 | Disposition: A | Discharge status: Home / Self Care | Payer: BC Managed Care – PPO | Source: Ambulatory Visit | Attending: Internal Medicine | Admitting: Internal Medicine

## 2020-02-28 ENCOUNTER — Ambulatory Visit (INDEPENDENT_AMBULATORY_CARE_PROVIDER_SITE_OTHER): Payer: BC Managed Care – PPO | Admitting: Internal Medicine

## 2020-02-28 VITALS — BP 128/84 | HR 65 | Temp 97.0°F | Ht 63.25 in | Wt 217.0 lb

## 2020-02-28 DIAGNOSIS — N390 Urinary tract infection, site not specified: Secondary | ICD-10-CM | POA: Diagnosis not present

## 2020-02-28 DIAGNOSIS — G8929 Other chronic pain: Secondary | ICD-10-CM

## 2020-02-28 DIAGNOSIS — J4520 Mild intermittent asthma, uncomplicated: Secondary | ICD-10-CM

## 2020-02-28 DIAGNOSIS — M1990 Unspecified osteoarthritis, unspecified site: Secondary | ICD-10-CM

## 2020-02-28 DIAGNOSIS — M25572 Pain in left ankle and joints of left foot: Secondary | ICD-10-CM

## 2020-02-28 DIAGNOSIS — Z0001 Encounter for general adult medical examination with abnormal findings: Secondary | ICD-10-CM | POA: Diagnosis not present

## 2020-02-28 DIAGNOSIS — M79672 Pain in left foot: Secondary | ICD-10-CM | POA: Diagnosis not present

## 2020-02-28 DIAGNOSIS — F419 Anxiety disorder, unspecified: Secondary | ICD-10-CM

## 2020-02-28 DIAGNOSIS — R3989 Other symptoms and signs involving the genitourinary system: Secondary | ICD-10-CM | POA: Diagnosis not present

## 2020-02-28 DIAGNOSIS — M7732 Calcaneal spur, left foot: Secondary | ICD-10-CM | POA: Diagnosis not present

## 2020-02-28 DIAGNOSIS — K219 Gastro-esophageal reflux disease without esophagitis: Secondary | ICD-10-CM

## 2020-02-28 DIAGNOSIS — G4733 Obstructive sleep apnea (adult) (pediatric): Secondary | ICD-10-CM

## 2020-02-28 DIAGNOSIS — E7800 Pure hypercholesterolemia, unspecified: Secondary | ICD-10-CM

## 2020-02-28 DIAGNOSIS — F32A Depression, unspecified: Secondary | ICD-10-CM

## 2020-02-28 DIAGNOSIS — I10 Essential (primary) hypertension: Secondary | ICD-10-CM

## 2020-02-28 DIAGNOSIS — E119 Type 2 diabetes mellitus without complications: Secondary | ICD-10-CM

## 2020-02-28 LAB — POC URINALSYSI DIPSTICK (AUTOMATED)
Bilirubin, UA: NEGATIVE
Blood, UA: NEGATIVE
Glucose, UA: NEGATIVE
Ketones, UA: NEGATIVE
Nitrite, UA: NEGATIVE
Protein, UA: NEGATIVE
Spec Grav, UA: >=1.03 — AB (ref 1.010–1.025)
Urobilinogen, UA: 0.2 E.U./dL
pH, UA: 6 (ref 5.0–8.0)

## 2020-02-28 MED ORDER — ROSUVASTATIN CALCIUM 10 MG PO TABS: 10.0000 mg | ORAL_TABLET | Freq: Every day | ORAL | 1 refills | Status: DC

## 2020-02-28 NOTE — Assessment & Plan Note (Signed)
Continue albuterol as needed 

## 2020-02-28 NOTE — Assessment & Plan Note (Signed)
Encourage CPAP compliance Encouraged weight loss We will monitor

## 2020-02-28 NOTE — Assessment & Plan Note (Signed)
Stable on citalopram Support offered 

## 2020-02-28 NOTE — Assessment & Plan Note (Signed)
C met and lipid profile today Noncompliant with statin therapy Encouraged her to consume a low-fat diet

## 2020-02-28 NOTE — Assessment & Plan Note (Addendum)
Controlled on HCTZ C met today Reinforced DASH diet exercise weight loss We will monitor

## 2020-02-28 NOTE — Progress Notes (Signed)
Subjective:    Patient ID: Sheri Hood, female    DOB: 12/13/1965, 54 y.o.   MRN: [id number]  HPI  Pt presents to the clinic today for her annual exam. She is also due to follow up chronic conditions.  Anxiety and Depression: Chronic, managed on Citalopram. She does not see a therapist.  She denies SI/HI.  Asthma: She denies chronic cough or shortness of breath.  She is using Albuterol as needed.  There are no PFTs on file.  OA: Mainly in her elbows, wrists, knees and ankles. She takes Diclofenac and Tylenol as needed with good relief of symptoms.  GERD: She denies breakthrough on Omeprazole.  There is no upper GI on file.  DM 2: Her last A1C was 9.1%, 07/2019. She is taking Lantus, Oxempic and Glipizide as prescribed. Her sugars range 98-200. She checks her feet routinely.  She follows with endocrinology.  OSA: She averages 7 hours of night with the use of her CPAP. There is no sleep study on file.  HLD: Her last LDL was 116, 07/2019. She is not taking Rosuvastatin as prescribed. She does eat some fried foods.   HTN: Her BP today is 128/84. She is taking HCTZ as prescribed.  ECG from 11/2010 reviewed.  She also reports left foot and ankle pain.  This has been an ongoing issue.  She reports prior fracture of this ankle.  She reports the pain is worse after getting up from sitting for long periods of time or for standing for long periods of time.  She has taken Diclofenac and Tylenol with minimal relief of symptoms.  Flu: 11/2018 Tetanus: 01/2014 Covid: Never Pneumovax: 01/2017 Pap Smear: 01/2017 Mammogram: 12/2019 Colon Screening: 05/2018 Vision Screening: Annually Dentist: Biannually  Diet: She does eat meat.  She consumes more veggies and fruits.  She does eat some fried foods.  She drinks mostly water. Exercise: Walking  Review of Systems      Past Medical History:  Diagnosis Date  . Allergy   . Anemia    s/p ablation for heavy bleeding   . Anxiety   . Arthritis     arhtritis middle back, scolosis  . Asthma   . Depression   . Diabetes mellitus without complication ([place])   . GERD (gastroesophageal reflux disease)   . Headache(784.0)    h/x migraines  . Heart murmur    recent diagnosed- / ehho 12/30/10 report on chart  . Hiatal hernia   . Hyperlipidemia   . Hypertension   . Recurrent upper respiratory infection (URI)    occ sore throat and slightly stuffy- no fever or cough  . Sleep apnea    On CPAP  . Sleep apnea     Current Outpatient Medications  Medication Sig Dispense Refill  . albuterol (VENTOLIN HFA) 108 (90 Base) MCG/ACT inhaler Inhale 1-2 puffs into the lungs every 6 (six) hours as needed for wheezing or shortness of breath. 18 g 0  . B-D UF III MINI PEN NEEDLES 31G X 5 MM MISC USE 1  ONCE DAILY AS DIRECTED 100 each 0  . cetirizine (ZYRTEC) 10 MG tablet Take 10 mg by mouth daily.    . ciclopirox (LOPROX) 0.77 % cream Apply topically 2 (two) times daily. 90 g 2  . ciclopirox (PENLAC) 8 % solution Apply topically at bedtime. Apply over nail and surrounding skin. Apply daily over previous coat. After seven (7) days, may remove with alcohol and continue cycle. 6.6 mL 0  . citalopram (  CELEXA) 40 MG tablet Take 1 tablet by mouth once daily 90 tablet 0  . clindamycin (CLEOCIN-T) 1 % lotion Apply topically daily. 60 mL 1  . Continuous Blood Gluc Receiver (FREESTYLE LIBRE 14 DAY READER) DEVI 1 Device by Does not apply route every morning. 1 each 2  . Continuous Blood Gluc Sensor (FREESTYLE LIBRE 14 DAY SENSOR) MISC 1 Device by Does not apply route every 14 (fourteen) days. 6 each 3  . diclofenac (VOLTAREN) 75 MG EC tablet Take 1 tablet by mouth twice daily 60 tablet 1  . Efinaconazole (JUBLIA) 10 % SOLN Apply to toenails nightly. 8 mL 5  . fluticasone (FLOVENT HFA) 44 MCG/ACT inhaler Inhale 2 puffs into the lungs 2 (two) times daily. 1 Inhaler 0  . glipiZIDE (GLUCOTROL) 10 MG tablet Take 1 tablet (10 mg total) by mouth 2 (two) times daily  before a meal. 60 tablet 0  . glucose blood (BAYER CONTOUR NEXT TEST) test strip Use as instructed to test blood sugar once daily. E11.9 100 each 12  . hydrochlorothiazide (HYDRODIURIL) 25 MG tablet TAKE 1 TABLET BY MOUTH ONCE DAILY IN THE MORNING 90 tablet 0  . ibuprofen (ADVIL,MOTRIN) 200 MG tablet Take 200 mg by mouth every 6 (six) hours as needed. For pain    . Insulin Glargine (LANTUS SOLOSTAR) 100 UNIT/ML Solostar Pen Inject 34 Units into the skin at bedtime. 10 pen 1  . Insulin Pen Needle (B-D UF III MINI PEN NEEDLES) 31G X 5 MM MISC 1 each by Does not apply route daily. 30 each 0  . Microlet Lancets MISC USE AS DIRECTED 1 ONCE DAILY TO TEST BLOOD SUGAR 100 each 0  . mupirocin ointment (BACTROBAN) 2 % Apply 1 application topically daily. 22 g 0  . omeprazole (PRILOSEC) 20 MG capsule Take 20 mg by mouth daily.    . predniSONE (DELTASONE) 10 MG tablet Take 3 tabs on days 1-2, take 2 tabs on days 3-4, take 1 tab on days 5-6 12 tablet 0  . promethazine-dextromethorphan (PROMETHAZINE-DM) 6.25-15 MG/5ML syrup Take 5 mLs by mouth 4 (four) times daily as needed. 118 mL 0  . rosuvastatin (CRESTOR) 10 MG tablet Take 1 tablet by mouth once daily 90 tablet 0  . vitamin E 400 UNIT capsule Take 800 Units by mouth at bedtime.      No current facility-administered medications for this visit.    Allergies  Allergen Reactions  . Benadryl [Diphenhydramine Hcl] Other (See Comments)    Keeps awake, jittery  . Codeine Other (See Comments)    Keeps awake  . Dilaudid [Hydromorphone Hcl] Itching and Nausea Only  . Morphine And Related Itching and Nausea Only    Family History  Problem Relation Age of Onset  . Diabetes Father   . Breast cancer Neg Hx   . Colon cancer Neg Hx   . Esophageal cancer Neg Hx   . Rectal cancer Neg Hx   . Stomach cancer Neg Hx     Social History   Socioeconomic History  . Marital status: Married    Spouse name: Not on file  . Number of children: Not on file  . Years  of education: Not on file  . Highest education level: Not on file  Occupational History  . Not on file  Tobacco Use  . Smoking status: Former Smoker    Packs/day: 0.50    Years: 4.00    Pack years: 2.00    Types: Cigarettes    Quit date:  01/02/1988    Years since quitting: 32.1  . Smokeless tobacco: Never Used  Substance and Sexual Activity  . Alcohol use: Yes    Alcohol/week: 0.0 standard drinks    Comment: Occasional Drink  . Drug use: No  . Sexual activity: Never  Other Topics Concern  . Not on file  Social History Narrative  . Not on file   Social Determinants of Health   Financial Resource Strain: Not on file  Food Insecurity: Not on file  Transportation Needs: Not on file  Physical Activity: Not on file  Stress: Not on file  Social Connections: Not on file  Intimate Partner Violence: Not on file     Constitutional: Denies fever, malaise, fatigue, headache or abrupt weight changes.  HEENT: Denies eye pain, eye redness, ear pain, ringing in the ears, wax buildup, runny nose, nasal congestion, bloody nose, or sore throat. Respiratory: Denies difficulty breathing, shortness of breath, cough or sputum production.   Cardiovascular: Denies chest pain, chest tightness, palpitations or swelling in the hands or feet.  Gastrointestinal: Denies abdominal pain, bloating, constipation, diarrhea or blood in the stool.  GU: Patient reports sensation of bladder pressure.  Denies urgency, frequency, pain with urination, burning sensation, blood in urine, odor or discharge. Musculoskeletal: Patient reports left foot and ankle pain.  Denies decrease in range of motion, difficulty with gait, muscle pain or joint  swelling.  Skin: Denies redness, rashes, lesions or ulcercations.  Neurological: Denies dizziness, difficulty with memory, difficulty with speech or problems with balance and coordination.  Psych: Patient has a history of anxiety and depression.  Denies SI/HI.  No other  specific complaints in a complete review of systems (except as listed in HPI above).  Objective:   Physical Exam   BP 128/84   Pulse 65   Temp (!) 97 F (36.1 C) (Temporal)   Ht 5' 3.25" (1.607 m)   Wt 217 lb (98.4 kg)   SpO2 98%   BMI 38.14 kg/m   Wt Readings from Last 3 Encounters:  02/01/20 215 lb (97.5 kg)  02/14/19 208 lb (94.3 kg)  11/24/18 215 lb (97.5 kg)    General: Appears her stated age, obese in NAD. Skin: Warm, dry and intact. No or ulcerations noted. HEENT: Head: normal shape and size; Eyes: sclera white, no icterus, conjunctiva pink, PERRLA and EOMs intact;  Neck:  Neck supple, trachea midline. No masses, lumps or thyromegaly present.  Cardiovascular: Normal rate and rhythm. S1,S2 noted.  No murmur, rubs or gallops noted. No JVD or BLE edema. No carotid bruits noted. Pulmonary/Chest: Normal effort and positive vesicular breath sounds. No respiratory distress. No wheezes, rales or ronchi noted.  Abdomen: Soft and nontender. Normal bowel sounds. No distention or masses noted. Liver, spleen and kidneys non palpable. Musculoskeletal: Normal flexion, extension and rotation of the left ankle.  No signs of joint swelling.  No pain with palpation of the left ankle today.  No difficulty with gait.  Neurological: Alert and oriented. Cranial nerves II-XII grossly intact. Coordination normal.  Psychiatric: Mood and affect normal. Behavior is normal. Judgment and thought content normal.   BMET    Component Value Date/Time   NA 139 07/05/2019 1327   K 3.7 07/05/2019 1327   CL 98 07/05/2019 1327   CO2 25 07/05/2019 1327   GLUCOSE 170 (H) 07/05/2019 1327   BUN 15 07/05/2019 1327   CREATININE 0.83 07/05/2019 1327   CALCIUM 9.6 07/05/2019 1327   GFRNONAA 81 07/05/2019 1327  GFRAA 94 07/05/2019 1327    Lipid Panel     Component Value Date/Time   CHOL 188 07/05/2019 1327   TRIG 148 07/05/2019 1327   HDL 46 07/05/2019 1327   CHOLHDL 4.1 07/05/2019 1327   LDLCALC 116  (H) 07/05/2019 1327    CBC    Component Value Date/Time   WBC 5.5 07/05/2019 1327   RBC 5.49 (H) 07/05/2019 1327   HGB 15.6 07/05/2019 1327   HCT 47.2 (H) 07/05/2019 1327   PLT 174 07/05/2019 1327   MCV 86 07/05/2019 1327   MCH 28.4 07/05/2019 1327   MCHC 33.1 07/05/2019 1327   RDW 12.7 07/05/2019 1327   LYMPHSABS 1.7 01/12/2017 1324   EOSABS 0.1 01/12/2017 1324   BASOSABS 0.0 01/12/2017 1324    Hgb A1C Lab Results  Component Value Date   HGBA1C 9.1 (H) 07/05/2019          Assessment & Plan:   Preventative Health Maintenance:  She declines flu shot today Tetanus UTD She declines Covid vaccine Pneumovax UTD Pap smear UTD Mammogram UTD Colon screening UTD Encouraged her to consume a balanced diet and exercise regimen Advised her to see an eye doctor and dentist annually She will drop off a copy of the labs done by [facility] 01/2020  Sensation of Bladder Pressure:  Urinalysis: Trace leukocytes We will send urine culture Push fluids  Left Foot/Ankle Pain:  X-ray left foot today X-ray left ankle today Continue Diclofenac and Tylenol as needed  We will follow-up after x-rays, RTC in 6 months, follow-up chronic conditions Nicki Reaper, NP This visit occurred during the SARS-CoV-2 public health emergency.  Safety protocols were in place, including screening questions prior to the visit, additional usage of staff PPE, and extensive cleaning of exam room while observing appropriate contact time as indicated for disinfecting solutions.

## 2020-02-28 NOTE — Assessment & Plan Note (Signed)
Uncontrolled, following with endocrinology She will continue Lantus, Ozempic and glipizide She will continue to monitor sugars Encourage routine eye exams Encourage routine foot exams She declines flu or Covid vaccine Pneumovax UTD

## 2020-02-28 NOTE — Patient Instructions (Signed)
Health Maintenance, Female Adopting a healthy lifestyle and getting preventive care are important in promoting health and wellness. Ask your health care provider about:  The right schedule for you to have regular tests and exams.  Things you can do on your own to prevent diseases and keep yourself healthy. What should I know about diet, weight, and exercise? Eat a healthy diet   Eat a diet that includes plenty of vegetables, fruits, low-fat dairy products, and lean protein.  Do not eat a lot of foods that are high in solid fats, added sugars, or sodium. Maintain a healthy weight Body mass index (BMI) is used to identify weight problems. It estimates body fat based on height and weight. Your health care provider can help determine your BMI and help you achieve or maintain a healthy weight. Get regular exercise Get regular exercise. This is one of the most important things you can do for your health. Most adults should:  Exercise for at least 150 minutes each week. The exercise should increase your heart rate and make you sweat (moderate-intensity exercise).  Do strengthening exercises at least twice a week. This is in addition to the moderate-intensity exercise.  Spend less time sitting. Even light physical activity can be beneficial. Watch cholesterol and blood lipids Have your blood tested for lipids and cholesterol at 52 years of age, then have this test every 5 years. Have your cholesterol levels checked more often if:  Your lipid or cholesterol levels are high.  You are older than 52 years of age.  You are at high risk for heart disease. What should I know about cancer screening? Depending on your health history and family history, you may need to have cancer screening at various ages. This may include screening for:  Breast cancer.  Cervical cancer.  Colorectal cancer.  Skin cancer.  Lung cancer. What should I know about heart disease, diabetes, and high blood  pressure? Blood pressure and heart disease  High blood pressure causes heart disease and increases the risk of stroke. This is more likely to develop in people who have high blood pressure readings, are of African descent, or are overweight.  Have your blood pressure checked: ? Every 3-5 years if you are 18-39 years of age. ? Every year if you are 40 years old or older. Diabetes Have regular diabetes screenings. This checks your fasting blood sugar level. Have the screening done:  Once every three years after age 40 if you are at a normal weight and have a low risk for diabetes.  More often and at a younger age if you are overweight or have a high risk for diabetes. What should I know about preventing infection? Hepatitis B If you have a higher risk for hepatitis B, you should be screened for this virus. Talk with your health care provider to find out if you are at risk for hepatitis B infection. Hepatitis C Testing is recommended for:  Everyone born from 1945 through 1965.  Anyone with known risk factors for hepatitis C. Sexually transmitted infections (STIs)  Get screened for STIs, including gonorrhea and chlamydia, if: ? You are sexually active and are younger than 52 years of age. ? You are older than 52 years of age and your health care provider tells you that you are at risk for this type of infection. ? Your sexual activity has changed since you were last screened, and you are at increased risk for chlamydia or gonorrhea. Ask your health care provider if   you are at risk.  Ask your health care provider about whether you are at high risk for HIV. Your health care provider may recommend a prescription medicine to help prevent HIV infection. If you choose to take medicine to prevent HIV, you should first get tested for HIV. You should then be tested every 3 months for as long as you are taking the medicine. Pregnancy  If you are about to stop having your period (premenopausal) and  you may become pregnant, seek counseling before you get pregnant.  Take 400 to 800 micrograms (mcg) of folic acid every day if you become pregnant.  Ask for birth control (contraception) if you want to prevent pregnancy. Osteoporosis and menopause Osteoporosis is a disease in which the bones lose minerals and strength with aging. This can result in bone fractures. If you are 65 years old or older, or if you are at risk for osteoporosis and fractures, ask your health care provider if you should:  Be screened for bone loss.  Take a calcium or vitamin D supplement to lower your risk of fractures.  Be given hormone replacement therapy (HRT) to treat symptoms of menopause. Follow these instructions at home: Lifestyle  Do not use any products that contain nicotine or tobacco, such as cigarettes, e-cigarettes, and chewing tobacco. If you need help quitting, ask your health care provider.  Do not use street drugs.  Do not share needles.  Ask your health care provider for help if you need support or information about quitting drugs. Alcohol use  Do not drink alcohol if: ? Your health care provider tells you not to drink. ? You are pregnant, may be pregnant, or are planning to become pregnant.  If you drink alcohol: ? Limit how much you use to 0-1 drink a day. ? Limit intake if you are breastfeeding.  Be aware of how much alcohol is in your drink. In the [place] one drink equals one 12 oz bottle of beer (355 mL), one 5 oz glass of wine (148 mL), or one 1 oz glass of hard liquor (44 mL). General instructions  Schedule regular health, dental, and eye exams.  Stay current with your vaccines.  Tell your health care provider if: ? You often feel depressed. ? You have ever been abused or do not feel safe at home. Summary  Adopting a healthy lifestyle and getting preventive care are important in promoting health and wellness.  Follow your health care provider's instructions about healthy  diet, exercising, and getting tested or screened for diseases.  Follow your health care provider's instructions on monitoring your cholesterol and blood pressure. This information is not intended to replace advice given to you by your health care provider. Make sure you discuss any questions you have with your health care provider. Document Revised: 02/10/2018 Document Reviewed: 02/10/2018 Elsevier Patient Education  2020 [employer]  

## 2020-02-28 NOTE — Assessment & Plan Note (Signed)
Stable on omeprazole CBC and C met today We will monitor

## 2020-02-28 NOTE — Assessment & Plan Note (Signed)
Continue Diclofenac and Tylenol Encouraged daily stretching Encouraged weight loss

## 2020-02-29 ENCOUNTER — Encounter: Payer: Self-pay | Admitting: Internal Medicine

## 2020-02-29 DIAGNOSIS — M79672 Pain in left foot: Secondary | ICD-10-CM

## 2020-02-29 LAB — URINE CULTURE
MICRO NUMBER:: 11362859
SPECIMEN QUALITY:: ADEQUATE

## 2020-03-14 ENCOUNTER — Other Ambulatory Visit: Payer: Self-pay | Admitting: Internal Medicine

## 2020-03-15 MED ORDER — ALBUTEROL SULFATE HFA 108 (90 BASE) MCG/ACT IN AERS: 1.0000 | INHALATION_SPRAY | Freq: Four times a day (QID) | RESPIRATORY_TRACT | 0 refills | Status: DC | PRN

## 2020-04-08 ENCOUNTER — Other Ambulatory Visit: Payer: Self-pay | Admitting: Internal Medicine

## 2020-04-09 ENCOUNTER — Other Ambulatory Visit: Payer: Self-pay | Admitting: Internal Medicine

## 2020-04-10 ENCOUNTER — Ambulatory Visit: Payer: Self-pay | Admitting: Podiatry

## 2020-04-10 MED ORDER — HYDROCHLOROTHIAZIDE 25 MG PO TABS: 25.0000 mg | ORAL_TABLET | Freq: Every day | ORAL | 1 refills | Status: DC

## 2020-04-11 ENCOUNTER — Encounter: Payer: Self-pay | Admitting: Internal Medicine

## 2020-05-03 ENCOUNTER — Encounter: Payer: Self-pay | Admitting: Internal Medicine

## 2020-05-03 ENCOUNTER — Ambulatory Visit: Payer: BC Managed Care – PPO | Admitting: Internal Medicine

## 2020-05-03 ENCOUNTER — Other Ambulatory Visit: Payer: Self-pay

## 2020-05-03 VITALS — BP 124/84 | HR 81 | Temp 97.8°F | Wt 216.0 lb

## 2020-05-03 DIAGNOSIS — N907 Vulvar cyst: Secondary | ICD-10-CM

## 2020-05-03 MED ORDER — CITALOPRAM HYDROBROMIDE 40 MG PO TABS: 40.0000 mg | ORAL_TABLET | Freq: Every day | ORAL | 1 refills | Status: DC

## 2020-05-03 NOTE — Patient Instructions (Signed)
Epidermoid Cyst  An epidermoid cyst, also called an epidermal cyst, is a small lump under your skin. The cyst contains a substance called keratin. Do not try to pop or open the cyst yourself. What are the causes?  A blocked hair follicle.  A hair that curls and re-enters the skin instead of growing straight out of the skin.  A blocked pore.  Irritated skin.  An injury to the skin.  Certain conditions that are passed along from parent to child.  Human papillomavirus (HPV). This happens rarely when cysts occur on the bottom of the feet.  Long-term sun damage to the skin. What increases the risk?  Having acne.  Being female.  Having an injury to the skin.  Being past puberty.  Having certain conditions caused by genes (genetic disorder) What are the signs or symptoms? These cysts are usually harmless, but they can get infected. Symptoms of infection may include:  Redness.  Inflammation.  Tenderness.  Warmth.  Fever.  A bad-smelling substance that drains from the cyst.  Pus that drains from the cyst. How is this treated? In many cases, epidermoid cysts go away on their own without treatment. If a cyst becomes infected, treatment may include:  Opening and draining the cyst, done by a doctor. After draining, you may need minor surgery to remove the rest of the cyst.  Antibiotic medicine.  Shots of medicines (steroids) that help to reduce inflammation.  Surgery to remove the cyst. Surgery may be done if the cyst: ? Becomes large. ? Bothers you. ? Has a chance of turning into cancer.  Do not try to open a cyst yourself. Follow these instructions at home: Medicines  Take over-the-counter and prescription medicines as told by your doctor.  If you were prescribed an antibiotic medicine, take it as told by your doctor. Do not stop taking it even if you start to feel better. General instructions  Keep the area around your cyst clean and dry.  Wear loose, dry  clothing.  Avoid touching your cyst.  Check your cyst every day for signs of infection. Check for: ? Redness, swelling, or pain. ? Fluid or blood. ? Warmth. ? Pus or a bad smell.  Keep all follow-up visits. How is this prevented?  Wear clean, dry, clothing.  Avoid wearing tight clothing.  Keep your skin clean and dry. Take showers or baths every day. Contact a doctor if:  Your cyst has symptoms of infection.  Your condition does not improve or gets worse.  You have a cyst that looks different from other cysts you have had.  You have a fever. Get help right away if:  Redness spreads from the cyst into the area close by. Summary  An epidermoid cyst is a small lump under your skin.  If a cyst becomes infected, treatment may include surgery to open and drain the cyst, or to remove it.  Take over-the-counter and prescription medicines only as told by your doctor.  Contact a doctor if your condition is not improving or is getting worse.  Keep all follow-up visits. This information is not intended to replace advice given to you by your health care provider. Make sure you discuss any questions you have with your health care provider. Document Revised: 05/25/2019 Document Reviewed: 05/25/2019 Elsevier Patient Education  2021 [employer] Inc.  

## 2020-05-03 NOTE — Progress Notes (Signed)
Subjective:    Patient ID: Sheri Hood, female    DOB: February 26, 1966, 55 y.o.   MRN: [id number]  HPI  Pt presents to the clinic today with c/o bumps on her vaginal area. She noticed this. She reports the areas are tender but she has not noticed any redness or discharge from the area. She reports she was advised to change to [facility] soap which has not helped. She reports she has had to have these surgically removed in the past.  Review of Systems      Past Medical History:  Diagnosis Date  . Allergy   . Anemia    s/p ablation for heavy bleeding   . Anxiety   . Arthritis    arhtritis middle back, scolosis  . Asthma   . Depression   . Diabetes mellitus without complication ([place])   . GERD (gastroesophageal reflux disease)   . Headache(784.0)    h/x migraines  . Heart murmur    recent diagnosed- / ehho 12/30/10 report on chart  . Hiatal hernia   . Hyperlipidemia   . Hypertension   . Recurrent upper respiratory infection (URI)    occ sore throat and slightly stuffy- no fever or cough  . Sleep apnea    On CPAP  . Sleep apnea     Current Outpatient Medications  Medication Sig Dispense Refill  . albuterol (VENTOLIN HFA) 108 (90 Base) MCG/ACT inhaler Inhale 1-2 puffs into the lungs every 6 (six) hours as needed for wheezing or shortness of breath. 18 g 0  . B-D UF III MINI PEN NEEDLES 31G X 5 MM MISC USE 1  ONCE DAILY AS DIRECTED 100 each 0  . cetirizine (ZYRTEC) 10 MG tablet Take 10 mg by mouth daily.    . ciclopirox (LOPROX) 0.77 % cream Apply topically 2 (two) times daily. 90 g 2  . ciclopirox (PENLAC) 8 % solution Apply topically at bedtime. Apply over nail and surrounding skin. Apply daily over previous coat. After seven (7) days, may remove with alcohol and continue cycle. 6.6 mL 0  . citalopram (CELEXA) 40 MG tablet Take 1 tablet by mouth once daily 90 tablet 0  . clindamycin (CLEOCIN-T) 1 % lotion Apply topically daily. 60 mL 1  . Continuous Blood Gluc Receiver  (FREESTYLE LIBRE 14 DAY READER) DEVI 1 Device by Does not apply route every morning. 1 each 2  . Continuous Blood Gluc Sensor (FREESTYLE LIBRE 14 DAY SENSOR) MISC 1 Device by Does not apply route every 14 (fourteen) days. 6 each 3  . diclofenac (VOLTAREN) 75 MG EC tablet Take 1 tablet by mouth twice daily 60 tablet 1  . Efinaconazole (JUBLIA) 10 % SOLN Apply to toenails nightly. 8 mL 5  . fluticasone (FLOVENT HFA) 44 MCG/ACT inhaler Inhale 2 puffs into the lungs 2 (two) times daily. 1 Inhaler 0  . glipiZIDE (GLUCOTROL) 10 MG tablet Take 1 tablet (10 mg total) by mouth 2 (two) times daily before a meal. 60 tablet 0  . glucose blood (BAYER CONTOUR NEXT TEST) test strip Use as instructed to test blood sugar once daily. E11.9 100 each 12  . hydrochlorothiazide (HYDRODIURIL) 25 MG tablet Take 1 tablet (25 mg total) by mouth daily. 90 tablet 1  . ibuprofen (ADVIL,MOTRIN) 200 MG tablet Take 200 mg by mouth every 6 (six) hours as needed. For pain    . Insulin Glargine (LANTUS SOLOSTAR) 100 UNIT/ML Solostar Pen Inject 34 Units into the skin at bedtime. 10 pen 1  .  Insulin Pen Needle (B-D UF III MINI PEN NEEDLES) 31G X 5 MM MISC 1 each by Does not apply route daily. 30 each 0  . Microlet Lancets MISC USE AS DIRECTED 1 ONCE DAILY TO TEST BLOOD SUGAR 100 each 0  . mupirocin ointment (BACTROBAN) 2 % Apply 1 application topically daily. 22 g 0  . omeprazole (PRILOSEC) 20 MG capsule Take 20 mg by mouth daily.    . rosuvastatin (CRESTOR) 10 MG tablet Take 1 tablet (10 mg total) by mouth daily. 90 tablet 1  . vitamin E 400 UNIT capsule Take 800 Units by mouth at bedtime.     No current facility-administered medications for this visit.    Allergies  Allergen Reactions  . Benadryl [Diphenhydramine Hcl] Other (See Comments)    Keeps awake, jittery  . Codeine Other (See Comments)    Keeps awake  . Dilaudid [Hydromorphone Hcl] Itching and Nausea Only  . Morphine And Related Itching and Nausea Only    Family  History  Problem Relation Age of Onset  . Diabetes Father   . Breast cancer Neg Hx   . Colon cancer Neg Hx   . Esophageal cancer Neg Hx   . Rectal cancer Neg Hx   . Stomach cancer Neg Hx     Social History   Socioeconomic History  . Marital status: Married    Spouse name: Not on file  . Number of children: Not on file  . Years of education: Not on file  . Highest education level: Not on file  Occupational History  . Not on file  Tobacco Use  . Smoking status: Former Smoker    Packs/day: 0.50    Years: 4.00    Pack years: 2.00    Types: Cigarettes    Quit date: 01/02/1988    Years since quitting: 32.3  . Smokeless tobacco: Never Used  Substance and Sexual Activity  . Alcohol use: Yes    Alcohol/week: 0.0 standard drinks    Comment: Occasional Drink  . Drug use: No  . Sexual activity: Never  Other Topics Concern  . Not on file  Social History Narrative  . Not on file   Social Determinants of Health   Financial Resource Strain: Not on file  Food Insecurity: Not on file  Transportation Needs: Not on file  Physical Activity: Not on file  Stress: Not on file  Social Connections: Not on file  Intimate Partner Violence: Not on file     Constitutional: Denies fever, malaise, fatigue, headache or abrupt weight changes.  Respiratory: Denies difficulty breathing, shortness of breath, cough or sputum production.   Cardiovascular: Denies chest pain, chest tightness, palpitations or swelling in the hands or feet.  Gastrointestinal: Denies abdominal pain, bloating, constipation, diarrhea or blood in the stool.  Skin: Pt reports bumps in the vaginal area. Denies redness, rashes, or ulcercations.   No other specific complaints in a complete review of systems (except as listed in HPI above).  Objective:   Physical Exam   BP 124/84   Pulse 81   Temp 97.8 F (36.6 C) (Temporal)   Wt 216 lb (98 kg)   SpO2 98%   BMI 37.96 kg/m   Wt Readings from Last 3 Encounters:   02/28/20 217 lb (98.4 kg)  02/01/20 215 lb (97.5 kg)  02/14/19 208 lb (94.3 kg)    General: Appears her stated age, obese, in NAD. Skin: Multiple sebaceous cyst noted of bilateral labia. Cardiovascular: Normal rate. Pulmonary/Chest: Normal effort  and positive vesicular breath sounds. No respiratory distress. No wheezes, rales or ronchi noted.  Neurological: Alert and oriented.  EKG:  BMET    Component Value Date/Time   NA 139 07/05/2019 1327   K 3.7 07/05/2019 1327   CL 98 07/05/2019 1327   CO2 25 07/05/2019 1327   GLUCOSE 170 (H) 07/05/2019 1327   BUN 15 07/05/2019 1327   CREATININE 0.83 07/05/2019 1327   CALCIUM 9.6 07/05/2019 1327   GFRNONAA 81 07/05/2019 1327   GFRAA 94 07/05/2019 1327    Lipid Panel     Component Value Date/Time   CHOL 188 07/05/2019 1327   TRIG 148 07/05/2019 1327   HDL 46 07/05/2019 1327   CHOLHDL 4.1 07/05/2019 1327   LDLCALC 116 (H) 07/05/2019 1327    CBC    Component Value Date/Time   WBC 5.5 07/05/2019 1327   RBC 5.49 (H) 07/05/2019 1327   HGB 15.6 07/05/2019 1327   HCT 47.2 (H) 07/05/2019 1327   PLT 174 07/05/2019 1327   MCV 86 07/05/2019 1327   MCH 28.4 07/05/2019 1327   MCHC 33.1 07/05/2019 1327   RDW 12.7 07/05/2019 1327   LYMPHSABS 1.7 01/12/2017 1324   EOSABS 0.1 01/12/2017 1324   BASOSABS 0.0 01/12/2017 1324    Hgb A1C Lab Results  Component Value Date   HGBA1C 9.1 (H) 07/05/2019           Assessment & Plan:   Sebaceous Cyst of Labia:  Advised her these are benign, mainly a cosmetic issue She could see GYN to see if they would remove them, but would only do this if they were painful No further intervention needed at this time- will monitor  Return precautions discussed  Webb Silversmith, NP This visit occurred during the SARS-CoV-2 public health emergency.  Safety protocols were in place, including screening questions prior to the visit, additional usage of staff PPE, and extensive cleaning of exam room while  observing appropriate contact time as indicated for disinfecting solutions.

## 2020-06-08 ENCOUNTER — Other Ambulatory Visit: Payer: Self-pay | Admitting: Internal Medicine

## 2020-06-08 MED ORDER — BD PEN NEEDLE MINI U/F 31G X 5 MM MISC: 1.0000 | Freq: Two times a day (BID) | 2 refills | Status: DC

## 2020-06-11 ENCOUNTER — Other Ambulatory Visit: Payer: Self-pay | Admitting: Internal Medicine

## 2020-06-22 ENCOUNTER — Telehealth: Payer: Self-pay

## 2020-06-22 ENCOUNTER — Encounter: Payer: Self-pay | Admitting: Internal Medicine

## 2020-06-22 NOTE — Telephone Encounter (Signed)
Was supposed to go to Endo per pt, disregard

## 2020-06-22 NOTE — Telephone Encounter (Signed)
Pt left v/m that she received a letter from [facility] that they no longer cover lantus. [facility] will pay for [facility] and pt request [facility] rx sent to [address].

## 2020-07-11 ENCOUNTER — Other Ambulatory Visit: Payer: Self-pay | Admitting: Internal Medicine

## 2020-08-12 ENCOUNTER — Other Ambulatory Visit: Payer: Self-pay | Admitting: Internal Medicine

## 2020-08-13 NOTE — Telephone Encounter (Signed)
Refill request Diclofenac Last refill 07/12/20 #60 Last office visit 05/03/20 No upcoming appointment scheduled

## 2020-08-28 ENCOUNTER — Encounter: Payer: Self-pay | Admitting: Family Medicine

## 2020-08-28 ENCOUNTER — Telehealth (INDEPENDENT_AMBULATORY_CARE_PROVIDER_SITE_OTHER): Payer: BC Managed Care – PPO | Admitting: Family Medicine

## 2020-08-28 ENCOUNTER — Other Ambulatory Visit: Payer: Self-pay

## 2020-08-28 ENCOUNTER — Encounter: Payer: Self-pay | Admitting: Internal Medicine

## 2020-08-28 ENCOUNTER — Other Ambulatory Visit: Payer: Self-pay | Admitting: Family Medicine

## 2020-08-28 VITALS — Ht 63.0 in | Wt 215.0 lb

## 2020-08-28 DIAGNOSIS — B349 Viral infection, unspecified: Secondary | ICD-10-CM

## 2020-08-28 DIAGNOSIS — R509 Fever, unspecified: Secondary | ICD-10-CM | POA: Diagnosis not present

## 2020-08-28 DIAGNOSIS — R059 Cough, unspecified: Secondary | ICD-10-CM | POA: Diagnosis not present

## 2020-08-28 DIAGNOSIS — R52 Pain, unspecified: Secondary | ICD-10-CM | POA: Diagnosis not present

## 2020-08-28 MED ORDER — PREDNISONE 20 MG PO TABS: ORAL_TABLET | ORAL | 0 refills | Status: DC

## 2020-08-28 MED ORDER — HYDROCOD POLST-CPM POLST ER 10-8 MG/5ML PO SUER
5.0000 mL | Freq: Two times a day (BID) | ORAL | 0 refills | Status: DC | PRN
Start: 2020-08-28 — End: 2020-08-29

## 2020-08-28 NOTE — Patient Instructions (Addendum)
Start Prednisone 7 day taper Tussionex cough syrup, tolerates codeine well, she requested this option has worked before Symptom control OTC for cough congestion Improve hydration, nutrition  Still suspect COVID, use precautions quarantine, work note  Please schedule a Follow-up Appointment to: Return in about 1 week (around 09/04/2020), or if symptoms worsen or fail to improve, for acute respiratory illness.  If you have any other questions or concerns, please feel free to call the office or send a message through [place]. You may also schedule an earlier appointment if necessary.  Additionally, you may be receiving a survey about your experience at our office within a few days to 1 week by e-mail or mail. We value your feedback.  Nobie Putnam, DO [place]

## 2020-08-28 NOTE — Progress Notes (Signed)
Virtual Visit via Telephone The purpose of this virtual visit is to provide medical care while limiting exposure to the novel coronavirus (COVID19) for both patient and office staff.  Consent was obtained for phone visit:  Yes.   Answered questions that patient had about telehealth interaction:  Yes.   I discussed the limitations, risks, security and privacy concerns of performing an evaluation and management service by telephone. I also discussed with the patient that there may be a patient responsible charge related to this service. The patient expressed understanding and agreed to proceed.  Patient Location: Home Provider Location: [facility] (Office)  Participants in virtual visit: - Patient: Sheri Hood - CMA: Orinda Kenner, [place] - Provider: Dr Parks Ranger  ---------------------------------------------------------------------- Chief Complaint  Patient presents with   Cough   Sore Throat   Emesis   Fever    S: Reviewed CMA documentation. I have called patient and gathered additional HPI as follows:  Cough, Sore Throat Fever, Body Aches Reports that symptoms started 08/26/20 PM with cough, then next day still had cough and developed body aches, congestion, ear pressure and pain, had initial nausea vomiting and now today body aches have improved some. Reduced appetite. No further vomiting. She did COVID Home test yesterday Sunday 6/26 and one yesterday 6/27 - Tried OTC Tylenol, Allegra, Mucinex. - Inhaler. Daughter had recent URI with cough congestion. No fever. She had COVID 01/2019. She is unvaccinated against COVID  PMH Asthma, OSA CPAP, Diabetes, HTN, HLD.  Currently out of work since Monday 08/27/20  Denies any known or suspected exposure to person with or possibly with [place].  Denies any sinus pain or pressure, headache, abdominal pain, diarrhea  Past Medical History:  Diagnosis Date   Allergy    Anemia    s/p ablation for heavy bleeding     Anxiety    Arthritis    arhtritis middle back, scolosis   Asthma    Depression    Diabetes mellitus without complication ([place])    GERD (gastroesophageal reflux disease)    Headache(784.0)    h/x migraines   Heart murmur    recent diagnosed- / ehho 12/30/10 report on chart   Hiatal hernia    Hyperlipidemia    Hypertension    Recurrent upper respiratory infection (URI)    occ sore throat and slightly stuffy- no fever or cough   Sleep apnea    On CPAP   Sleep apnea    Social History   Tobacco Use   Smoking status: Former    Packs/day: 0.50    Years: 4.00    Pack years: 2.00    Types: Cigarettes    Quit date: 01/02/1988    Years since quitting: 32.6   Smokeless tobacco: Never  Substance Use Topics   Alcohol use: Yes    Alcohol/week: 0.0 standard drinks    Comment: Occasional Drink   Drug use: No    Current Outpatient Medications:    albuterol (VENTOLIN HFA) 108 (90 Base) MCG/ACT inhaler, Inhale 1-2 puffs into the lungs every 6 (six) hours as needed for wheezing or shortness of breath., Disp: 18 g, Rfl: 0   cetirizine (ZYRTEC) 10 MG tablet, Take 10 mg by mouth daily., Disp: , Rfl:    chlorpheniramine-HYDROcodone (TUSSIONEX PENNKINETIC ER) 10-8 MG/5ML SUER, Take 5 mLs by mouth every 12 (twelve) hours as needed for cough., Disp: 115 mL, Rfl: 0   ciclopirox (LOPROX) 0.77 % cream, Apply topically 2 (two) times daily., Disp: 90  g, Rfl: 2   ciclopirox (PENLAC) 8 % solution, Apply topically at bedtime. Apply over nail and surrounding skin. Apply daily over previous coat. After seven (7) days, may remove with alcohol and continue cycle., Disp: 6.6 mL, Rfl: 0   citalopram (CELEXA) 40 MG tablet, Take 1 tablet (40 mg total) by mouth daily., Disp: 90 tablet, Rfl: 1   clindamycin (CLEOCIN-T) 1 % lotion, Apply topically daily., Disp: 60 mL, Rfl: 1   Continuous Blood Gluc Receiver (FREESTYLE LIBRE 14 DAY READER) DEVI, 1 Device by Does not apply route every morning., Disp: 1 each, Rfl:  2   Continuous Blood Gluc Sensor (FREESTYLE LIBRE 14 DAY SENSOR) MISC, 1 Device by Does not apply route every 14 (fourteen) days., Disp: 6 each, Rfl: 3   diclofenac (VOLTAREN) 75 MG EC tablet, Take 1 tablet by mouth twice daily, Disp: 60 tablet, Rfl: 0   Efinaconazole (JUBLIA) 10 % SOLN, Apply to toenails nightly., Disp: 8 mL, Rfl: 5   fluticasone (FLOVENT HFA) 44 MCG/ACT inhaler, Inhale 2 puffs into the lungs 2 (two) times daily., Disp: 1 Inhaler, Rfl: 0   glipiZIDE (GLUCOTROL) 10 MG tablet, Take 1 tablet (10 mg total) by mouth 2 (two) times daily before a meal., Disp: 60 tablet, Rfl: 0   glucose blood (BAYER CONTOUR NEXT TEST) test strip, Use as instructed to test blood sugar once daily. E11.9, Disp: 100 each, Rfl: 12   hydrochlorothiazide (HYDRODIURIL) 25 MG tablet, Take 1 tablet (25 mg total) by mouth daily., Disp: 90 tablet, Rfl: 1   ibuprofen (ADVIL,MOTRIN) 200 MG tablet, Take 200 mg by mouth every 6 (six) hours as needed. For pain, Disp: , Rfl:    Insulin Glargine (LANTUS SOLOSTAR) 100 UNIT/ML Solostar Pen, Inject 34 Units into the skin at bedtime., Disp: 10 pen, Rfl: 1   Insulin Pen Needle (B-D UF III MINI PEN NEEDLES) 31G X 5 MM MISC, 1 each by Does not apply route daily., Disp: 30 each, Rfl: 0   Insulin Pen Needle (B-D UF III MINI PEN NEEDLES) 31G X 5 MM MISC, 1 each by Other route in the morning and at bedtime., Disp: 100 each, Rfl: 2   Microlet Lancets MISC, USE AS DIRECTED 1 ONCE DAILY TO TEST BLOOD SUGAR, Disp: 100 each, Rfl: 0   mupirocin ointment (BACTROBAN) 2 %, Apply 1 application topically daily., Disp: 22 g, Rfl: 0   omeprazole (PRILOSEC) 20 MG capsule, Take 20 mg by mouth daily., Disp: , Rfl:    predniSONE (DELTASONE) 20 MG tablet, Take daily with food. Start with 60mg  (3 pills) x 2 days, then reduce to 40mg  (2 pills) x 2 days, then 20mg  (1 pill) x 3 days, Disp: 13 tablet, Rfl: 0   rosuvastatin (CRESTOR) 10 MG tablet, Take 1 tablet (10 mg total) by mouth daily., Disp: 90 tablet,  Rfl: 1   vitamin E 400 UNIT capsule, Take 800 Units by mouth at bedtime., Disp: , Rfl:   Depression screen [facility] 2/9 02/28/2020 02/01/2020 02/14/2019  Decreased Interest 0 0 0  Down, Depressed, Hopeless 0 0 0  PHQ - 2 Score 0 0 0  Altered sleeping - - 0  Tired, decreased energy - - 0  Change in appetite - - 0  Feeling bad or failure about yourself  - - 0  Trouble concentrating - - 0  Moving slowly or fidgety/restless - - 0  Suicidal thoughts - - 0  PHQ-9 Score - - 0  Difficult doing work/chores - - Not difficult  at all    GAD 7 : Generalized Anxiety Score 02/01/2020 02/14/2019 07/23/2018  Nervous, Anxious, on Edge 0 0 0  Control/stop worrying 0 0 0  Worry too much - different things 0 0 0  Trouble relaxing 0 0 0  Restless 0 0 0  Easily annoyed or irritable 0 0 0  Afraid - awful might happen 0 0 0  Total GAD 7 Score 0 0 0  Anxiety Difficulty Not difficult at all Not difficult at all Not difficult at all    -------------------------------------------------------------------------- O: No physical exam performed due to remote telephone encounter.  Lab results reviewed.  No results found for this or any previous visit (from the past 2160 hour(s)).  -------------------------------------------------------------------------- A&P:  Problem List Items Addressed This Visit   None Visit Diagnoses     Acute viral syndrome    -  Primary   Relevant Medications   chlorpheniramine-HYDROcodone (TUSSIONEX PENNKINETIC ER) 10-8 MG/5ML SUER   predniSONE (DELTASONE) 20 MG tablet   Body aches       Fever and chills       Cough       Relevant Medications   chlorpheniramine-HYDROcodone (TUSSIONEX PENNKINETIC ER) 10-8 MG/5ML SUER   predniSONE (DELTASONE) 20 MG tablet      Acute viral syndrome Symptom 1st onset 08/26/20 Home COVID tests negative 6/26 and 6/27 Mild to moderate symptoms currently. No red flags or dyspnea High risk factors with obesity, Asthma, DM, HTN   Start Prednisone 7  day taper Tussionex cough syrup, tolerates codeine well, she requested this option has worked before Symptom control OTC for cough congestion Improve hydration, nutrition  Still suspect COVID, use precautions quarantine, work note  Could also be other viral flu vs adenovirus - we agree to use steroid and symptom relief first and repeat test if indicated.   Follow-up criteria given.    Meds ordered this encounter  Medications   chlorpheniramine-HYDROcodone (TUSSIONEX PENNKINETIC ER) 10-8 MG/5ML SUER    Sig: Take 5 mLs by mouth every 12 (twelve) hours as needed for cough.    Dispense:  115 mL    Refill:  0   predniSONE (DELTASONE) 20 MG tablet    Sig: Take daily with food. Start with 60mg  (3 pills) x 2 days, then reduce to 40mg  (2 pills) x 2 days, then 20mg  (1 pill) x 3 days    Dispense:  13 tablet    Refill:  0    Follow-up: - Return in 1 week if unresolved  Patient verbalizes understanding with the above medical recommendations including the limitation of remote medical advice.  Specific follow-up and call-back criteria were given for patient to follow-up or seek medical care more urgently if needed.   - Time spent in direct consultation with patient on phone: 15 minutes   Nobie Putnam, [facility] 08/28/2020, 4:28 PM

## 2020-08-29 DIAGNOSIS — R059 Cough, unspecified: Secondary | ICD-10-CM

## 2020-08-29 DIAGNOSIS — J0110 Acute frontal sinusitis, unspecified: Secondary | ICD-10-CM

## 2020-08-29 MED ORDER — AMOXICILLIN-POT CLAVULANATE 875-125 MG PO TABS: 1.0000 | ORAL_TABLET | Freq: Two times a day (BID) | ORAL | 0 refills | Status: DC

## 2020-08-29 MED ORDER — PSEUDOEPH-BROMPHEN-DM 30-2-10 MG/5ML PO SYRP
5.0000 mL | ORAL_SOLUTION | Freq: Four times a day (QID) | ORAL | 0 refills | Status: DC | PRN
Start: 2020-08-29 — End: 2020-11-16

## 2020-08-29 NOTE — Telephone Encounter (Signed)
   Notes to clinic:  We do not have this in stock, Please change.   Requested Prescriptions  Pending Prescriptions Disp Refills   chlorpheniramine-HYDROcodone ([place]) 10-8 MG/5ML [place] [Pharmacy Med Name: HYD POL/CPM 10-8/5ML SUS] 115 mL 0    Sig: TAKE 5 ML BY MOUTH  EVERY 12 HOURS AS NEEDED FOR COUGH      Off-Protocol Failed - 08/28/2020  4:49 PM      Failed - Medication not assigned to a protocol, review manually.      Passed - Valid encounter within last 12 months    Recent Outpatient Visits           Yesterday Acute viral syndrome   [place], Devonne Doughty, [place]

## 2020-08-29 NOTE — Telephone Encounter (Signed)
I believe you saw this patient

## 2020-08-30 DIAGNOSIS — U071 COVID-19: Secondary | ICD-10-CM

## 2020-08-30 DIAGNOSIS — Z20822 Contact with and (suspected) exposure to covid-19: Secondary | ICD-10-CM | POA: Diagnosis not present

## 2020-08-30 MED ORDER — NIRMATRELVIR/RITONAVIR (PAXLOVID)TABLET: 3.0000 | ORAL_TABLET | Freq: Two times a day (BID) | ORAL | 0 refills | Status: AC

## 2020-09-06 ENCOUNTER — Telehealth (INDEPENDENT_AMBULATORY_CARE_PROVIDER_SITE_OTHER): Payer: BC Managed Care – PPO | Admitting: Internal Medicine

## 2020-09-06 ENCOUNTER — Other Ambulatory Visit: Payer: Self-pay

## 2020-09-06 ENCOUNTER — Encounter: Payer: Self-pay | Admitting: Internal Medicine

## 2020-09-06 DIAGNOSIS — E119 Type 2 diabetes mellitus without complications: Secondary | ICD-10-CM

## 2020-09-06 DIAGNOSIS — U071 COVID-19: Secondary | ICD-10-CM | POA: Diagnosis not present

## 2020-09-06 DIAGNOSIS — Z0289 Encounter for other administrative examinations: Secondary | ICD-10-CM | POA: Diagnosis not present

## 2020-09-06 NOTE — Progress Notes (Signed)
Virtual Visit via Video Note  I connected with Sheri Hood on 09/06/20 at 11:20 AM EDT by a video enabled telemedicine application and verified that I am speaking with the correct person using two identifiers.  Location: Patient: Home Provider: Office  Person's participating in this video call: Webb Silversmith, NP and Noni Saupe.   I discussed the limitations of evaluation and management by telemedicine and the availability of in person appointments. The patient expressed understanding and agreed to proceed.  History of Present Illness:  Patient reports headache, cough, nausea and vomiting.  She reports this started on 26 June.  She reports she tested positive for COVID on 30 June.  She was prescribed Paxil AVID by Dr. Parks Ranger but did not take this medication due to concern for side effects.  She reports the headache is located in her forehead.  She describes the pain as pressure.  She reports associated intermittent lightheadedness but denies visual changes or syncope.  The cough is dry and nonproductive.  The nausea and vomiting seem to have improved.  She denies runny nose, nasal congestion, ear pain, sore throat or shortness of breath.  She denies fever, chills or body aches but is still very fatigued.  She has taken Dramamine and cough syrup OTC with some relief of symptoms.  She will need an extension to go back to work on July 11.  Past Medical History:  Diagnosis Date   Allergy    Anemia    s/p ablation for heavy bleeding    Anxiety    Arthritis    arhtritis middle back, scolosis   Asthma    Depression    Diabetes mellitus without complication ([place])    GERD (gastroesophageal reflux disease)    Headache(784.0)    h/x migraines   Heart murmur    recent diagnosed- / [facility] 12/30/10 report on chart   Hiatal hernia    Hyperlipidemia    Hypertension    Recurrent upper respiratory infection (URI)    occ sore throat and slightly stuffy- no fever or cough   Sleep apnea     On CPAP   Sleep apnea     Current Outpatient Medications  Medication Sig Dispense Refill   albuterol (VENTOLIN HFA) 108 (90 Base) MCG/ACT inhaler Inhale 1-2 puffs into the lungs every 6 (six) hours as needed for wheezing or shortness of breath. 18 g 0   amoxicillin-clavulanate (AUGMENTIN) 875-125 MG tablet Take 1 tablet by mouth 2 (two) times daily. 20 tablet 0   brompheniramine-pseudoephedrine-DM 30-2-10 MG/5ML syrup Take 5 mLs by mouth 4 (four) times daily as needed. 236 mL 0   cetirizine (ZYRTEC) 10 MG tablet Take 10 mg by mouth daily.     ciclopirox (LOPROX) 0.77 % cream Apply topically 2 (two) times daily. 90 g 2   ciclopirox (PENLAC) 8 % solution Apply topically at bedtime. Apply over nail and surrounding skin. Apply daily over previous coat. After seven (7) days, may remove with alcohol and continue cycle. 6.6 mL 0   citalopram (CELEXA) 40 MG tablet Take 1 tablet (40 mg total) by mouth daily. 90 tablet 1   clindamycin (CLEOCIN-T) 1 % lotion Apply topically daily. 60 mL 1   Continuous Blood Gluc Receiver (FREESTYLE LIBRE 14 DAY READER) DEVI 1 Device by Does not apply route every morning. 1 each 2   Continuous Blood Gluc Sensor (FREESTYLE LIBRE 14 DAY SENSOR) MISC 1 Device by Does not apply route every 14 (fourteen) days. 6 each 3  diclofenac (VOLTAREN) 75 MG EC tablet Take 1 tablet by mouth twice daily 60 tablet 0   Efinaconazole (JUBLIA) 10 % SOLN Apply to toenails nightly. 8 mL 5   fluticasone (FLOVENT HFA) 44 MCG/ACT inhaler Inhale 2 puffs into the lungs 2 (two) times daily. 1 Inhaler 0   glipiZIDE (GLUCOTROL) 10 MG tablet Take 1 tablet (10 mg total) by mouth 2 (two) times daily before a meal. 60 tablet 0   glucose blood (BAYER CONTOUR NEXT TEST) test strip Use as instructed to test blood sugar once daily. E11.9 100 each 12   hydrochlorothiazide (HYDRODIURIL) 25 MG tablet Take 1 tablet (25 mg total) by mouth daily. 90 tablet 1   ibuprofen (ADVIL,MOTRIN) 200 MG tablet Take 200 mg by  mouth every 6 (six) hours as needed. For pain     Insulin Glargine (LANTUS SOLOSTAR) 100 UNIT/ML Solostar Pen Inject 34 Units into the skin at bedtime. 10 pen 1   Insulin Pen Needle (B-D UF III MINI PEN NEEDLES) 31G X 5 MM MISC 1 each by Does not apply route daily. 30 each 0   Insulin Pen Needle (B-D UF III MINI PEN NEEDLES) 31G X 5 MM MISC 1 each by Other route in the morning and at bedtime. 100 each 2   Microlet Lancets MISC USE AS DIRECTED 1 ONCE DAILY TO TEST BLOOD SUGAR 100 each 0   mupirocin ointment (BACTROBAN) 2 % Apply 1 application topically daily. 22 g 0   omeprazole (PRILOSEC) 20 MG capsule Take 20 mg by mouth daily.     predniSONE (DELTASONE) 20 MG tablet Take daily with food. Start with 60mg  (3 pills) x 2 days, then reduce to 40mg  (2 pills) x 2 days, then 20mg  (1 pill) x 3 days 13 tablet 0   rosuvastatin (CRESTOR) 10 MG tablet Take 1 tablet (10 mg total) by mouth daily. 90 tablet 1   vitamin E 400 UNIT capsule Take 800 Units by mouth at bedtime.     No current facility-administered medications for this visit.    Allergies  Allergen Reactions   Benadryl [Diphenhydramine Hcl] Other (See Comments)    Keeps awake, jittery   Codeine Other (See Comments)    Keeps awake   Dilaudid [Hydromorphone Hcl] Itching and Nausea Only   Morphine And Related Itching and Nausea Only    Family History  Problem Relation Age of Onset   Diabetes Father    Breast cancer Neg Hx    Colon cancer Neg Hx    Esophageal cancer Neg Hx    Rectal cancer Neg Hx    Stomach cancer Neg Hx     Social History   Socioeconomic History   Marital status: Married    Spouse name: Not on file   Number of children: Not on file   Years of education: Not on file   Highest education level: Not on file  Occupational History   Not on file  Tobacco Use   Smoking status: Former    Packs/day: 0.50    Years: 4.00    Pack years: 2.00    Types: Cigarettes    Quit date: 01/02/1988    Years since quitting: 32.7    Smokeless tobacco: Never  Substance and Sexual Activity   Alcohol use: Yes    Alcohol/week: 0.0 standard drinks    Comment: Occasional Drink   Drug use: No   Sexual activity: Never  Other Topics Concern   Not on file  Social History Narrative  Not on file   Social Determinants of Health   Financial Resource Strain: Not on file  Food Insecurity: Not on file  Transportation Needs: Not on file  Physical Activity: Not on file  Stress: Not on file  Social Connections: Not on file  Intimate Partner Violence: Not on file     Constitutional: Patient reports fatigue and headache.  Denies fever or abrupt weight changes.  HEENT: Denies eye pain, eye redness, ear pain, ringing in the ears, wax buildup, runny nose, nasal congestion, bloody nose, or sore throat. Respiratory: Patient reports cough.  Denies difficulty breathing, shortness of breath, or sputum production.   Cardiovascular: Denies chest pain, chest tightness, palpitations or swelling in the hands or feet.  Gastrointestinal: Patient reports nausea and vomiting.  Denies abdominal pain, bloating, constipation, Neurological: Patient reports lightheadedness.  Denies dizziness, difficulty with memory, difficulty with speech or problems with balance and coordination.    No other specific complaints in a complete review of systems (except as listed in HPI above).    Observations/Objective:  Wt Readings from Last 3 Encounters:  08/28/20 215 lb (97.5 kg)  05/03/20 216 lb (98 kg)  02/28/20 217 lb (98.4 kg)    General: Appears her stated age, obese, ill-appearing but in NAD. HEENT: Head: normal shape and size; Nose: No congestion noted; Throat/Mouth: Hoarseness noted Pulmonary/Chest: Normal effort.  Dry cough noted.  No respiratory distress.  Neurological: Alert and oriented.   BMET    Component Value Date/Time   NA 139 07/05/2019 1327   K 3.7 07/05/2019 1327   CL 98 07/05/2019 1327   CO2 25 07/05/2019 1327   GLUCOSE 170 (H)  07/05/2019 1327   BUN 15 07/05/2019 1327   CREATININE 0.83 07/05/2019 1327   CALCIUM 9.6 07/05/2019 1327   GFRNONAA 81 07/05/2019 1327   GFRAA 94 07/05/2019 1327    Lipid Panel     Component Value Date/Time   CHOL 188 07/05/2019 1327   TRIG 148 07/05/2019 1327   HDL 46 07/05/2019 1327   CHOLHDL 4.1 07/05/2019 1327   LDLCALC 116 (H) 07/05/2019 1327    CBC    Component Value Date/Time   WBC 5.5 07/05/2019 1327   RBC 5.49 (H) 07/05/2019 1327   HGB 15.6 07/05/2019 1327   HCT 47.2 (H) 07/05/2019 1327   PLT 174 07/05/2019 1327   MCV 86 07/05/2019 1327   MCH 28.4 07/05/2019 1327   MCHC 33.1 07/05/2019 1327   RDW 12.7 07/05/2019 1327   LYMPHSABS 1.7 01/12/2017 1324   EOSABS 0.1 01/12/2017 1324   BASOSABS 0.0 01/12/2017 1324    Hgb A1C Lab Results  Component Value Date   HGBA1C 9.1 (H) 07/05/2019        Assessment and Plan:  COVID-19, DM2: Encounter for Form Completion:  Encourage rest and fluids Continue Dramamine as needed for nausea and vomiting Offered Rx cough syrup but she declines at this time we will continue OTC cough syrup FMLA forms completed, scanned into the chart and fax back to  [employer]   Follow Up Instructions:    I discussed the assessment and treatment plan with the patient. The patient was provided an opportunity to ask questions and all were answered. The patient agreed with the plan and demonstrated an understanding of the instructions.   The patient was advised to call back or seek an in-person evaluation if the symptoms worsen or if the condition fails to improve as anticipated.    Webb Silversmith, NP

## 2020-09-06 NOTE — Patient Instructions (Signed)
10 Things You Can Do to Manage Your COVID-19 Symptoms at Home If you have possible or confirmed COVID-19 Stay home except to get medical care. Monitor your symptoms carefully. If your symptoms get worse, call your healthcare provider immediately. Get rest and stay hydrated. If you have a medical appointment, call the healthcare provider ahead of time and tell them that you have or may have COVID-19. For medical emergencies, call 911 and notify the dispatch personnel that you have or may have COVID-19. Cover your cough and sneezes with a tissue or use the inside of your elbow. Wash your hands often with soap and water for at least 20 seconds or clean your hands with an alcohol-based hand sanitizer that contains at least 60% alcohol. As much as possible, stay in a specific room and away from other people in your home. Also, you should use a separate bathroom, if available. If you need to be around other people in or outside of the home, wear a mask. Avoid sharing personal items with other people in your household, like dishes, towels, and bedding. Clean all surfaces that are touched often, like counters, tabletops, and doorknobs. Use household cleaning sprays or wipes according to the label instructions. cdc.gov/coronavirus 09/16/2019 This information is not intended to replace advice given to you by your health care provider. Make sure you discuss any questions you have with your healthcare provider. Document Revised: 04/06/2020 Document Reviewed: 04/06/2020 Elsevier Patient Education  2022 [employer]  

## 2020-10-01 ENCOUNTER — Other Ambulatory Visit: Payer: Self-pay | Admitting: Family

## 2020-10-05 ENCOUNTER — Encounter: Payer: Self-pay | Admitting: Internal Medicine

## 2020-10-05 MED ORDER — DICLOFENAC SODIUM 75 MG PO TBEC: 75.0000 mg | DELAYED_RELEASE_TABLET | Freq: Two times a day (BID) | ORAL | 2 refills | Status: DC

## 2020-10-12 ENCOUNTER — Other Ambulatory Visit: Payer: Self-pay

## 2020-10-12 ENCOUNTER — Encounter: Payer: Self-pay | Admitting: Internal Medicine

## 2020-10-12 DIAGNOSIS — I10 Essential (primary) hypertension: Secondary | ICD-10-CM

## 2020-10-12 MED ORDER — HYDROCHLOROTHIAZIDE 25 MG PO TABS: 25.0000 mg | ORAL_TABLET | Freq: Every day | ORAL | 1 refills | Status: DC

## 2020-10-17 LAB — HEMOGLOBIN A1C: Hemoglobin A1C: 7.3

## 2020-10-22 DIAGNOSIS — E119 Type 2 diabetes mellitus without complications: Secondary | ICD-10-CM | POA: Diagnosis not present

## 2020-10-26 DIAGNOSIS — E785 Hyperlipidemia, unspecified: Secondary | ICD-10-CM | POA: Diagnosis not present

## 2020-10-26 DIAGNOSIS — Z794 Long term (current) use of insulin: Secondary | ICD-10-CM | POA: Diagnosis not present

## 2020-10-26 DIAGNOSIS — I10 Essential (primary) hypertension: Secondary | ICD-10-CM | POA: Diagnosis not present

## 2020-10-26 DIAGNOSIS — E1169 Type 2 diabetes mellitus with other specified complication: Secondary | ICD-10-CM | POA: Diagnosis not present

## 2020-11-01 ENCOUNTER — Encounter: Payer: Self-pay | Admitting: Internal Medicine

## 2020-11-01 ENCOUNTER — Other Ambulatory Visit: Payer: Self-pay | Admitting: Internal Medicine

## 2020-11-01 MED ORDER — CITALOPRAM HYDROBROMIDE 40 MG PO TABS: 40.0000 mg | ORAL_TABLET | Freq: Every day | ORAL | 0 refills | Status: DC

## 2020-11-01 NOTE — Telephone Encounter (Signed)
Requested medication (s) are due for refill today: yes  Requested medication (s) are on the active medication list: yes  Last refill:  05/03/20 #90 1 refill  Future visit scheduled: no  Notes to clinic:  only video visit noted at [facility] . No future visits. Do you want to refill Rx or send request to [facility]?     Requested Prescriptions  Pending Prescriptions Disp Refills   citalopram (CELEXA) 40 MG tablet [Pharmacy Med Name: Citalopram Hydrobromide 40 MG Oral Tablet] 90 tablet 0    Sig: Take 1 tablet by mouth once daily     There is no refill protocol information for this order

## 2020-11-01 NOTE — Addendum Note (Signed)
Addended by: Wilson Singer on: 11/01/2020 11:55 AM   Modules accepted: Orders

## 2020-11-16 ENCOUNTER — Encounter: Payer: Self-pay | Admitting: Internal Medicine

## 2020-11-16 ENCOUNTER — Encounter: Payer: Self-pay | Admitting: Emergency Medicine

## 2020-11-16 ENCOUNTER — Other Ambulatory Visit: Payer: Self-pay

## 2020-11-16 ENCOUNTER — Emergency Department
Admission: EM | Admit: 2020-11-16 | Discharge: 2020-11-16 | Disposition: A | Discharge status: Home / Self Care | Payer: BC Managed Care – PPO | Attending: Emergency Medicine | Admitting: Emergency Medicine

## 2020-11-16 ENCOUNTER — Ambulatory Visit: Payer: Self-pay | Admitting: *Deleted

## 2020-11-16 DIAGNOSIS — E876 Hypokalemia: Secondary | ICD-10-CM | POA: Insufficient documentation

## 2020-11-16 DIAGNOSIS — Z79899 Other long term (current) drug therapy: Secondary | ICD-10-CM | POA: Diagnosis not present

## 2020-11-16 DIAGNOSIS — J45909 Unspecified asthma, uncomplicated: Secondary | ICD-10-CM | POA: Insufficient documentation

## 2020-11-16 DIAGNOSIS — I10 Essential (primary) hypertension: Secondary | ICD-10-CM | POA: Diagnosis not present

## 2020-11-16 DIAGNOSIS — R030 Elevated blood-pressure reading, without diagnosis of hypertension: Secondary | ICD-10-CM | POA: Diagnosis not present

## 2020-11-16 DIAGNOSIS — E119 Type 2 diabetes mellitus without complications: Secondary | ICD-10-CM | POA: Insufficient documentation

## 2020-11-16 DIAGNOSIS — Z7984 Long term (current) use of oral hypoglycemic drugs: Secondary | ICD-10-CM | POA: Insufficient documentation

## 2020-11-16 DIAGNOSIS — Z794 Long term (current) use of insulin: Secondary | ICD-10-CM | POA: Insufficient documentation

## 2020-11-16 DIAGNOSIS — Z87891 Personal history of nicotine dependence: Secondary | ICD-10-CM | POA: Diagnosis not present

## 2020-11-16 LAB — BASIC METABOLIC PANEL
Anion gap: 9 (ref 5–15)
BUN: 22 mg/dL — ABNORMAL HIGH (ref 6–20)
CO2: 27 mmol/L (ref 22–32)
Calcium: 9.3 mg/dL (ref 8.9–10.3)
Chloride: 101 mmol/L (ref 98–111)
Creatinine, Ser: 0.82 mg/dL (ref 0.44–1.00)
GFR, Estimated: >60 mL/min (ref >60)
Glucose, Bld: 127 mg/dL — ABNORMAL HIGH (ref 70–99)
Potassium: 3.4 mmol/L — ABNORMAL LOW (ref 3.5–5.1)
Sodium: 137 mmol/L (ref 135–145)

## 2020-11-16 LAB — CBG MONITORING, ED: Glucose-Capillary: 120 mg/dL — ABNORMAL HIGH (ref 70–99)

## 2020-11-16 LAB — CBC
HCT: 40.9 % (ref 36.0–46.0)
Hemoglobin: 14.3 g/dL (ref 12.0–15.0)
MCH: 29.5 pg (ref 26.0–34.0)
MCHC: 35 g/dL (ref 30.0–36.0)
MCV: 84.5 fL (ref 80.0–100.0)
Platelets: 174 10*3/uL (ref 150–400)
RBC: 4.84 MIL/uL (ref 3.87–5.11)
RDW: 13.4 % (ref 11.5–15.5)
WBC: 4.8 10*3/uL (ref 4.0–10.5)
nRBC: 0 % (ref 0.0–0.2)

## 2020-11-16 MED ORDER — POTASSIUM CHLORIDE CRYS ER 20 MEQ PO TBCR
40.0000 meq | EXTENDED_RELEASE_TABLET | Freq: Once | ORAL | Status: AC
  Administered 2020-11-16: 40 meq via ORAL
  Filled 2020-11-16: qty 2

## 2020-11-16 NOTE — Telephone Encounter (Signed)
This encounter was created in error - please disregard.

## 2020-11-16 NOTE — Telephone Encounter (Signed)
Patient is calling to report she has elevated BP and low glucose level. Patient reports she has been feeling dizzy for couple days - she had work check her BP today- 210/100 and her glucose is 61. Patient reports she is eating peanut butter- which usually helps- but it has not changed glucose level. Advised ED for evaluation now- please have someone drive her there

## 2020-11-16 NOTE — ED Provider Notes (Signed)
[facility] Emergency Department Provider Note  ____________________________________________   Event Date/Time   First MD Initiated Contact with Patient 11/16/20 1323     (approximate)  I have reviewed the triage vital signs and the nursing notes.   HISTORY  Chief Complaint Hypertension   HPI Sheri Hood is a 55 y.o. female with a past medical history of anxiety, arthritis, asthma, depression, DM, GERD, HTN, HDL, OSA, and HDL who presents for assessment of high pressure and sugar of 58 she noted this morning at work.  She reports her pressure was 210/100.  She has she is currently only on hydrochlorothiazide which she took this morning.  She notes that over the last 3 to 4 days she has been feeling very tired and "out of it".  She states she was told that work-up may be related to some stress but is not sure.  She also states her sugars have been on the low side ranging from 1 60-100 and she has not yet had a chance to follow-up with her PCP since she noticed these low sugars which have been going on for the last 3 to 4 days as well.  She has no new headache, vision changes, chest pain, cough, shortness of breath, vomiting, abdominal pain, back pain, diarrhea, urinary symptoms, rash or acute extremity pain weakness numbness or tingling.  Denies any other acute concerns at this time.  States he does drink coffee but denies any illicit drug use or tobacco abuse.  She drinks alcohol occasionally.         Past Medical History:  Diagnosis Date   Allergy    Anemia    s/p ablation for heavy bleeding    Anxiety    Arthritis    arhtritis middle back, scolosis   Asthma    Depression    Diabetes mellitus without complication ([place])    GERD (gastroesophageal reflux disease)    Headache(784.0)    h/x migraines   Heart murmur    recent diagnosed- / ehho 12/30/10 report on chart   Hiatal hernia    Hyperlipidemia    Hypertension    Recurrent upper respiratory  infection (URI)    occ sore throat and slightly stuffy- no fever or cough   Sleep apnea    On CPAP   Sleep apnea     Patient Active Problem List   Diagnosis Date Noted   Pure hypercholesterolemia 07/23/2018   Arthritis 10/01/2017   Asthma 10/01/2017   OSA (obstructive sleep apnea) 10/01/2017   GERD (gastroesophageal reflux disease) 10/01/2017   New onset type 2 diabetes mellitus ([place]) 12/04/2015   Anxiety and depression 01/09/2014   HTN (hypertension) 09/09/2013    Past Surgical History:  Procedure Laterality Date   CHOLECYSTECTOMY     ENDOMETRIAL ABLATION  2008   FRACTURE SURGERY     HARDWARE REMOVAL  01/08/2011   Procedure: HARDWARE REMOVAL;  Surgeon: Jeneen Rinks P [place];  Location: [facility];  Service: Orthopedics;  Laterality: Left;  screw and plate left ankle and Fusion of Subtalor Joint Left Ankle    (Large c-arm)    TONSILLECTOMY      Prior to Admission medications   Medication Sig Start Date End Date Taking? Authorizing Provider  albuterol (VENTOLIN HFA) 108 (90 Base) MCG/ACT inhaler Inhale 1-2 puffs into the lungs every 6 (six) hours as needed for wheezing or shortness of breath. 03/15/20   Jearld Fenton, NP  citalopram (CELEXA) 40 MG tablet Take 1 tablet by  mouth once daily 11/01/20   Jearld Fenton, NP  citalopram (CELEXA) 40 MG tablet Take 1 tablet (40 mg total) by mouth daily. 11/01/20   Jearld Fenton, NP  Continuous Blood Gluc Receiver (FREESTYLE LIBRE 14 DAY READER) DEVI 1 Device by Does not apply route every morning. 08/27/18   Baity, Coralie Keens, NP  Continuous Blood Gluc Sensor (FREESTYLE LIBRE 14 DAY SENSOR) MISC 1 Device by Does not apply route every 14 (fourteen) days. 08/27/18   Jearld Fenton, NP  diclofenac (VOLTAREN) 75 MG EC tablet Take 1 tablet (75 mg total) by mouth 2 (two) times daily. 10/05/20   Jearld Fenton, NP  glipiZIDE (GLUCOTROL) 10 MG tablet Take 1 tablet (10 mg total) by mouth 2 (two) times daily before a meal. 08/03/19   Baity, Coralie Keens, NP  glucose  blood (BAYER CONTOUR NEXT TEST) test strip Use as instructed to test blood sugar once daily. E11.9 09/15/18   Jearld Fenton, NP  hydrochlorothiazide (HYDRODIURIL) 25 MG tablet Take 1 tablet (25 mg total) by mouth daily. 10/12/20   Jearld Fenton, NP  ibuprofen (ADVIL,MOTRIN) 200 MG tablet Take 200 mg by mouth every 6 (six) hours as needed. For pain    [provider]  Insulin Glargine (LANTUS SOLOSTAR) 100 UNIT/ML Solostar Pen Inject 34 Units into the skin at bedtime. 01/29/19   Jearld Fenton, NP  Insulin Pen Needle (B-D UF III MINI PEN NEEDLES) 31G X 5 MM MISC 1 each by Does not apply route daily. 01/29/20   Jearld Fenton, NP  Insulin Pen Needle (B-D UF III MINI PEN NEEDLES) 31G X 5 MM MISC 1 each by Other route in the morning and at bedtime. 06/08/20   Jearld Fenton, NP  Microlet Lancets MISC USE AS DIRECTED 1 ONCE DAILY TO TEST BLOOD SUGAR 10/17/19   Jearld Fenton, NP  omeprazole (PRILOSEC) 20 MG capsule Take 20 mg by mouth daily.    [provider]  rosuvastatin (CRESTOR) 10 MG tablet Take 1 tablet (10 mg total) by mouth daily. 02/28/20   Jearld Fenton, NP  Semaglutide,0.25 or 0.'5MG'$ /DOS, (OZEMPIC, 0.25 OR 0.5 MG/DOSE,) 2 MG/1.5ML SOPN INJECT 0.1875 MLS (0.'25MG'$  TOTAL) SUBCUTANEOUSLY ONCE WEEKLY 07/09/20   [provider]  vitamin E 180 MG (400 UNITS) capsule Take by mouth.    [provider]  vitamin E 400 UNIT capsule Take 800 Units by mouth at bedtime.    [provider]    Allergies Benadryl [diphenhydramine hcl], Codeine, Dilaudid [hydromorphone hcl], and Morphine and related  Family History  Problem Relation Age of Onset   Diabetes Father    Breast cancer Neg Hx    Colon cancer Neg Hx    Esophageal cancer Neg Hx    Rectal cancer Neg Hx    Stomach cancer Neg Hx     Social History Social History   Tobacco Use   Smoking status: Former    Packs/day: 0.50    Years: 4.00    Pack years: 2.00    Types: Cigarettes    Quit date:  01/02/1988    Years since quitting: 32.8   Smokeless tobacco: Never  Substance Use Topics   Alcohol use: Yes    Alcohol/week: 0.0 standard drinks    Comment: Occasional Drink   Drug use: No    Review of Systems  Review of Systems  Constitutional:  Positive for malaise/fatigue. Negative for chills and fever.  HENT:  Negative for sore  throat.   Eyes:  Negative for pain.  Respiratory:  Negative for cough and stridor.   Cardiovascular:  Negative for chest pain.  Gastrointestinal:  Negative for vomiting.  Genitourinary:  Negative for dysuria.  Musculoskeletal:  Negative for myalgias.  Skin:  Negative for rash.  Neurological:  Negative for seizures, loss of consciousness and headaches.  Psychiatric/Behavioral:  Negative for suicidal ideas.   All other systems reviewed and are negative.    ____________________________________________   PHYSICAL EXAM:  VITAL SIGNS: ED Triage Vitals  Enc Vitals Group     BP 11/16/20 1300 (!) 178/116     Pulse Rate 11/16/20 1300 79     Resp 11/16/20 1300 16     Temp 11/16/20 1300 97.9 F (36.6 C)     Temp Source 11/16/20 1300 Oral     SpO2 11/16/20 1300 98 %     Weight 11/16/20 1251 214 lb 15.2 oz (97.5 kg)     Height 11/16/20 1251 '5\' 3"'$  (1.6 m)     Head Circumference --      Peak Flow --      Pain Score 11/16/20 1251 0     Pain Loc --      Pain Edu? --      Excl. in [place]? --    Vitals:   11/16/20 1300  BP: (!) 178/116  Pulse: 79  Resp: 16  Temp: 97.9 F (36.6 C)  SpO2: 98%   Physical Exam Vitals and nursing note reviewed.  Constitutional:      General: She is not in acute distress.    Appearance: She is well-developed. She is obese.  HENT:     Head: Normocephalic and atraumatic.     Right Ear: External ear normal.     Left Ear: External ear normal.     Nose: Nose normal.     Mouth/Throat:     Mouth: Mucous membranes are moist.  Eyes:     Conjunctiva/sclera: Conjunctivae normal.  Cardiovascular:     Rate and Rhythm:  Normal rate and regular rhythm.     Heart sounds: No murmur heard. Pulmonary:     Effort: Pulmonary effort is normal. No respiratory distress.     Breath sounds: Normal breath sounds.  Abdominal:     Palpations: Abdomen is soft.     Tenderness: There is no abdominal tenderness.  Musculoskeletal:     Cervical back: Neck supple.  Skin:    General: Skin is warm and dry.     Capillary Refill: Capillary refill takes less than 2 seconds.  Neurological:     Mental Status: She is alert and oriented to person, place, and time.  Psychiatric:        Mood and Affect: Mood normal.    Cranial nerves II through XII grossly intact.  No pronator drift.  No finger dysmetria.  Symmetric 5/5 strength of all extremities.  Sensation intact to light touch in all extremities.  Unremarkable unassisted gait.  ____________________________________________   LABS (all labs ordered are listed, but only abnormal results are displayed)  Labs Reviewed  BASIC METABOLIC PANEL - Abnormal; Notable for the following components:      Result Value   Potassium 3.4 (*)    Glucose, Bld 127 (*)    BUN 22 (*)    All other components within normal limits  CBG MONITORING, ED - Abnormal; Notable for the following components:   Glucose-Capillary 120 (*)    All other components within normal limits  CBC  URINALYSIS, COMPLETE (UACMP) WITH MICROSCOPIC  POC URINE PREG, ED  CBG MONITORING, ED   ____________________________________________  EKG  Sinus rhythm with a ventricular rate of 73, normal axis, unremarkable intervals without clear evidence of acute ischemia or significant arrhythmia. ____________________________________________  RADIOLOGY  ED MD interpretation:   Official radiology report(s): No results found.  ____________________________________________   PROCEDURES  Procedure(s) performed (including Critical Care):  Procedures   ____________________________________________   INITIAL IMPRESSION  / ASSESSMENT AND PLAN / ED COURSE        Patient presents with above-stated history and exam for assessment of feeling out of it and tired a little bit over the last couple days as well as her sugar of 58 earlier today on her continuous glucose monitor and elevated pressure at 210/100 overwork.  On arrival her blood pressure is 178/116 otherwise stable vital signs on room air.  She has a nonfocal neuro exam and is denying any other associated sick symptoms at this time.  I suspect high blood pressures may be both contributing to patient feeling poorly with some nonspecific "feeling out of it" symptoms last couple days however I think this is a separate issue than reported low blood sugars noted on continuous monitor.  With regard to high blood pressure it seems it has come down on its own a little bit over the last couple of hours.  She has no evidence of neurological deficit and denies any headache or chest pain or other associated symptoms.  EKG shows no evidence of arrhythmia or ischemia.  I have very low suspicion at this time for [facility], dissection, ACS or other immediate life-threatening complication related to this systolic over [id number] earlier today.  BMP shows kidney function at baseline.  Sugar is 127.  No other evidence on history or exam of acute endorgan damage at this time.  CBC shows no acute anemia explain patient's feeling tired and out of it.  With regard to her pressure I think she is stable for close PCP follow-up for further titration of her blood pressure medications.  With regard to her sugars ranging between 60s and low 100s over last couple days on her continuous glucose monitor I suspect her continuous glucose monitor may be giving her erroneous readings.  In the emergency room it is reading at 55 on several checks over about 2 hours.  However on BMP it is in the 120s and on fingerstick glucose about an hour later it was 120.  At the same time it is reading 55 on the continuous monitor.   Advised patient to revert back to using her lancets and checking her sugars at least 3 times a day using a fingerstick until she can follow-up with her endocrinologist to have her continuous glucose monitor checked to see if needs to be calibrated or adjusted.  She states she has adequate supplies for this and is amenable this plan.   Overall given absence of clear acute focal symptoms, focal neurological deficit with reassuring EKG and labs as well as glucose I think patient is stable at this time for close outpatient PCP and endocrine follow-up.  She was discharged stable condition and strict return precautions were discussed including development of any sudden headache, chest pain, shortness of breath, dizziness, vision changes or persistently low sugars around 60 that she is able to bring up with food.  ____________________________________________   FINAL CLINICAL IMPRESSION(S) / ED DIAGNOSES  Final diagnoses:  Hypertension, unspecified type  Hypokalemia    Medications  potassium chloride  SA (KLOR-CON) CR tablet 40 mEq (40 mEq Oral Given 11/16/20 1355)     ED Discharge Orders     None        Note:  This document was prepared using Dragon voice recognition software and may include unintentional dictation errors.    Lucrezia Starch, MD 11/16/20 [phone]

## 2020-11-16 NOTE — ED Triage Notes (Signed)
C/O high blood pressure today, 210/100 at work and blood glucose 58.  Patient states she takes HCTZ daily, last taken this morning at 0745.  States has been feeling "off" for a couple of days.  Blood pressure noted to be elevated a few weeks ago while seeing Endocrinology.

## 2020-11-16 NOTE — Telephone Encounter (Signed)
Reason for Disposition  [1] Low blood sugar symptoms persist > 30 minutes AND [2] using low blood sugar Care Advice  Answer Assessment - Initial Assessment Questions 1. SYMPTOMS: "What symptoms are you concerned about?"     Low glucose 2. ONSET:  "When did the symptoms start?"     2 days ago 3. BLOOD GLUCOSE: "What is your blood glucose level?"      61 4. USUAL RANGE: "What is your blood glucose level usually?" (e.g., usual fasting morning value, usual evening value)    Up and down- 200"s- past 2 days trouble with low reading 5. TYPE 1 or 2:  "Do you know what type of diabetes you have?"  (e.g., Type 1, Type 2, Gestational; doesn't know)      Type 2 6. INSULIN: "Do you take insulin?" "What type of insulin(s) do you use? What is the mode of delivery? (syringe, pen; injection or pump) "When did you last give yourself an insulin dose?" (i.e., time or hours/minutes ago) "How much did you give?" (i.e., how many units)     Insulin, ozempic and glipizide- take insulin at night- glipizide this morning 7. DIABETES PILLS: "Do you take any pills for your diabetes?"     glipizide 8. OTHER SYMPTOMS: "Do you have any symptoms?" (e.g., fever, frequent urination, difficulty breathing, vomiting)     Dizziness 9. LOW BLOOD GLUCOSE TREATMENT: "What have you done so far to treat the low blood glucose level?"     Patient states she has eaten peanut butter- that usually brings it up- but not helping 10. FOOD: "When did you last eat or drink?"       now 11. ALONE: "Are you alone right now or is someone with you?"        No-patient is at work 12. PREGNANCY: "Is there any chance you are pregnant?" "When was your last menstrual period?"       N/a  Protocols used: Diabetes - Low Blood Sugar-A-AH

## 2020-11-16 NOTE — ED Notes (Signed)
Pt NAD, a/ox4. Pt verbalizes understanding of all DC and f/u instructions. All questions answered. Pt walks with steady gait to lobby at DC with spouse.

## 2020-11-21 NOTE — Telephone Encounter (Signed)
Copied from [place] [id number]. Topic: Appointment Scheduling - Scheduling Inquiry for Clinic >> Nov 16, 2020  2:42 PM Greggory Keen D wrote: Reason for CRM: pt went to the ED today for elevated bp.  They told her to call her primary and make an appt.  I scheduled and appt for this coming Thursday but the patient wants to talk to a nurse about changing her prescription mg's.  CB#  [phone]

## 2020-11-22 ENCOUNTER — Other Ambulatory Visit: Payer: Self-pay

## 2020-11-22 ENCOUNTER — Ambulatory Visit: Payer: BC Managed Care – PPO | Admitting: Internal Medicine

## 2020-11-22 ENCOUNTER — Encounter: Payer: Self-pay | Admitting: Internal Medicine

## 2020-11-22 VITALS — BP 120/72 | HR 73 | Temp 98.0°F | Resp 17 | Ht 63.0 in | Wt 212.4 lb

## 2020-11-22 DIAGNOSIS — Z6832 Body mass index (BMI) 32.0-32.9, adult: Secondary | ICD-10-CM | POA: Insufficient documentation

## 2020-11-22 DIAGNOSIS — E876 Hypokalemia: Secondary | ICD-10-CM

## 2020-11-22 DIAGNOSIS — E1165 Type 2 diabetes mellitus with hyperglycemia: Secondary | ICD-10-CM

## 2020-11-22 DIAGNOSIS — I10 Essential (primary) hypertension: Secondary | ICD-10-CM

## 2020-11-22 DIAGNOSIS — E6609 Other obesity due to excess calories: Secondary | ICD-10-CM | POA: Insufficient documentation

## 2020-11-22 DIAGNOSIS — Z6837 Body mass index (BMI) 37.0-37.9, adult: Secondary | ICD-10-CM

## 2020-11-22 DIAGNOSIS — Z6826 Body mass index (BMI) 26.0-26.9, adult: Secondary | ICD-10-CM | POA: Insufficient documentation

## 2020-11-22 DIAGNOSIS — M79622 Pain in left upper arm: Secondary | ICD-10-CM

## 2020-11-22 DIAGNOSIS — F439 Reaction to severe stress, unspecified: Secondary | ICD-10-CM

## 2020-11-22 MED ORDER — LISINOPRIL 5 MG PO TABS: 5.0000 mg | ORAL_TABLET | Freq: Every day | ORAL | 0 refills | Status: DC

## 2020-11-22 NOTE — Assessment & Plan Note (Signed)
Encourage diet and exercise for weight loss 

## 2020-11-22 NOTE — Patient Instructions (Signed)
Hypokalemia Hypokalemia means that the amount of potassium in the blood is lower than normal. Potassium is a chemical (electrolyte) that helps regulate the amount of fluid in the body. It also stimulates muscle tightening (contraction) and helps nerves work properly. Normally, most of the body's potassium is inside cells, and only a very small amount is in the blood. Because the amount in the blood is so small, minor changes to potassium levels in the blood can be life-threatening. What are the causes? This condition may be caused by: Antibiotic medicine. Diarrhea or vomiting. Taking too much of a medicine that helps you have a bowel movement (laxative) can cause diarrhea and lead to hypokalemia. Chronic kidney disease (CKD). Medicines that help the body get rid of excess fluid (diuretics). Eating disorders, such as bulimia. Low magnesium levels in the body. Sweating a lot. What are the signs or symptoms? Symptoms of this condition include: Weakness. Constipation. Fatigue. Muscle cramps. Mental confusion. Skipped heartbeats or irregular heartbeat (palpitations). Tingling or numbness. How is this diagnosed? This condition is diagnosed with a blood test. How is this treated? This condition may be treated by: Taking potassium supplements by mouth. Adjusting the medicines that you take. Eating more foods that contain a lot of potassium. If your potassium level is very low, you may need to get potassium through an IV and be monitored in the hospital. Follow these instructions at home:  Take over-the-counter and prescription medicines only as told by your health care provider. This includes vitamins and supplements. Eat a healthy diet. A healthy diet includes fresh fruits and vegetables, whole grains, healthy fats, and lean proteins. If instructed, eat more foods that contain a lot of potassium. This includes: Nuts, such as peanuts and pistachios. Seeds, such as sunflower seeds and  pumpkin seeds. Peas, lentils, and lima beans. Whole grain and bran cereals and breads. Fresh fruits and vegetables, such as apricots, avocado, bananas, cantaloupe, kiwi, oranges, tomatoes, asparagus, and potatoes. Orange juice. Tomato juice. Red meats. Yogurt. Keep all follow-up visits as told by your health care provider. This is important. Contact a health care provider if you: Have weakness that gets worse. Feel your heart pounding or racing. Vomit. Have diarrhea. Have diabetes (diabetes mellitus) and you have trouble keeping your blood sugar (glucose) in your target range. Get help right away if you: Have chest pain. Have shortness of breath. Have vomiting or diarrhea that lasts for more than 2 days. Faint. Summary Hypokalemia means that the amount of potassium in the blood is lower than normal. This condition is diagnosed with a blood test. Hypokalemia may be treated by taking potassium supplements, adjusting the medicines that you take, or eating more foods that are high in potassium. If your potassium level is very low, you may need to get potassium through an IV and be monitored in the hospital. This information is not intended to replace advice given to you by your health care provider. Make sure you discuss any questions you have with your health care provider. Document Revised: 09/29/2017 Document Reviewed: 09/30/2017 Elsevier Patient Education  2022 [employer]  

## 2020-11-22 NOTE — Progress Notes (Signed)
Subjective:    Patient ID: Sheri Hood, female    DOB: 16-Feb-1966, 55 y.o.   MRN: [id number]  HPI  Pt presents to the clinic today for ER follow up. She went to the ER 11/16/20 with c/o elevated blood pressure of 200/100 and concern for low blood sugars over the last 3-4 days. She had been taking her HCTZ as prescribed. Labs revealed marginally low potassium which they replaced with KCL 40 meq. ECG was normal. Her glucose was 127. Her last A1C was 7.3%, 10/2020. She is taking Ozempic and Glipizide as prescribed. Her blood pressure came down to 178/116 without intervention. She was discharged and advised to follow up with her PCP. Since discharge, she reports her blood pressure has ranged 126/75-164/99. She intermittently gets a swimmy headed feeling in her head but denies dizziness, visual changes, chest pain or shortness of breath.  She has not had any issues with low blood sugars since that day.  She does follow with endocrinology.  She has been under a lot of stress lately.  Review of Systems  Past Medical History:  Diagnosis Date   Allergy    Anemia    s/p ablation for heavy bleeding    Anxiety    Arthritis    arhtritis middle back, scolosis   Asthma    Depression    Diabetes mellitus without complication ([place])    GERD (gastroesophageal reflux disease)    Headache(784.0)    h/x migraines   Heart murmur    recent diagnosed- / ehho 12/30/10 report on chart   Hiatal hernia    Hyperlipidemia    Hypertension    Recurrent upper respiratory infection (URI)    occ sore throat and slightly stuffy- no fever or cough   Sleep apnea    On CPAP   Sleep apnea     Current Outpatient Medications  Medication Sig Dispense Refill   albuterol (VENTOLIN HFA) 108 (90 Base) MCG/ACT inhaler Inhale 1-2 puffs into the lungs every 6 (six) hours as needed for wheezing or shortness of breath. 18 g 0   citalopram (CELEXA) 40 MG tablet Take 1 tablet by mouth once daily 90 tablet 0   citalopram  (CELEXA) 40 MG tablet Take 1 tablet (40 mg total) by mouth daily. 90 tablet 0   Continuous Blood Gluc Receiver (FREESTYLE LIBRE 14 DAY READER) DEVI 1 Device by Does not apply route every morning. 1 each 2   Continuous Blood Gluc Sensor (FREESTYLE LIBRE 14 DAY SENSOR) MISC 1 Device by Does not apply route every 14 (fourteen) days. 6 each 3   diclofenac (VOLTAREN) 75 MG EC tablet Take 1 tablet (75 mg total) by mouth 2 (two) times daily. 60 tablet 2   glipiZIDE (GLUCOTROL) 10 MG tablet Take 1 tablet (10 mg total) by mouth 2 (two) times daily before a meal. 60 tablet 0   glucose blood (BAYER CONTOUR NEXT TEST) test strip Use as instructed to test blood sugar once daily. E11.9 100 each 12   hydrochlorothiazide (HYDRODIURIL) 25 MG tablet Take 1 tablet (25 mg total) by mouth daily. 90 tablet 1   ibuprofen (ADVIL,MOTRIN) 200 MG tablet Take 200 mg by mouth every 6 (six) hours as needed. For pain     Insulin Glargine (LANTUS SOLOSTAR) 100 UNIT/ML Solostar Pen Inject 34 Units into the skin at bedtime. 10 pen 1   Insulin Pen Needle (B-D UF III MINI PEN NEEDLES) 31G X 5 MM MISC 1 each by Does not apply route  daily. 30 each 0   Insulin Pen Needle (B-D UF III MINI PEN NEEDLES) 31G X 5 MM MISC 1 each by Other route in the morning and at bedtime. 100 each 2   Microlet Lancets MISC USE AS DIRECTED 1 ONCE DAILY TO TEST BLOOD SUGAR 100 each 0   omeprazole (PRILOSEC) 20 MG capsule Take 20 mg by mouth daily.     rosuvastatin (CRESTOR) 10 MG tablet Take 1 tablet (10 mg total) by mouth daily. 90 tablet 1   Semaglutide,0.25 or 0.5MG /DOS, (OZEMPIC, 0.25 OR 0.5 MG/DOSE,) 2 MG/1.5ML SOPN INJECT 0.1875 MLS (0.25MG  TOTAL) SUBCUTANEOUSLY ONCE WEEKLY     vitamin E 180 MG (400 UNITS) capsule Take by mouth.     vitamin E 400 UNIT capsule Take 800 Units by mouth at bedtime.     No current facility-administered medications for this visit.    Allergies  Allergen Reactions   Benadryl [Diphenhydramine Hcl] Other (See Comments)     Keeps awake, jittery   Codeine Other (See Comments)    Keeps awake   Dilaudid [Hydromorphone Hcl] Itching and Nausea Only   Morphine And Related Itching and Nausea Only    Family History  Problem Relation Age of Onset   Diabetes Father    Breast cancer Neg Hx    Colon cancer Neg Hx    Esophageal cancer Neg Hx    Rectal cancer Neg Hx    Stomach cancer Neg Hx     Social History   Socioeconomic History   Marital status: Married    Spouse name: Not on file   Number of children: Not on file   Years of education: Not on file   Highest education level: Not on file  Occupational History   Not on file  Tobacco Use   Smoking status: Former    Packs/day: 0.50    Years: 4.00    Pack years: 2.00    Types: Cigarettes    Quit date: 01/02/1988    Years since quitting: 32.9   Smokeless tobacco: Never  Substance and Sexual Activity   Alcohol use: Yes    Alcohol/week: 0.0 standard drinks    Comment: Occasional Drink   Drug use: No   Sexual activity: Never  Other Topics Concern   Not on file  Social History Narrative   Not on file   Social Determinants of Health   Financial Resource Strain: Not on file  Food Insecurity: Not on file  Transportation Needs: Not on file  Physical Activity: Not on file  Stress: Not on file  Social Connections: Not on file  Intimate Partner Violence: Not on file     Constitutional: Denies fever, malaise, fatigue, headache or abrupt weight changes.  HEENT: Denies eye pain, eye redness, ear pain, ringing in the ears, wax buildup, runny nose, nasal congestion, bloody nose, or sore throat. Respiratory: Denies difficulty breathing, shortness of breath, cough or sputum production.   Cardiovascular: Denies chest pain, chest tightness, palpitations or swelling in the hands or feet.  Musculoskeletal: Patient reports intermittent left upper arm pain.  Denies decrease in range of motion, difficulty with gait,  or joint pain and swelling.  Skin: Denies  redness, rashes, lesions or ulcercations.  Neurological: Patient reports intermittent lightheadedness.  Denies numbness, tingling, weakness of her left upper extremity or problems with balance and coordination.  Psych: Patient reports stress, has history of anxiety and depression.  Denies SI/HI.  No other specific complaints in a complete review of systems (except as  listed in HPI above).     Objective:   Physical Exam  BP 120/72 (BP Location: Left Arm, Patient Position: Sitting, Cuff Size: Normal)   Pulse 73   Temp 98 F (36.7 C) (Temporal)   Resp 17   Ht 5\' 3"  (1.6 m)   Wt 212 lb 6.4 oz (96.3 kg)   SpO2 98%   BMI 37.62 kg/m    Wt Readings from Last 3 Encounters:  11/16/20 214 lb 15.2 oz (97.5 kg)  08/28/20 215 lb (97.5 kg)  05/03/20 216 lb (98 kg)    General: Appears her stated age, obese, in NAD. Skin: Warm, dry and intact. No ulcerations noted. HEENT: Head: normal shape and size; Eyes: sclera white and EOMs intact;  Cardiovascular: Normal rate and rhythm. S1,S2 noted.  No murmur, rubs or gallops noted. No JVD or BLE edema. No carotid bruits noted. Pulmonary/Chest: Normal effort and positive vesicular breath sounds. No respiratory distress. No wheezes, rales or ronchi noted.  Musculoskeletal: No pain with palpation of the left upper arm.  No difficulty with gait.  Neurological: Alert and oriented.  Psychiatric: Mood and affect normal. Behavior is normal. Judgment and thought content normal.    BMET    Component Value Date/Time   NA 137 11/16/2020 1259   NA 139 07/05/2019 1327   K 3.4 (L) 11/16/2020 1259   CL 101 11/16/2020 1259   CO2 27 11/16/2020 1259   GLUCOSE 127 (H) 11/16/2020 1259   BUN 22 (H) 11/16/2020 1259   BUN 15 07/05/2019 1327   CREATININE 0.82 11/16/2020 1259   CALCIUM 9.3 11/16/2020 1259   GFRNONAA >60 11/16/2020 1259   GFRAA 94 07/05/2019 1327    Lipid Panel     Component Value Date/Time   CHOL 188 07/05/2019 1327   TRIG 148 07/05/2019 1327    HDL 46 07/05/2019 1327   CHOLHDL 4.1 07/05/2019 1327   LDLCALC 116 (H) 07/05/2019 1327    CBC    Component Value Date/Time   WBC 4.8 11/16/2020 1259   RBC 4.84 11/16/2020 1259   HGB 14.3 11/16/2020 1259   HGB 15.6 07/05/2019 1327   HCT 40.9 11/16/2020 1259   HCT 47.2 (H) 07/05/2019 1327   PLT 174 11/16/2020 1259   PLT 174 07/05/2019 1327   MCV 84.5 11/16/2020 1259   MCV 86 07/05/2019 1327   MCH 29.5 11/16/2020 1259   MCHC 35.0 11/16/2020 1259   RDW 13.4 11/16/2020 1259   RDW 12.7 07/05/2019 1327   LYMPHSABS 1.7 01/12/2017 1324   EOSABS 0.1 01/12/2017 1324   BASOSABS 0.0 01/12/2017 1324    Hgb A1C Lab Results  Component Value Date   HGBA1C 9.1 (H) 07/05/2019           Assessment & Plan:   ER Follow Up for HTN, DM 2, Hypokalemia:  ER notes, labs and imaging reviewed Will repeat potassium level today Add Lisinopril 5 mg daily for HTN/renal protection in addition to HCTZ Encouraged her to consume a low salt, low carb diet and exercise for weight loss Continue Ozempic and Glipizide, she will continue to follow with endocrinology Update me in 2 weeks with BP readings  Left Upper Arm Pain:  I doubt this is related to hypertension or ACS Could be stress related It does not seem like bursitis or tendinitis Discussed work-up with x-ray of left shoulder and cervical spine for further evaluation if symptoms persist or worsen  Return precautions discussed Webb Silversmith, NP This visit occurred during the  SARS-CoV-2 public health emergency.  Safety protocols were in place, including screening questions prior to the visit, additional usage of staff PPE, and extensive cleaning of exam room while observing appropriate contact time as indicated for disinfecting solutions.

## 2020-12-18 ENCOUNTER — Other Ambulatory Visit: Payer: Self-pay | Admitting: Internal Medicine

## 2020-12-18 DIAGNOSIS — Z1231 Encounter for screening mammogram for malignant neoplasm of breast: Secondary | ICD-10-CM

## 2020-12-20 ENCOUNTER — Encounter: Payer: BC Managed Care – PPO | Admitting: Dermatology

## 2020-12-20 ENCOUNTER — Encounter: Payer: Self-pay | Admitting: Internal Medicine

## 2020-12-20 MED ORDER — LISINOPRIL 10 MG PO TABS: 10.0000 mg | ORAL_TABLET | Freq: Every day | ORAL | 0 refills | Status: DC

## 2021-01-02 ENCOUNTER — Other Ambulatory Visit: Payer: Self-pay | Admitting: Internal Medicine

## 2021-01-02 ENCOUNTER — Encounter: Payer: Self-pay | Admitting: Internal Medicine

## 2021-01-03 MED ORDER — OZEMPIC (0.25 OR 0.5 MG/DOSE) 2 MG/1.5ML ~~LOC~~ SOPN: 2.0000 mg | PEN_INJECTOR | SUBCUTANEOUS | 1 refills | Status: DC

## 2021-01-07 MED ORDER — LISINOPRIL 10 MG PO TABS: 10.0000 mg | ORAL_TABLET | Freq: Every day | ORAL | 4 refills | Status: DC

## 2021-01-08 ENCOUNTER — Ambulatory Visit
Admission: RE | Admit: 2021-01-08 | Discharge: 2021-01-08 | Disposition: A | Discharge status: Home / Self Care | Payer: BC Managed Care – PPO | Source: Ambulatory Visit | Attending: Internal Medicine | Admitting: Internal Medicine

## 2021-01-08 ENCOUNTER — Other Ambulatory Visit: Payer: Self-pay

## 2021-01-08 DIAGNOSIS — Z1231 Encounter for screening mammogram for malignant neoplasm of breast: Secondary | ICD-10-CM | POA: Insufficient documentation

## 2021-01-09 ENCOUNTER — Encounter: Payer: Self-pay | Admitting: Internal Medicine

## 2021-01-14 ENCOUNTER — Encounter: Payer: Self-pay | Admitting: Internal Medicine

## 2021-01-16 ENCOUNTER — Other Ambulatory Visit: Payer: Self-pay | Admitting: Internal Medicine

## 2021-01-16 ENCOUNTER — Encounter: Payer: Self-pay | Admitting: Internal Medicine

## 2021-01-16 NOTE — Telephone Encounter (Signed)
Requested Prescriptions  Pending Prescriptions Disp Refills  . diclofenac (VOLTAREN) 75 MG EC tablet [Pharmacy Med Name: Diclofenac Sodium 75 MG Oral Tablet Delayed Release] 60 tablet 0    Sig: Take 1 tablet by mouth twice daily     Analgesics:  NSAIDS Passed - 01/16/2021  7:13 PM      Passed - Cr in normal range and within 360 days    Creatinine, Ser  Date Value Ref Range Status  11/16/2020 0.82 0.44 - 1.00 mg/dL Final         Passed - HGB in normal range and within 360 days    Hemoglobin  Date Value Ref Range Status  11/16/2020 14.3 12.0 - 15.0 g/dL Final  07/05/2019 15.6 11.1 - 15.9 g/dL Final         Passed - Patient is not pregnant      Passed - Valid encounter within last 12 months    Recent Outpatient Visits          1 month ago Hypokalemia   [place], NP   4 months ago [facility] [place], Coralie Keens, NP   4 months ago Acute viral syndrome   [place], Devonne Doughty, DO

## 2021-02-01 ENCOUNTER — Encounter: Payer: Self-pay | Admitting: Internal Medicine

## 2021-02-06 ENCOUNTER — Other Ambulatory Visit: Payer: Self-pay

## 2021-02-06 ENCOUNTER — Encounter: Payer: Self-pay | Admitting: Medical

## 2021-02-06 ENCOUNTER — Ambulatory Visit: Payer: BC Managed Care – PPO | Admitting: Medical

## 2021-02-06 VITALS — BP 110/80 | HR 104 | Temp 100.0°F | Resp 18

## 2021-02-06 DIAGNOSIS — R6889 Other general symptoms and signs: Secondary | ICD-10-CM

## 2021-02-06 DIAGNOSIS — R062 Wheezing: Secondary | ICD-10-CM

## 2021-02-06 DIAGNOSIS — Z20822 Contact with and (suspected) exposure to covid-19: Secondary | ICD-10-CM

## 2021-02-06 DIAGNOSIS — J40 Bronchitis, not specified as acute or chronic: Secondary | ICD-10-CM

## 2021-02-06 LAB — POCT INFLUENZA A/B
Influenza A, POC: NEGATIVE
Influenza B, POC: NEGATIVE

## 2021-02-06 LAB — POC COVID19 BINAXNOW: SARS Coronavirus 2 Ag: NEGATIVE

## 2021-02-06 MED ORDER — PREDNISONE 10 MG (21) PO TBPK: ORAL_TABLET | ORAL | 0 refills | Status: DC

## 2021-02-06 MED ORDER — DOXYCYCLINE HYCLATE 100 MG PO TABS: 100.0000 mg | ORAL_TABLET | Freq: Two times a day (BID) | ORAL | 0 refills | Status: DC

## 2021-02-06 NOTE — Progress Notes (Addendum)
Work note completed  Subjective:    Patient ID: Sheri Hood, female    DOB: 1965/09/20, 55 y.o.   MRN: [id number]  HPI 74 lyo female in non acute distress, slept with sick grandchild on Friday and Saturday night. Patient on  Sunday started with  diarrhea and not feeling well. Cough developed on Monday. Now with sore throat.   Taking [employer] cold and flu medication Taking Delsym for cough.  Takes Ventolin for wheezing, usually uses medication while sick. Does only one  puff due to medication making her jittery. Last used yesterday.  Blood pressure 110/80, pulse (!) 104, temperature 100 F (37.8 C), temperature source Tympanic, resp. rate 18, SpO2 97 %.   Unvaccinated for Covid-19 and Influenza  Allergies  Allergen Reactions   Benadryl [Diphenhydramine Hcl] Other (See Comments)    Keeps awake, jittery   Codeine Other (See Comments)    Keeps awake   Dilaudid [Hydromorphone Hcl] Itching and Nausea Only   Morphine And Related Itching and Nausea Only      Non smoker  Review of Systems  Constitutional:  Positive for chills and fever.  Respiratory:  Positive for cough.   Gastrointestinal:  Positive for diarrhea, nausea and vomiting.  Musculoskeletal:  Positive for myalgias.  Neurological:  Positive for headaches.      Objective:   Physical Exam Constitutional:      Appearance: Normal appearance. She is obese.  HENT:     Head: Normocephalic and atraumatic.     Right Ear: Ear canal and external ear normal.     Left Ear: Ear canal and external ear normal.     Mouth/Throat:     Mouth: Mucous membranes are moist.     Pharynx: Posterior oropharyngeal erythema present. No oropharyngeal exudate.  Eyes:     Extraocular Movements: Extraocular movements intact.     Conjunctiva/sclera: Conjunctivae normal.     Pupils: Pupils are equal, round, and reactive to light.  Cardiovascular:     Rate and Rhythm: Normal rate and regular rhythm.     Heart sounds: Normal heart sounds. No  murmur heard.   No friction rub. No gallop.  Pulmonary:     Effort: Pulmonary effort is normal. No respiratory distress.     Breath sounds: No stridor. Wheezing present. No rhonchi or rales.  Musculoskeletal:        General: Normal range of motion.     Cervical back: Normal range of motion and neck supple.  Lymphadenopathy:     Cervical: Cervical adenopathy present.  Skin:    General: Skin is warm and dry.  Neurological:     General: No focal deficit present.     Mental Status: She is alert and oriented to person, place, and time. Mental status is at baseline.  Psychiatric:        Mood and Affect: Mood normal.        Behavior: Behavior normal.        Thought Content: Thought content normal.        Judgment: Judgment normal.   Couging in room. Results for orders placed or performed in visit on 02/06/21 (from the past 24 hour(s))  POC COVID-19     Status: Normal   Collection Time: 02/06/21 11:36 AM  Result Value Ref Range   SARS Coronavirus 2 Ag Negative Negative  POCT Influenza A/B     Status: Normal   Collection Time: 02/06/21 11:36 AM  Result Value Ref Range   Influenza A, POC Negative  Negative   Influenza B, POC Negative Negative       Assessment & Plan:  Bronchitis Cough Hx DM and HTN Pending Covid-19 PCR Continue allergy piil and start Flonase. Stop the  [facility] cold and flu due to phenylephrine and Dx of HTN. Take plain Mucinex  per package instructions. Use your inhaler ever 6 hours, one puff as prescribed. Return to the clinic in 3-5 days if not improving. Patient verbalizes understanding and has no questions at discharge.

## 2021-02-06 NOTE — Patient Instructions (Signed)
Acute Bronchitis, Adult °Acute bronchitis is sudden inflammation of the main airways (bronchi) that come off the windpipe (trachea) in the lungs. The swelling causes the airways to get smaller and make more mucus than normal. This can make it hard to breathe and can cause coughing or noisy breathing (wheezing). °Acute bronchitis may last several weeks. The cough may last longer. Allergies, asthma, and exposure to smoke may make the condition worse. °What are the causes? °This condition can be caused by germs and by substances that irritate the lungs, including: °Cold and flu viruses. The most common cause of this condition is the virus that causes the common cold. °Bacteria. This is less common. °Breathing in substances that irritate the lungs, including: °Smoke from cigarettes and other forms of tobacco. °Dust and pollen. °Fumes from household cleaning products, gases, or burned fuel. °Indoor or outdoor air pollution. °What increases the risk? °The following factors may make you more likely to develop this condition: °A weak body's defense system, also called the immune system. °A condition that affects your lungs and breathing, such as asthma. °What are the signs or symptoms? °Common symptoms of this condition include: °Coughing. This may bring up clear, yellow, or green mucus from your lungs (sputum). °Wheezing. °Runny or stuffy nose. °Having too much mucus in your lungs (chest congestion). °Shortness of breath. °Aches and pains, including sore throat or chest. °How is this diagnosed? °This condition is usually diagnosed based on: °Your symptoms and medical history. °A physical exam. °You may also have other tests, including tests to rule out other conditions, such as pneumonia. These tests include: °A test of lung function. °Test of a mucus sample to look for the presence of bacteria. °Tests to check the oxygen level in your blood. °Blood tests. °Chest X-ray. °How is this treated? °Most cases of acute bronchitis  clear up over time without treatment. Your health care provider may recommend: °Drinking more fluids to help thin your mucus so it is easier to cough up. °Taking inhaled medicine (inhaler) to improve air flow in and out of your lungs. °Using a vaporizer or a humidifier. These are machines that add water to the air to help you breathe better. °Taking a medicine that thins mucus and clears congestion (expectorant). °Taking a medicine that prevents or stops coughing (cough suppressant). °It is notcommon to take an antibiotic medicine for this condition. °Follow these instructions at home: ° °Take over-the-counter and prescription medicines only as told by your health care provider. °Use an inhaler, vaporizer, or humidifier as told by your health care provider. °Take two teaspoons (10 mL) of honey at bedtime to lessen coughing at night. °Drink enough fluid to keep your urine pale yellow. °Do not use any products that contain nicotine or tobacco. These products include cigarettes, chewing tobacco, and vaping devices, such as e-cigarettes. If you need help quitting, ask your health care provider. °Get plenty of rest. °Return to your normal activities as told by your health care provider. Ask your health care provider what activities are safe for you. °Keep all follow-up visits. This is important. °How is this prevented? °To lower your risk of getting this condition again: °Wash your hands often with soap and water for at least 20 seconds. If soap and water are not available, use hand sanitizer. °Avoid contact with people who have cold symptoms. °Try not to touch your mouth, nose, or eyes with your hands. °Avoid breathing in smoke or chemical fumes. Breathing smoke or chemical fumes will make your condition   worse. °Get the flu shot every year. °Contact a health care provider if: °Your symptoms do not improve after 2 weeks. °You have trouble coughing up the mucus. °Your cough keeps you awake at night. °You have a  fever. °Get help right away if you: °Cough up blood. °Feel pain in your chest. °Have severe shortness of breath. °Faint or keep feeling like you are going to faint. °Have a severe headache. °Have a fever or chills that get worse. °These symptoms may represent a serious problem that is an emergency. Do not wait to see if the symptoms will go away. Get medical help right away. Call your local emergency services (911 in the [place] Do not drive yourself to the hospital. °Summary °Acute bronchitis is inflammation of the main airways (bronchi) that come off the windpipe (trachea) in the lungs. The swelling causes the airways to get smaller and make more mucus than normal. °Drinking more fluids can help thin your mucus so it is easier to cough up. °Take over-the-counter and prescription medicines only as told by your health care provider. °Do not use any products that contain nicotine or tobacco. These products include cigarettes, chewing tobacco, and vaping devices, such as e-cigarettes. If you need help quitting, ask your health care provider. °Contact a health care provider if your symptoms do not improve after 2 weeks. °This information is not intended to replace advice given to you by your health care provider. Make sure you discuss any questions you have with your health care provider. °Document Revised: 06/20/2020 Document Reviewed: 06/20/2020 °Elsevier Patient Education © 2022 [employer] ° °

## 2021-02-07 ENCOUNTER — Telehealth: Payer: Self-pay | Admitting: Nurse Practitioner

## 2021-02-07 ENCOUNTER — Encounter: Payer: Self-pay | Admitting: Nurse Practitioner

## 2021-02-07 LAB — NOVEL CORONAVIRUS, NAA: SARS-CoV-2, NAA: NOT DETECTED

## 2021-02-07 NOTE — Telephone Encounter (Signed)
PCR for COVID negative. Sending Mychart note. Voicemail not set up

## 2021-02-14 ENCOUNTER — Encounter: Payer: Self-pay | Admitting: Medical

## 2021-03-07 ENCOUNTER — Other Ambulatory Visit: Payer: Self-pay | Admitting: Internal Medicine

## 2021-03-08 NOTE — Telephone Encounter (Signed)
Requested Prescriptions  Pending Prescriptions Disp Refills   diclofenac (VOLTAREN) 75 MG EC tablet [Pharmacy Med Name: Diclofenac Sodium 75 MG Oral Tablet Delayed Release] 60 tablet 0    Sig: Take 1 tablet by mouth twice daily     Analgesics:  NSAIDS Passed - 03/07/2021  1:45 PM      Passed - Cr in normal range and within 360 days    Creatinine, Ser  Date Value Ref Range Status  11/16/2020 0.82 0.44 - 1.00 mg/dL Final         Passed - HGB in normal range and within 360 days    Hemoglobin  Date Value Ref Range Status  11/16/2020 14.3 12.0 - 15.0 g/dL Final  07/05/2019 15.6 11.1 - 15.9 g/dL Final         Passed - Patient is not pregnant      Passed - Valid encounter within last 12 months    Recent Outpatient Visits          3 months ago Hypokalemia   [place], NP   6 months ago [facility] [place] [place], Coralie Keens, NP   6 months ago Acute viral syndrome   [place], Devonne Doughty, DO

## 2021-03-10 ENCOUNTER — Encounter: Payer: Self-pay | Admitting: Internal Medicine

## 2021-03-11 ENCOUNTER — Other Ambulatory Visit: Payer: Self-pay | Admitting: Internal Medicine

## 2021-03-11 NOTE — Telephone Encounter (Signed)
Requested medication (s) are due for refill today Undetermined  Requested medication (s) are on the active medication list No-not this needle size  Future visit scheduled No  Note to clinic-unable to reach patient to verify if these are the correct size as they are not the size listed on medication list. Attempted to contact pharmacy-no answer after a long hold. Routing to office for review.    Requested Prescriptions  Pending Prescriptions Disp Refills   BD PEN NEEDLE MICRO U/F 32G X 6 MM MISC [Pharmacy Med Name: BD [id number] MIS] 100 each 0    Sig: 1  IN THE MORNING AND 1 AT BEDTIME     Endocrinology: Diabetes - Testing Supplies Passed - 03/11/2021  5:31 AM      Passed - Valid encounter within last 12 months    Recent Outpatient Visits           3 months ago Hypokalemia   [facility] [place], Coralie Keens, NP   6 months ago [facility] [place], Coralie Keens, NP   6 months ago Acute viral syndrome   [place], Devonne Doughty, [place]

## 2021-03-12 MED ORDER — GLIPIZIDE 10 MG PO TABS: 10.0000 mg | ORAL_TABLET | Freq: Two times a day (BID) | ORAL | 0 refills | Status: DC

## 2021-03-12 MED ORDER — OZEMPIC (0.25 OR 0.5 MG/DOSE) 2 MG/1.5ML ~~LOC~~ SOPN: 2.0000 mg | PEN_INJECTOR | SUBCUTANEOUS | 0 refills | Status: DC

## 2021-03-12 MED ORDER — LISINOPRIL 10 MG PO TABS: 30.0000 mg | ORAL_TABLET | Freq: Every day | ORAL | 2 refills | Status: DC

## 2021-03-12 NOTE — Telephone Encounter (Signed)
Patient response.  KP

## 2021-04-05 ENCOUNTER — Encounter: Payer: Self-pay | Admitting: Internal Medicine

## 2021-04-05 ENCOUNTER — Ambulatory Visit (INDEPENDENT_AMBULATORY_CARE_PROVIDER_SITE_OTHER): Payer: BC Managed Care – PPO | Admitting: Internal Medicine

## 2021-04-05 ENCOUNTER — Other Ambulatory Visit: Payer: Self-pay

## 2021-04-05 VITALS — BP 127/66 | HR 73 | Temp 97.5°F | Ht 63.0 in | Wt 212.0 lb

## 2021-04-05 DIAGNOSIS — Z1159 Encounter for screening for other viral diseases: Secondary | ICD-10-CM

## 2021-04-05 DIAGNOSIS — Z0001 Encounter for general adult medical examination with abnormal findings: Secondary | ICD-10-CM

## 2021-04-05 DIAGNOSIS — Z6837 Body mass index (BMI) 37.0-37.9, adult: Secondary | ICD-10-CM

## 2021-04-05 DIAGNOSIS — Z114 Encounter for screening for human immunodeficiency virus [HIV]: Secondary | ICD-10-CM | POA: Diagnosis not present

## 2021-04-05 NOTE — Progress Notes (Signed)
Subjective:    Patient ID: Sheri Hood, female    DOB: August 02, 1965, 56 y.o.   MRN: [id number]  HPI  Patient presents the clinic today for annual exam.  Flu: 11/2018 Tetanus: 01/2014 COVID: Never Pneumovax: 01/2017 Shingrix: Never Pap smear: 01/2017 Mammogram: 01/2021 Colon screening: 05/2018 Vision screening: annually Dentist: biannually  Diet: She does eat meat. She consumes more veggies than fruits. She does eat some fried foods. She drinks mostly coffee, dt. coke Exercise: None  Review of Systems  Past Medical History:  Diagnosis Date   Allergy    Anemia    s/p ablation for heavy bleeding    Anxiety    Arthritis    arhtritis middle back, scolosis   Asthma    Depression    Diabetes mellitus without complication ([place])    GERD (gastroesophageal reflux disease)    Headache(784.0)    h/x migraines   Heart murmur    recent diagnosed- / ehho 12/30/10 report on chart   Hiatal hernia    Hyperlipidemia    Hypertension    Recurrent upper respiratory infection (URI)    occ sore throat and slightly stuffy- no fever or cough   Sleep apnea    On CPAP   Sleep apnea     Current Outpatient Medications  Medication Sig Dispense Refill   albuterol (VENTOLIN HFA) 108 (90 Base) MCG/ACT inhaler Inhale 1-2 puffs into the lungs every 6 (six) hours as needed for wheezing or shortness of breath. 18 g 0   BD PEN NEEDLE MICRO U/F 32G X 6 MM MISC 1  IN THE MORNING AND 1 AT BEDTIME 100 each 0   citalopram (CELEXA) 40 MG tablet Take 1 tablet by mouth once daily 90 tablet 0   Continuous Blood Gluc Receiver (FREESTYLE LIBRE 14 DAY READER) DEVI 1 Device by Does not apply route every morning. 1 each 2   Continuous Blood Gluc Sensor (FREESTYLE LIBRE 14 DAY SENSOR) MISC 1 Device by Does not apply route every 14 (fourteen) days. 6 each 3   diclofenac (VOLTAREN) 75 MG EC tablet Take 1 tablet by mouth twice daily 60 tablet 0   doxycycline (VIBRA-TABS) 100 MG tablet Take 1 tablet (100 mg total)  by mouth 2 (two) times daily. 14 tablet 0   glipiZIDE (GLUCOTROL) 10 MG tablet Take 1 tablet (10 mg total) by mouth 2 (two) times daily before a meal. 180 tablet 0   glucose blood (BAYER CONTOUR NEXT TEST) test strip Use as instructed to test blood sugar once daily. E11.9 (Patient not taking: Reported on 11/22/2020) 100 each 12   hydrochlorothiazide (HYDRODIURIL) 25 MG tablet Take 1 tablet (25 mg total) by mouth daily. 90 tablet 1   ibuprofen (ADVIL,MOTRIN) 200 MG tablet Take 200 mg by mouth every 6 (six) hours as needed. For pain     Insulin Glargine (LANTUS SOLOSTAR) 100 UNIT/ML Solostar Pen Inject 34 Units into the skin at bedtime. (Patient not taking: Reported on 11/22/2020) 10 pen 1   Insulin Pen Needle (B-D UF III MINI PEN NEEDLES) 31G X 5 MM MISC 1 each by Other route in the morning and at bedtime. 100 each 2   lisinopril (ZESTRIL) 10 MG tablet Take 3 tablets (30 mg total) by mouth daily. 90 tablet 2   Microlet Lancets MISC USE AS DIRECTED 1 ONCE DAILY TO TEST BLOOD SUGAR 100 each 0   omeprazole (PRILOSEC) 20 MG capsule Take 20 mg by mouth daily.     predniSONE (STERAPRED UNI-PAK  21 TAB) 10 MG (21) TBPK tablet Take 6 tablets today by mouth then 5 tablets tomorrow, then one less tablet everyday thereafter. 21 tablet 0   rosuvastatin (CRESTOR) 10 MG tablet Take 1 tablet (10 mg total) by mouth daily. 90 tablet 1   Semaglutide,0.25 or 0.5MG/DOS, (OZEMPIC, 0.25 OR 0.5 MG/DOSE,) 2 MG/1.5ML SOPN Inject 2 mg into the skin once a week. 18 mL 0   SEMGLEE, YFGN, 100 UNIT/ML Pen SMARTSIG:50 Unit(s) SUB-Q As Directed     vitamin E 180 MG (400 UNITS) capsule Take by mouth. (Patient not taking: Reported on 11/22/2020)     vitamin E 400 UNIT capsule Take 800 Units by mouth at bedtime.     No current facility-administered medications for this visit.    Allergies  Allergen Reactions   Benadryl [Diphenhydramine Hcl] Other (See Comments)    Keeps awake, jittery   Codeine Other (See Comments)    Keeps awake    Dilaudid [Hydromorphone Hcl] Itching and Nausea Only   Morphine And Related Itching and Nausea Only    Family History  Problem Relation Age of Onset   Diabetes Father    Breast cancer Neg Hx    Colon cancer Neg Hx    Esophageal cancer Neg Hx    Rectal cancer Neg Hx    Stomach cancer Neg Hx     Social History   Socioeconomic History   Marital status: Married    Spouse name: Not on file   Number of children: Not on file   Years of education: Not on file   Highest education level: Not on file  Occupational History   Not on file  Tobacco Use   Smoking status: Former    Packs/day: 0.50    Years: 4.00    Pack years: 2.00    Types: Cigarettes    Quit date: 01/02/1988    Years since quitting: 33.2   Smokeless tobacco: Never  Substance and Sexual Activity   Alcohol use: Yes    Alcohol/week: 0.0 standard drinks    Comment: Occasional Drink   Drug use: No   Sexual activity: Never  Other Topics Concern   Not on file  Social History Narrative   Not on file   Social Determinants of Health   Financial Resource Strain: Not on file  Food Insecurity: Not on file  Transportation Needs: Not on file  Physical Activity: Not on file  Stress: Not on file  Social Connections: Not on file  Intimate Partner Violence: Not on file     Constitutional: Denies fever, malaise, fatigue, headache or abrupt weight changes.  HEENT: Denies eye pain, eye redness, ear pain, ringing in the ears, wax buildup, runny nose, nasal congestion, bloody nose, or sore throat. Respiratory: Denies difficulty breathing, shortness of breath, cough or sputum production.   Cardiovascular: Denies chest pain, chest tightness, palpitations or swelling in the hands or feet.  Gastrointestinal: Pt reports constipation. Denies abdominal pain, bloating, constipation, diarrhea or blood in the stool.  GU: Denies urgency, frequency, pain with urination, burning sensation, blood in urine, odor or  discharge. Musculoskeletal: Patient reports joint pain.  Denies decrease in range of motion, difficulty with gait, muscle pain or joint swelling.  Skin: Denies redness, rashes, lesions or ulcercations.  Neurological: Denies dizziness, difficulty with memory, difficulty with speech or problems with balance and coordination.  Psych: Patient has a history of anxiety and depression.  Denies SI/HI.  No other specific complaints in a complete review of systems (except  as listed in HPI above).     Objective:   Physical Exam  BP 127/66 (BP Location: Right Arm, Patient Position: Sitting, Cuff Size: Large)    Pulse 73    Temp (!) 97.5 F (36.4 C) (Temporal)    Ht 5' 3"  (1.6 m)    Wt 212 lb (96.2 kg)    SpO2 100%    BMI 37.55 kg/m   Wt Readings from Last 3 Encounters:  11/22/20 212 lb 6.4 oz (96.3 kg)  11/16/20 214 lb 15.2 oz (97.5 kg)  08/28/20 215 lb (97.5 kg)    General: Appears her stated age, obese, in NAD. Skin: Warm, dry and intact. No ulcerations noted. HEENT: Head: normal shape and size; Eyes: sclera white and EOMs intact;  Neck:  Neck supple, trachea midline. No masses, lumps or thyromegaly present.  Cardiovascular: Normal rate and rhythm. S1,S2 noted.  No murmur, rubs or gallops noted. No JVD or BLE edema. No carotid bruits noted. Pulmonary/Chest: Normal effort and positive vesicular breath sounds. No respiratory distress. No wheezes, rales or ronchi noted.  Abdomen:  Normal bowel sounds.  Musculoskeletal: Strength 5/5 BUE/BLE. No difficulty with gait.  Neurological: Alert and oriented. Cranial nerves II-XII grossly intact. Coordination normal.  Psychiatric: Mood and affect normal. Behavior is normal. Judgment and thought content normal.   BMET    Component Value Date/Time   NA 137 11/16/2020 1259   NA 139 07/05/2019 1327   K 3.4 (L) 11/16/2020 1259   CL 101 11/16/2020 1259   CO2 27 11/16/2020 1259   GLUCOSE 127 (H) 11/16/2020 1259   BUN 22 (H) 11/16/2020 1259   BUN 15  07/05/2019 1327   CREATININE 0.82 11/16/2020 1259   CALCIUM 9.3 11/16/2020 1259   GFRNONAA >60 11/16/2020 1259   GFRAA 94 07/05/2019 1327    Lipid Panel     Component Value Date/Time   CHOL 188 07/05/2019 1327   TRIG 148 07/05/2019 1327   HDL 46 07/05/2019 1327   CHOLHDL 4.1 07/05/2019 1327   LDLCALC 116 (H) 07/05/2019 1327    CBC    Component Value Date/Time   WBC 4.8 11/16/2020 1259   RBC 4.84 11/16/2020 1259   HGB 14.3 11/16/2020 1259   HGB 15.6 07/05/2019 1327   HCT 40.9 11/16/2020 1259   HCT 47.2 (H) 07/05/2019 1327   PLT 174 11/16/2020 1259   PLT 174 07/05/2019 1327   MCV 84.5 11/16/2020 1259   MCV 86 07/05/2019 1327   MCH 29.5 11/16/2020 1259   MCHC 35.0 11/16/2020 1259   RDW 13.4 11/16/2020 1259   RDW 12.7 07/05/2019 1327   LYMPHSABS 1.7 01/12/2017 1324   EOSABS 0.1 01/12/2017 1324   BASOSABS 0.0 01/12/2017 1324    Hgb A1C Lab Results  Component Value Date   HGBA1C 7.3 10/17/2020           Assessment & Plan:   Preventative Health Maintenance:  She declines flu shot today Tetanus UTD Pneumovax due 01/2022 Encouraged her to get her COVID-vaccine Discussed Shingrix, she will check coverage with her insurance company Pap smear due 01/2022 Mammogram due 01/2022 Encouraged her to consume a balanced diet and exercise regimen Advised her to see an eye doctor and dentist annually We will check CBC, c-Met, lipid, A1c, urine microalbumin, HIV and hep C today  RTC in 6 months, follow-up chronic conditions Webb Silversmith, NP This visit occurred during the SARS-CoV-2 public health emergency.  Safety protocols were in place, including screening questions prior to the  visit, additional usage of staff PPE, and extensive cleaning of exam room while observing appropriate contact time as indicated for disinfecting solutions.

## 2021-04-05 NOTE — Assessment & Plan Note (Signed)
Encouraged diet and exercise for weight loss ?

## 2021-04-05 NOTE — Patient Instructions (Signed)

## 2021-04-08 ENCOUNTER — Other Ambulatory Visit: Payer: Self-pay | Admitting: Internal Medicine

## 2021-04-09 NOTE — Telephone Encounter (Signed)
Requested Prescriptions  Pending Prescriptions Disp Refills   diclofenac (VOLTAREN) 75 MG EC tablet [Pharmacy Med Name: Diclofenac Sodium 75 MG Oral Tablet Delayed Release] 60 tablet 0    Sig: Take 1 tablet by mouth twice daily     Analgesics:  NSAIDS Failed - 04/08/2021 10:02 PM      Failed - Manual Review: Labs are only required if the patient has taken medication for more than 8 weeks.      Passed - Cr in normal range and within 360 days    Creatinine, Ser  Date Value Ref Range Status  11/16/2020 0.82 0.44 - 1.00 mg/dL Final         Passed - HGB in normal range and within 360 days    Hemoglobin  Date Value Ref Range Status  11/16/2020 14.3 12.0 - 15.0 g/dL Final  07/05/2019 15.6 11.1 - 15.9 g/dL Final         Passed - PLT in normal range and within 360 days    Platelets  Date Value Ref Range Status  11/16/2020 174 150 - 400 K/uL Final  07/05/2019 174 150 - 450 x10E3/uL Final         Passed - HCT in normal range and within 360 days    HCT  Date Value Ref Range Status  11/16/2020 40.9 36.0 - 46.0 % Final   Hematocrit  Date Value Ref Range Status  07/05/2019 47.2 (H) 34.0 - 46.6 % Final         Passed - eGFR is 30 or above and within 360 days    GFR calc Af Amer  Date Value Ref Range Status  07/05/2019 94 >59 mL/min/1.73 Final    Comment:    **[facility] currently reports eGFR in compliance with the current**   recommendations of the [employer]. [facility] will   update reporting as new guidelines are published from the NKF-ASN   Task force.    GFR, Estimated  Date Value Ref Range Status  11/16/2020 >60 >60 mL/min Final    Comment:    (NOTE) Calculated using the CKD-EPI Creatinine Equation (2021)          Passed - Patient is not pregnant      Passed - Valid encounter within last 12 months    Recent Outpatient Visits          4 days ago Encounter for general adult medical examination with abnormal findings   [place], NP   4 months ago Hypokalemia   [place], NP   7 months ago [facility] [place], Coralie Keens, NP   7 months ago Acute viral syndrome   [place], Devonne Doughty, [place]

## 2021-04-17 ENCOUNTER — Other Ambulatory Visit: Payer: Self-pay | Admitting: Internal Medicine

## 2021-04-17 DIAGNOSIS — I10 Essential (primary) hypertension: Secondary | ICD-10-CM

## 2021-04-17 NOTE — Telephone Encounter (Signed)
Requested medications are due for refill today.  yes  Requested medications are on the active medications list.  yes  Last refill. 10/12/2020 #90 1 refill  Future visit scheduled.   no  Notes to clinic.  Failed protocol d/t abnormal lab.    Requested Prescriptions  Pending Prescriptions Disp Refills   hydrochlorothiazide (HYDRODIURIL) 25 MG tablet [Pharmacy Med Name: hydroCHLOROthiazide 25 MG Oral Tablet] 90 tablet 0    Sig: Take 1 tablet by mouth once daily     Cardiovascular: Diuretics - Thiazide Failed - 04/17/2021 10:41 AM      Failed - K in normal range and within 180 days    Potassium  Date Value Ref Range Status  11/16/2020 3.4 (L) 3.5 - 5.1 mmol/L Final          Passed - Cr in normal range and within 180 days    Creatinine, Ser  Date Value Ref Range Status  11/16/2020 0.82 0.44 - 1.00 mg/dL Final          Passed - Na in normal range and within 180 days    Sodium  Date Value Ref Range Status  11/16/2020 137 135 - 145 mmol/L Final  07/05/2019 139 134 - 144 mmol/L Final          Passed - Last BP in normal range    BP Readings from Last 1 Encounters:  04/05/21 127/66          Passed - Valid encounter within last 6 months    Recent Outpatient Visits           1 week ago Encounter for general adult medical examination with abnormal findings   [place], Coralie Keens, NP   4 months ago Hypokalemia   [place], NP   7 months ago [facility] [place], Coralie Keens, NP   7 months ago Acute viral syndrome   [place], Devonne Doughty, [place]

## 2021-04-25 ENCOUNTER — Other Ambulatory Visit: Payer: Self-pay | Admitting: Internal Medicine

## 2021-04-25 NOTE — Telephone Encounter (Signed)
Requested medication (s) are due for refill today:   Yes  Requested medication (s) are on the active medication list:   Yes  Future visit scheduled:   No  Had physical recently   Last ordered: No protocol attached to this medication reason returned   Requested Prescriptions  Pending Prescriptions Disp Refills   rosuvastatin (CRESTOR) 10 MG tablet [Pharmacy Med Name: Rosuvastatin Calcium 10 MG Oral Tablet] 90 tablet 0    Sig: Take 1 tablet by mouth once daily     There is no refill protocol information for this order

## 2021-05-03 ENCOUNTER — Encounter: Payer: Self-pay | Admitting: Internal Medicine

## 2021-05-03 NOTE — Telephone Encounter (Signed)
This is a duplicate message.  Pt Sheri Hood sent her own My Chart message.  ? ?Thanks,  ? ?-Mickel Baas  ?

## 2021-05-07 ENCOUNTER — Other Ambulatory Visit: Payer: Self-pay | Admitting: Internal Medicine

## 2021-05-08 NOTE — Telephone Encounter (Signed)
Requested Prescriptions  ?Pending Prescriptions Disp Refills  ?? citalopram (CELEXA) 40 MG tablet [Pharmacy Med Name: Citalopram Hydrobromide 40 MG Oral Tablet] 90 tablet 0  ?  Sig: Take 1 tablet by mouth once daily  ?  ? Psychiatry:  Antidepressants - SSRI Passed - 05/08/2021  2:30 PM  ?  ?  Passed - Completed PHQ-2 or PHQ-9 in the last 360 days  ?  ?  Passed - Valid encounter within last 6 months  ?  Recent Outpatient Visits   ?      ? 1 month ago Encounter for general adult medical examination with abnormal findings  ? [facility] [place], [place] W, NP  ? 5 months ago Hypokalemia  ? [facility] [place], [place] W, NP  ? 8 months ago COVID-19  ? [facility] [place], [place] W, NP  ? 8 months ago Acute viral syndrome  ? [place], DO  ?  ?  ? ?  ?  ?  ? ? ?

## 2021-05-16 ENCOUNTER — Other Ambulatory Visit: Payer: Self-pay | Admitting: Internal Medicine

## 2021-05-16 NOTE — Telephone Encounter (Signed)
Requested Prescriptions  ?Pending Prescriptions Disp Refills  ?? diclofenac (VOLTAREN) 75 MG EC tablet [Pharmacy Med Name: Diclofenac Sodium 75 MG Oral Tablet Delayed Release] 60 tablet 0  ?  Sig: Take 1 tablet by mouth twice daily  ?  ? Analgesics:  NSAIDS Failed - 05/16/2021  1:51 PM  ?  ?  Failed - Manual Review: Labs are only required if the patient has taken medication for more than 8 weeks.  ?  ?  Passed - Cr in normal range and within 360 days  ?  Creatinine, Ser  ?Date Value Ref Range Status  ?11/16/2020 0.82 0.44 - 1.00 mg/dL Final  ?   ?  ?  Passed - HGB in normal range and within 360 days  ?  Hemoglobin  ?Date Value Ref Range Status  ?11/16/2020 14.3 12.0 - 15.0 g/dL Final  ?07/05/2019 15.6 11.1 - 15.9 g/dL Final  ?   ?  ?  Passed - PLT in normal range and within 360 days  ?  Platelets  ?Date Value Ref Range Status  ?11/16/2020 174 150 - 400 K/uL Final  ?07/05/2019 174 150 - 450 x10E3/uL Final  ?   ?  ?  Passed - HCT in normal range and within 360 days  ?  HCT  ?Date Value Ref Range Status  ?11/16/2020 40.9 36.0 - 46.0 % Final  ? ?Hematocrit  ?Date Value Ref Range Status  ?07/05/2019 47.2 (H) 34.0 - 46.6 % Final  ?   ?  ?  Passed - eGFR is 30 or above and within 360 days  ?  GFR calc Af Amer  ?Date Value Ref Range Status  ?07/05/2019 94 >59 mL/min/1.73 Final  ?  Comment:  ?  **[facility] currently reports eGFR in compliance with the current** ?  recommendations of the [employer]. [facility] will ?  update reporting as new guidelines are published from the NKF-ASN ?  Task force. ?  ? ?GFR, Estimated  ?Date Value Ref Range Status  ?11/16/2020 >60 >60 mL/min Final  ?  Comment:  ?  (NOTE) ?Calculated using the CKD-EPI Creatinine Equation (2021) ?  ?   ?  ?  Passed - Patient is not pregnant  ?  ?  Passed - Valid encounter within last 12 months  ?  Recent Outpatient Visits   ?      ? 1 month ago Encounter for general adult medical examination with abnormal findings  ? [facility] [place],  Regina W, NP  ? 5 months ago Hypokalemia  ? [facility] [place], Regina W, NP  ? 8 months ago COVID-19  ? [facility] Baity, Regina W, NP  ? 8 months ago Acute viral syndrome  ? [facility] [place], Alexander J, DO  ?  ?  ? ?  ?  ?  ? ? ?

## 2021-05-30 ENCOUNTER — Encounter: Payer: Self-pay | Admitting: Internal Medicine

## 2021-05-30 ENCOUNTER — Other Ambulatory Visit: Payer: Self-pay | Admitting: Internal Medicine

## 2021-05-30 MED ORDER — BD PEN NEEDLE MICRO U/F 32G X 6 MM MISC: 0 refills | Status: DC

## 2021-06-10 ENCOUNTER — Other Ambulatory Visit: Payer: Self-pay | Admitting: Internal Medicine

## 2021-06-11 DIAGNOSIS — Z114 Encounter for screening for human immunodeficiency virus [HIV]: Secondary | ICD-10-CM | POA: Diagnosis not present

## 2021-06-11 DIAGNOSIS — Z0001 Encounter for general adult medical examination with abnormal findings: Secondary | ICD-10-CM | POA: Diagnosis not present

## 2021-06-11 DIAGNOSIS — Z1159 Encounter for screening for other viral diseases: Secondary | ICD-10-CM | POA: Diagnosis not present

## 2021-06-11 NOTE — Telephone Encounter (Signed)
Requested Prescriptions  ?Pending Prescriptions Disp Refills  ?? lisinopril (ZESTRIL) 10 MG tablet [Pharmacy Med Name: Lisinopril 10 MG Oral Tablet] 270 tablet 0  ?  Sig: Take 3 tablets by mouth once daily  ?  ? Cardiovascular:  ACE Inhibitors Failed - 06/10/2021 11:16 AM  ?  ?  Failed - Cr in normal range and within 180 days  ?  Creatinine, Ser  ?Date Value Ref Range Status  ?11/16/2020 0.82 0.44 - 1.00 mg/dL Final  ?   ?  ?  Failed - K in normal range and within 180 days  ?  Potassium  ?Date Value Ref Range Status  ?11/16/2020 3.4 (L) 3.5 - 5.1 mmol/L Final  ?   ?  ?  Passed - Patient is not pregnant  ?  ?  Passed - Last BP in normal range  ?  BP Readings from Last 1 Encounters:  ?04/05/21 127/66  ?   ?  ?  Passed - Valid encounter within last 6 months  ?  Recent Outpatient Visits   ?      ? 2 months ago Encounter for general adult medical examination with abnormal findings  ? [facility] [place], [place] W, NP  ? 6 months ago Hypokalemia  ? [facility] [place], [place] W, NP  ? 9 months ago COVID-19  ? [facility] [place], [place] W, NP  ? 9 months ago Acute viral syndrome  ? [place], DO  ?  ?  ? ?  ?  ?  ? ? ?

## 2021-06-14 LAB — COMPREHENSIVE METABOLIC PANEL
ALT: 39 IU/L — ABNORMAL HIGH (ref 0–32)
AST: 23 IU/L (ref 0–40)
Albumin/Globulin Ratio: 2.3 — ABNORMAL HIGH (ref 1.2–2.2)
Albumin: 4.9 g/dL (ref 3.8–4.9)
Alkaline Phosphatase: 72 IU/L (ref 44–121)
BUN/Creatinine Ratio: 24 — ABNORMAL HIGH (ref 9–23)
BUN: 29 mg/dL — ABNORMAL HIGH (ref 6–24)
Bilirubin Total: 0.4 mg/dL (ref 0.0–1.2)
CO2: 25 mmol/L (ref 20–29)
Calcium: 9.4 mg/dL (ref 8.7–10.2)
Chloride: 103 mmol/L (ref 96–106)
Creatinine, Ser: 1.19 mg/dL — ABNORMAL HIGH (ref 0.57–1.00)
Globulin, Total: 2.1 g/dL (ref 1.5–4.5)
Glucose: 68 mg/dL — ABNORMAL LOW (ref 70–99)
Potassium: 3.8 mmol/L (ref 3.5–5.2)
Sodium: 143 mmol/L (ref 134–144)
Total Protein: 7 g/dL (ref 6.0–8.5)
eGFR: 54 mL/min/{1.73_m2} — ABNORMAL LOW (ref >59)

## 2021-06-14 LAB — CBC
Hematocrit: 40.7 % (ref 34.0–46.6)
Hemoglobin: 13.7 g/dL (ref 11.1–15.9)
MCH: 29.1 pg (ref 26.6–33.0)
MCHC: 33.7 g/dL (ref 31.5–35.7)
MCV: 87 fL (ref 79–97)
Platelets: 176 10*3/uL (ref 150–450)
RBC: 4.7 x10E6/uL (ref 3.77–5.28)
RDW: 12.7 % (ref 11.7–15.4)
WBC: 5 10*3/uL (ref 3.4–10.8)

## 2021-06-14 LAB — MICROALBUMIN / CREATININE URINE RATIO
Creatinine, Urine: 139.7 mg/dL
Microalb/Creat Ratio: 12 mg/g creat (ref 0–29)
Microalbumin, Urine: 16.8 ug/mL

## 2021-06-14 LAB — HEPATITIS C ANTIBODY: Hep C Virus Ab: NONREACTIVE

## 2021-06-14 LAB — HEMOGLOBIN A1C
Est. average glucose Bld gHb Est-mCnc: 128 mg/dL
Hgb A1c MFr Bld: 6.1 % — ABNORMAL HIGH (ref 4.8–5.6)

## 2021-06-14 LAB — LIPID PANEL
Chol/HDL Ratio: 3.5 ratio (ref 0.0–4.4)
Cholesterol, Total: 131 mg/dL (ref 100–199)
HDL: 37 mg/dL — ABNORMAL LOW (ref >39)
LDL Chol Calc (NIH): 66 mg/dL (ref 0–99)
Triglycerides: 162 mg/dL — ABNORMAL HIGH (ref 0–149)
VLDL Cholesterol Cal: 28 mg/dL (ref 5–40)

## 2021-06-14 LAB — HIV ANTIBODY (ROUTINE TESTING W REFLEX): HIV Screen 4th Generation wRfx: NONREACTIVE

## 2021-06-20 ENCOUNTER — Ambulatory Visit: Payer: Self-pay

## 2021-06-20 NOTE — Telephone Encounter (Signed)
Third attempt to reach pt. Routing to practice for resolution by PCP per protocol. ?

## 2021-06-20 NOTE — Telephone Encounter (Signed)
Pt's potential UTI symptoms began today, wants to be seen with PCP today or tomorrow. Prefers not to go to a walk in clinic  ? ?Best contact: [phone] or work phone [phone]  ?Unable to leave message. ?

## 2021-06-20 NOTE — Telephone Encounter (Signed)
Second attempt to reach pt, unable to complete call at this time.  ?

## 2021-06-21 ENCOUNTER — Ambulatory Visit (INDEPENDENT_AMBULATORY_CARE_PROVIDER_SITE_OTHER): Payer: BC Managed Care – PPO | Admitting: Physician Assistant

## 2021-06-21 ENCOUNTER — Encounter: Payer: Self-pay | Admitting: Physician Assistant

## 2021-06-21 VITALS — BP 140/92 | HR 71 | Wt 209.4 lb

## 2021-06-21 DIAGNOSIS — L918 Other hypertrophic disorders of the skin: Secondary | ICD-10-CM

## 2021-06-21 DIAGNOSIS — R319 Hematuria, unspecified: Secondary | ICD-10-CM

## 2021-06-21 DIAGNOSIS — R3915 Urgency of urination: Secondary | ICD-10-CM | POA: Diagnosis not present

## 2021-06-21 DIAGNOSIS — N390 Urinary tract infection, site not specified: Secondary | ICD-10-CM

## 2021-06-21 LAB — POCT URINALYSIS DIPSTICK
Bilirubin, UA: NEGATIVE
Glucose, UA: NEGATIVE
Ketones, UA: NEGATIVE
Nitrite, UA: NEGATIVE
Protein, UA: POSITIVE — AB
Spec Grav, UA: 1.015 (ref 1.010–1.025)
Urobilinogen, UA: 0.2 E.U./dL
pH, UA: 6 (ref 5.0–8.0)

## 2021-06-21 MED ORDER — LIDOCAINE HCL (PF) 1 % IJ SOLN: 2.0000 mL | Freq: Once | INTRAMUSCULAR | Status: DC

## 2021-06-21 MED ORDER — NITROFURANTOIN MONOHYD MACRO 100 MG PO CAPS: 100.0000 mg | ORAL_CAPSULE | Freq: Two times a day (BID) | ORAL | 0 refills | Status: AC

## 2021-06-21 NOTE — Telephone Encounter (Signed)
Called pt and made appt for today.   ?

## 2021-06-21 NOTE — Patient Instructions (Addendum)
At this time I recommend the following for your UTI ? ?Take the antibiotic that I have sent in and complete the full course of the medication ?I have sent in Macrobid 100 mg to be take by mouth twice per day for 5 days ? ? ?Stay well hydrated ( at least 75 oz of water per day) ?I recommend reducing the use of your diclofenac and ibuprofen at this time. These can place strain on the kidneys and since you are concerned about your kidney function we should avoid medications that can cause them harm.  ?You can try to replace this with Tylenol instead for relief as needed  ?Continue taking your other medications as prescribed  ? ?It was nice to meet you and I appreciate the opportunity to be involved in your care ? ?

## 2021-06-21 NOTE — Progress Notes (Signed)
? ? ?  ?    Acute Office Visit ? ? ?Patient: Sheri Hood   DOB: 1965/06/03   56 y.o. Female  MRN: [id number] ?Visit Date: 06/21/2021 ? ?Today's healthcare provider: Dani Gobble Morganne Haile, PA-C  ?Introduced myself to the patient as a Journalist, newspaper and provided education on APPs in clinical practice.  ? ? ?Chief Complaint  ?Patient presents with  ? Urinary Tract Infection  ? ?Subjective  ?  ?Urinary Tract Infection  ?Associated symptoms include hematuria and urgency. Pertinent negatives include no chills or flank pain.   ? ?States she has had a feeling of pressure and bladder fullness for the past few weeks  ?Sometimes has dysuria or pelvic pain  ?Reports it got worse over the last few days- yesterday at work she felt like the pressure was getting worse ? ?Additional concern: Skin tag on her abdomen that has been irritated and is frequently bleeding from rubbing against her bra ?States she would like it removed today if possible.  ? ? ?Medications: ?Outpatient Medications Prior to Visit  ?Medication Sig  ? albuterol (VENTOLIN HFA) 108 (90 Base) MCG/ACT inhaler Inhale 1-2 puffs into the lungs every 6 (six) hours as needed for wheezing or shortness of breath.  ? citalopram (CELEXA) 40 MG tablet Take 1 tablet by mouth once daily  ? diclofenac (VOLTAREN) 75 MG EC tablet Take 1 tablet by mouth twice daily  ? glipiZIDE (GLUCOTROL) 10 MG tablet Take 1 tablet (10 mg total) by mouth 2 (two) times daily before a meal.  ? glucose blood (BAYER CONTOUR NEXT TEST) test strip Use as instructed to test blood sugar once daily. E11.9  ? hydrochlorothiazide (HYDRODIURIL) 25 MG tablet Take 1 tablet by mouth once daily  ? ibuprofen (ADVIL,MOTRIN) 200 MG tablet Take 200 mg by mouth every 6 (six) hours as needed. For pain  ? Insulin Pen Needle (B-D UF III MINI PEN NEEDLES) 31G X 5 MM MISC 1 each by Other route in the morning and at bedtime.  ? Insulin Pen Needle (BD PEN NEEDLE MICRO U/F) 32G X 6 MM MISC Use as directed.  ? lisinopril (ZESTRIL) 10 MG  tablet Take 3 tablets by mouth once daily  ? Microlet Lancets MISC USE AS DIRECTED 1 ONCE DAILY TO TEST BLOOD SUGAR  ? omeprazole (PRILOSEC) 20 MG capsule Take 20 mg by mouth daily.  ? rosuvastatin (CRESTOR) 10 MG tablet Take 1 tablet by mouth once daily  ? Semaglutide,0.25 or 0.'5MG'$ /DOS, (OZEMPIC, 0.25 OR 0.5 MG/DOSE,) 2 MG/1.5ML SOPN Inject 2 mg into the skin once a week.  ? SEMGLEE, YFGN, 100 UNIT/ML Pen SMARTSIG:50 Unit(s) SUB-Q As Directed  ? vitamin E 400 UNIT capsule Take 800 Units by mouth at bedtime.  ? ?No facility-administered medications prior to visit.  ? ? ?Review of Systems  ?Constitutional:  Negative for chills, diaphoresis, fatigue and fever.  ?Genitourinary:  Positive for dysuria, hematuria, pelvic pain and urgency. Negative for flank pain.  ?Neurological:  Positive for headaches. Negative for dizziness and light-headedness.  ? ? ?  Objective  ?  ?BP (!) 140/92   Pulse 71   Wt 209 lb 6.4 oz (95 kg)   SpO2 99%   BMI 37.09 kg/m?  ? ? ?Physical Exam ?Vitals reviewed.  ?Constitutional:   ?   Appearance: Normal appearance.  ?HENT:  ?   Head: Normocephalic and atraumatic.  ?Cardiovascular:  ?   Rate and Rhythm: Normal rate and regular rhythm.  ?   Pulses: Normal pulses.  ?  Heart sounds: Normal heart sounds.  ?Pulmonary:  ?   Effort: Pulmonary effort is normal. No respiratory distress.  ?   Breath sounds: Normal breath sounds. No wheezing, rhonchi or rales.  ?Abdominal:  ?   General: Abdomen is flat. Bowel sounds are normal.  ?   Palpations: Abdomen is soft.  ?Musculoskeletal:  ?   Cervical back: Normal range of motion and neck supple.  ?Skin: ? ?    ?Neurological:  ?   General: No focal deficit present.  ?   Mental Status: She is alert and oriented to person, place, and time.  ?Psychiatric:     ?   Mood and Affect: Mood normal.     ?   Behavior: Behavior normal.     ?   Thought Content: Thought content normal.     ?   Judgment: Judgment normal.  ?  ? ? ?Results for orders placed or performed in  visit on 06/21/21  ?POCT Urinalysis Dipstick  ?Result Value Ref Range  ? Color, UA Yellow   ? Clarity, UA Cloudy   ? Glucose, UA Negative Negative  ? Bilirubin, UA Negative   ? Ketones, UA Negative   ? Spec Grav, UA 1.015 1.010 - 1.025  ? Blood, UA Small +   ? pH, UA 6.0 5.0 - 8.0  ? Protein, UA Positive (A) Negative  ? Urobilinogen, UA 0.2 0.2 or 1.0 E.U./dL  ? Nitrite, UA Negative   ? Leukocytes, UA Large (3+) (A) Negative  ? Appearance    ? Odor    ? ? Assessment & Plan  ?  ? ?Problem List Items Addressed This Visit   ?None ?Visit Diagnoses   ? ? Urinary tract infection with hematuria, site unspecified    -  Primary ?Acute, new problem ?UA was +leukocytes, RBCs, protein ?Suspect this is from UTI based on results and symptoms  ?Will send specimen for culture ?Recommend treatment with Macrobid 100 mg PO BID x 5 days  ?Results of culture to dictate further management  ?Recommend she stay well hydrated and reduce usage of potential nephrotoxic substances ?Discussed reducing use of NSAIDs while she is awaiting kidney function results ?  ? Relevant Medications  ? nitrofurantoin, macrocrystal-monohydrate, (MACROBID) 100 MG capsule  ? Skin tag     ?Chronic, newly reported problem ?Patient indicated a small 42m skin tag on her RUQ of abdomen, stating it is frequently irritated and bleeds  ?She requests removal today ?Patient was placed in supine position, area directly beneath skin tag was numbed using Lidocaine 1% injection  ?Skin tag and area surrounding was treated with iodine solution  ?Skin tag was grasped firmly under traction was cut away from skin ?Sample was placed in specimen container and will await patho report ?Suspect this to be benign skin growth but will refer to derm as needed pending results ?Follow up as needed  ?  ? Relevant Orders  ? PR EXC SKIN BENIG >4 CM TRUNK,ARM,LEG  ? Pathology ([facility])  ? Urinary urgency     ?Acute, new problem ?Patient reports she has had symptoms of pressure and mild  discomfort around her lower abdomen/ bladder for the past few weeks ?States this seems to be getting worse and has noticed intermittent dysuria ?UA performed and indicates likely UTI ?Will send results for culture and susceptibility testing ?  ? Relevant Orders  ? POCT Urinalysis Dipstick (Completed)  ? Urine Culture  ? ?  ? ? ? ?No follow-ups on file. ? ? ?  I, Tal Kempker [employer], PA-C, have reviewed all documentation for this visit. The documentation on 06/21/21 for the exam, diagnosis, procedures, and orders are all accurate and complete. ? ? ?Koron Godeaux, MHS, PA-C ?[facility] ?[facility]  ? ? ?No follow-ups on file.  ?   ? ? ? ? ?

## 2021-06-22 LAB — URINE CULTURE
MICRO NUMBER:: 13296815
Result:: NO GROWTH
SPECIMEN QUALITY:: ADEQUATE

## 2021-07-01 DIAGNOSIS — E119 Type 2 diabetes mellitus without complications: Secondary | ICD-10-CM | POA: Diagnosis not present

## 2021-07-03 ENCOUNTER — Encounter: Payer: Self-pay | Admitting: Physician Assistant

## 2021-07-03 DIAGNOSIS — E1122 Type 2 diabetes mellitus with diabetic chronic kidney disease: Secondary | ICD-10-CM | POA: Diagnosis not present

## 2021-07-03 DIAGNOSIS — N1831 Chronic kidney disease, stage 3a: Secondary | ICD-10-CM | POA: Diagnosis not present

## 2021-07-05 ENCOUNTER — Encounter: Payer: Self-pay | Admitting: Internal Medicine

## 2021-07-08 NOTE — Progress Notes (Signed)
Please see the MyChart message reply(ies) for my assessment and plan.  ?  ?This patient gave consent for this Medical Advice Message and is aware that it may result in a bill to [employer], as well as the possibility of receiving a bill for a co-payment or deductible. They are an established patient, but are not seeking medical advice exclusively about a problem treated during an in person or video visit in the last seven days. I did not recommend an in person or video visit within seven days of my reply.  ?  ?I spent a total of 2 minutes cumulative time within 7 days through [employer]. ? ?Webb Silversmith, NP  ? ?

## 2021-07-30 ENCOUNTER — Other Ambulatory Visit: Payer: Self-pay | Admitting: Internal Medicine

## 2021-07-30 ENCOUNTER — Telehealth: Payer: Self-pay | Admitting: Internal Medicine

## 2021-07-30 ENCOUNTER — Encounter: Payer: Self-pay | Admitting: Internal Medicine

## 2021-07-30 NOTE — Telephone Encounter (Signed)
Pt states that her endocrinologist wants her PCP to continue managing her diabetes since she is doing well. She says Endo released her (Dr. Regino Bellow)   Wants this sent to  [place], [place] - [place]  [address] [place] [address]  Phone: [phone] Fax: [phone]   She is completely out and needs this today

## 2021-07-30 NOTE — Telephone Encounter (Signed)
Pt called in to let PCP know that she is using 50 units of the insulin, please advise.

## 2021-07-30 NOTE — Telephone Encounter (Signed)
Requested medication (s) are due for refill today: yes  Requested medication (s) are on the active medication list: yes  Last refill:  10/24/20  Future visit scheduled: no  Notes to clinic:  pt is asking if PCP can refill since endo released her. Please assess and see TE from today.     Requested Prescriptions  Pending Prescriptions Disp Refills   SEMGLEE, YFGN, 100 UNIT/ML Pen       Off-Protocol Failed - 07/30/2021 12:43 PM      Failed - Medication not assigned to a protocol, review manually.      Passed - Valid encounter within last 12 months    Recent Outpatient Visits           1 month ago Urinary tract infection with hematuria, site unspecified   [place], PA-C   3 months ago Encounter for general adult medical examination with abnormal findings   [place], NP   8 months ago Hypokalemia   [facility] [place], Coralie Keens, NP   10 months ago [facility] [place], Coralie Keens, [place]   11 months ago Acute viral syndrome   [place], Devonne Dought[place], [place]

## 2021-07-30 NOTE — Telephone Encounter (Signed)
I need to know how many units she is using

## 2021-07-31 MED ORDER — SEMGLEE (YFGN) 100 UNIT/ML ~~LOC~~ SOPN: 50.0000 [IU] | PEN_INJECTOR | Freq: Every day | SUBCUTANEOUS | 0 refills | Status: DC

## 2021-08-05 ENCOUNTER — Other Ambulatory Visit: Payer: Self-pay | Admitting: Internal Medicine

## 2021-08-06 MED ORDER — CITALOPRAM HYDROBROMIDE 40 MG PO TABS
40.0000 mg | ORAL_TABLET | Freq: Every day | ORAL | 0 refills | Status: DC
Start: 2021-08-06 — End: 2021-11-05

## 2021-08-22 ENCOUNTER — Other Ambulatory Visit: Payer: Self-pay | Admitting: Internal Medicine

## 2021-08-23 MED ORDER — BD PEN NEEDLE MICRO U/F 32G X 6 MM MISC
0 refills | Status: DC
Start: 2021-08-23 — End: 2021-11-22

## 2021-08-29 ENCOUNTER — Other Ambulatory Visit: Payer: Self-pay | Admitting: Internal Medicine

## 2021-08-29 MED ORDER — ROSUVASTATIN CALCIUM 10 MG PO TABS: 10.0000 mg | ORAL_TABLET | Freq: Every day | ORAL | 0 refills | Status: DC

## 2021-09-09 ENCOUNTER — Other Ambulatory Visit: Payer: Self-pay | Admitting: Internal Medicine

## 2021-09-09 MED ORDER — LISINOPRIL 10 MG PO TABS: 30.0000 mg | ORAL_TABLET | Freq: Every day | ORAL | 0 refills | Status: DC

## 2021-10-04 ENCOUNTER — Encounter: Payer: Self-pay | Admitting: Internal Medicine

## 2021-10-10 ENCOUNTER — Other Ambulatory Visit: Payer: Self-pay | Admitting: Internal Medicine

## 2021-10-10 DIAGNOSIS — I10 Essential (primary) hypertension: Secondary | ICD-10-CM

## 2021-10-10 NOTE — Telephone Encounter (Signed)
Requested Prescriptions  Pending Prescriptions Disp Refills  . hydrochlorothiazide (HYDRODIURIL) 25 MG tablet [Pharmacy Med Name: hydroCHLOROthiazide 25 MG Oral Tablet] 90 tablet 0    Sig: Take 1 tablet by mouth once daily     Cardiovascular: Diuretics - Thiazide Failed - 10/10/2021  9:34 AM      Failed - Cr in normal range and within 180 days    Creatinine, Ser  Date Value Ref Range Status  06/11/2021 1.19 (H) 0.57 - 1.00 mg/dL Final         Failed - Last BP in normal range    BP Readings from Last 1 Encounters:  06/21/21 (!) 140/92         Failed - Valid encounter within last 6 months    Recent Outpatient Visits          3 months ago Urinary tract infection with hematuria, site unspecified   [facility] [place], Dani Gobble, PA-C   6 months ago Encounter for general adult medical examination with abnormal findings   [facility] [place], Coralie Keens, NP   10 months ago Hypokalemia   [facility] [place], Coralie Keens, NP   1 year ago [facility] [place], NP   1 year ago Acute viral syndrome   [place], DO             Passed - K in normal range and within 180 days    Potassium  Date Value Ref Range Status  06/11/2021 3.8 3.5 - 5.2 mmol/L Final         Passed - Na in normal range and within 180 days    Sodium  Date Value Ref Range Status  06/11/2021 143 134 - 144 mmol/L Final

## 2021-10-22 ENCOUNTER — Other Ambulatory Visit: Payer: Self-pay | Admitting: Internal Medicine

## 2021-10-22 ENCOUNTER — Encounter: Payer: Self-pay | Admitting: Internal Medicine

## 2021-10-22 DIAGNOSIS — I10 Essential (primary) hypertension: Secondary | ICD-10-CM

## 2021-10-22 MED ORDER — HYDROCHLOROTHIAZIDE 25 MG PO TABS: 25.0000 mg | ORAL_TABLET | Freq: Every day | ORAL | 0 refills | Status: DC

## 2021-10-27 ENCOUNTER — Encounter: Payer: Self-pay | Admitting: Internal Medicine

## 2021-10-27 ENCOUNTER — Other Ambulatory Visit: Payer: Self-pay | Admitting: Internal Medicine

## 2021-10-28 MED ORDER — SEMGLEE (YFGN) 100 UNIT/ML ~~LOC~~ SOPN: 50.0000 [IU] | PEN_INJECTOR | Freq: Every day | SUBCUTANEOUS | 0 refills | Status: DC

## 2021-10-28 NOTE — Telephone Encounter (Signed)
Refilled 10/28/2021 33m 0 refills - confirmed by pharmacy. Requested Prescriptions  Pending Prescriptions Disp Refills  . SEMGLEE, YFGN, 100 UNIT/ML Pen [Pharmacy Med Name: Semglee (yfgn) 100 UNIT/ML Subcutaneous Solution Pen-injector] 15 mL 0    Sig: INJECT 50 UNITS SUBCUTANEOUSLY ONCE DAILY     Off-Protocol Failed - 10/27/2021  9:32 PM      Failed - Medication not assigned to a protocol, review manually.      Passed - Valid encounter within last 12 months    Recent Outpatient Visits          4 months ago Urinary tract infection with hematuria, site unspecified   [place]   6 months ago [facility] general adult medical examination with abnormal findings   [facility]   11 months ago [place]okal[place]   [facility]   1 year ago Acute viral syndrome   [place]

## 2021-11-02 ENCOUNTER — Other Ambulatory Visit: Payer: Self-pay | Admitting: Internal Medicine

## 2021-11-05 NOTE — Telephone Encounter (Signed)
Courtesy refill Requested Prescriptions  Pending Prescriptions Disp Refills  . citalopram (CELEXA) 40 MG tablet [Pharmacy Med Name: Citalopram Hydrobromide 40 MG Oral Tablet] 90 tablet 0    Sig: Take 1 tablet by mouth once daily     Psychiatry:  Antidepressants - SSRI Failed - 11/02/2021  3:19 PM      Failed - Valid encounter within last 6 months    Recent Outpatient Visits          4 months ago Urinary tract infection with hematuria, site unspecified   [place], PA-C   7 months ago Encounter for general adult medical examination with abnormal findings   [facility] [place], Coralie Keens, NP   11 months ago Hypokalemia   [facility] [place], Coralie Keens, NP   1 year ago [facility] [place], Coralie Keens, NP   1 year ago Acute viral syndrome   [place], DO             Passed - Completed PHQ-2 or PHQ-9 in the last 360 days

## 2021-11-22 ENCOUNTER — Other Ambulatory Visit: Payer: Self-pay | Admitting: Internal Medicine

## 2021-11-23 MED ORDER — BD PEN NEEDLE MICRO U/F 32G X 6 MM MISC: 0 refills | Status: DC

## 2021-12-06 ENCOUNTER — Encounter: Payer: Self-pay | Admitting: Internal Medicine

## 2021-12-06 ENCOUNTER — Ambulatory Visit: Payer: BC Managed Care – PPO | Admitting: Internal Medicine

## 2021-12-06 VITALS — BP 106/62 | HR 80 | Temp 97.1°F | Wt 182.0 lb

## 2021-12-06 DIAGNOSIS — Z124 Encounter for screening for malignant neoplasm of cervix: Secondary | ICD-10-CM

## 2021-12-06 DIAGNOSIS — E1165 Type 2 diabetes mellitus with hyperglycemia: Secondary | ICD-10-CM | POA: Diagnosis not present

## 2021-12-06 DIAGNOSIS — E119 Type 2 diabetes mellitus without complications: Secondary | ICD-10-CM | POA: Diagnosis not present

## 2021-12-06 DIAGNOSIS — Z6832 Body mass index (BMI) 32.0-32.9, adult: Secondary | ICD-10-CM

## 2021-12-06 DIAGNOSIS — I10 Essential (primary) hypertension: Secondary | ICD-10-CM

## 2021-12-06 DIAGNOSIS — Z23 Encounter for immunization: Secondary | ICD-10-CM | POA: Diagnosis not present

## 2021-12-06 DIAGNOSIS — F32A Depression, unspecified: Secondary | ICD-10-CM

## 2021-12-06 DIAGNOSIS — M1990 Unspecified osteoarthritis, unspecified site: Secondary | ICD-10-CM

## 2021-12-06 DIAGNOSIS — K219 Gastro-esophageal reflux disease without esophagitis: Secondary | ICD-10-CM

## 2021-12-06 DIAGNOSIS — L255 Unspecified contact dermatitis due to plants, except food: Secondary | ICD-10-CM | POA: Diagnosis not present

## 2021-12-06 DIAGNOSIS — F419 Anxiety disorder, unspecified: Secondary | ICD-10-CM

## 2021-12-06 DIAGNOSIS — E7800 Pure hypercholesterolemia, unspecified: Secondary | ICD-10-CM

## 2021-12-06 DIAGNOSIS — J4520 Mild intermittent asthma, uncomplicated: Secondary | ICD-10-CM

## 2021-12-06 DIAGNOSIS — E6609 Other obesity due to excess calories: Secondary | ICD-10-CM

## 2021-12-06 DIAGNOSIS — G4733 Obstructive sleep apnea (adult) (pediatric): Secondary | ICD-10-CM

## 2021-12-06 LAB — POCT GLYCOSYLATED HEMOGLOBIN (HGB A1C): HbA1c, POC (controlled diabetic range): 5.7 % (ref 0.0–7.0)

## 2021-12-06 MED ORDER — TRIAMCINOLONE ACETONIDE 0.1 % EX CREA: 1.0000 | TOPICAL_CREAM | Freq: Two times a day (BID) | CUTANEOUS | 0 refills | Status: DC

## 2021-12-06 MED ORDER — METHYLPREDNISOLONE ACETATE 80 MG/ML IJ SUSP
80.0000 mg | Freq: Once | INTRAMUSCULAR | Status: AC
  Administered 2021-12-06: 80 mg via INTRAMUSCULAR

## 2021-12-06 NOTE — Assessment & Plan Note (Addendum)
Encourage weight loss as this can help reduce reflux symptoms Continue omeprazole 

## 2021-12-06 NOTE — Assessment & Plan Note (Signed)
Encourage weight loss as this can help reduce sleep apnea symptoms Continue CPAP 

## 2021-12-06 NOTE — Assessment & Plan Note (Signed)
POCT A1c 5.7% Urine microalbumin has been checked within the last year Continue Semglee and Ozempic Advised her to monitor sugars and decrease Semglee by 2 units every 2 days to prevent hypoglycemia Encourage low-carb diet and exercise for weight loss Eye exam scheduled Encourage routine foot exam Flu and Pneumovax today Encourage her to get her COVID booster

## 2021-12-06 NOTE — Assessment & Plan Note (Signed)
Encourage weight loss as this can help reduce joint pain Continue Tylenol as needed

## 2021-12-06 NOTE — Assessment & Plan Note (Signed)
Encourage diet and exercise weight loss 

## 2021-12-06 NOTE — Assessment & Plan Note (Signed)
C-Met and lipid profile today Encouraged her to consume a low-fat diet Continue rosuvastatin 

## 2021-12-06 NOTE — Patient Instructions (Signed)
Pap Test Why am I having this test? A Pap test, also called a Pap smear, is a screening test to check for signs of: Infection. Cancer of the cervix. The cervix is the lower part of the uterus that opens into the vagina. Changes that may be a sign that cancer is developing (precancerous changes). Women need this test on a regular basis. In general, you should have a Pap test every 3 years until you reach menopause or age 54. Women aged 30-60 may choose to have their Pap test done at the same time as an HPV (human papillomavirus) test every 5 years (instead of every 3 years). Your health care provider may recommend having Pap tests more or less often depending on your medical conditions and past Pap test results. What is being tested? Cervical cells are tested for signs of infection or abnormalities. What kind of sample is taken?  Your health care provider will collect a sample of cells from the surface of your cervix. This will be done using a small cotton swab, plastic spatula, or brush that is inserted into your vagina using a tool called a speculum. This sample is often collected during a pelvic exam, when you are lying on your back on an exam table with your feet in footrests (stirrups). In some cases, fluids (secretions) from the cervix or vagina may also be collected. How do I prepare for this test? Be aware of where you are in your menstrual cycle. If you are menstruating on the day of the test, you may be asked to reschedule. You may need to reschedule if you have a known vaginal infection on the day of the test. Follow instructions from your health care provider about: Changing or stopping your regular medicines. Some medicines can cause abnormal test results, such as vaginal medicines and tetracycline. Avoiding douching 2-3 days before or the day of the test. Tell a health care provider about: Any allergies you have. All medicines you are taking, including vitamins, herbs, eye drops,  creams, and over-the-counter medicines. Any bleeding problems you have. Any surgeries you have had. Any medical conditions you have. Whether you are pregnant or may be pregnant. How are the results reported? Your test results will be reported as either abnormal or normal. What do the results mean? A normal test result means that you do not have signs of cancer of the cervix. An abnormal result may mean that you have: Cancer. A Pap test by itself is not enough to diagnose cancer. You will have more tests done if cancer is suspected. Precancerous changes in your cervix. Inflammation of the cervix. An STI (sexually transmitted infection). A fungal infection. A parasite infection. Talk with your health care provider about what your results mean. In some cases, your health care provider may do more testing to confirm the results. Questions to ask your health care provider Ask your health care provider, or the department that is doing the test: When will my results be ready? How will I get my results? What are my treatment options? What other tests do I need? What are my next steps? Summary In general, women should have a Pap test every 3 years until they reach menopause or age 65. Your health care provider will collect a sample of cells from the surface of your cervix. This will be done using a small cotton swab, plastic spatula, or brush. In some cases, fluids (secretions) from the cervix or vagina may also be collected. This information is not  intended to replace advice given to you by your health care provider. Make sure you discuss any questions you have with your health care provider. Document Revised: 05/18/2020 Document Reviewed: 05/18/2020 Elsevier Patient Education  2023 [employer] Inc[employer]  

## 2021-12-06 NOTE — Assessment & Plan Note (Signed)
Controlled on lisinopril and HCTZ Reinforced DASH diet and exercise for weight loss C-Met today 

## 2021-12-06 NOTE — Progress Notes (Signed)
Subjective:    Patient ID: Sheri Hood, female    DOB: December 24, 1965, 56 y.o.   MRN: [id number]  HPI  Patient presents to clinic today for follow-up of chronic conditions.  Anxiety and Depression: Chronic, managed on Citalopram.  She is not currently seeing a therapist.  She denies SI/HI.  Asthma: She denies chronic cough or shortness of breath.  She uses Albuterol only as needed.  There are no PFTs on file.  OA: Generalized.  She takes Tylenol as needed with good relief of symptoms.  She does not follow with orthopedics.  GERD: Triggered by everything.  She denies breakthrough on Omeprazole.  There is no upper GI on file.  DM2: Her last A1c was 6.1%, 06/2021.  She is taking Semglee, Ozempic as prescribed.  She has not been checking her sugars. She checks her feet routinely.  Her last eye exam was 1 year ago.  Flu 11/2018.  Pneumovax 01/2017.  COVID never.  She follows with endocrinology.  OSA: She averages 7 hours of night with the use of her CPAP.  There is no sleep study on file.  HLD: Her last LDL was 66, triglycerides 122, 06/2021.  She is taking Rosuvastatin as prescribed.  She does not consume a low-fat diet.  HTN: Her BP today is 106/62.  She is taking Lisinopril and HCTZ as prescribed.  ECG from 11/2020 reviewed.  She needs her pap smear today as well.  Review of Systems     Past Medical History:  Diagnosis Date   Allergy    Anemia    s/p ablation for heavy bleeding    Anxiety    Arthritis    arhtritis middle back, scolosis   Asthma    Depression    Diabetes mellitus without complication ([place])    GERD (gastroesophageal reflux disease)    Headache(784.0)    h/x migraines   Heart murmur    recent diagnosed- / ehho 12/30/10 report on chart   Hiatal hernia    Hyperlipidemia    Hypertension    Recurrent upper respiratory infection (URI)    occ sore throat and slightly stuffy- no fever or cough   Sleep apnea    On CPAP   Sleep apnea     Current Outpatient  Medications  Medication Sig Dispense Refill   albuterol (VENTOLIN HFA) 108 (90 Base) MCG/ACT inhaler Inhale 1-2 puffs into the lungs every 6 (six) hours as needed for wheezing or shortness of breath. 18 g 0   citalopram (CELEXA) 40 MG tablet Take 1 tablet (40 mg total) by mouth daily. Schedule an Office Visit for additional refills. 30 tablet 0   diclofenac (VOLTAREN) 75 MG EC tablet Take 1 tablet by mouth twice daily 60 tablet 2   glipiZIDE (GLUCOTROL) 10 MG tablet Take 1 tablet (10 mg total) by mouth 2 (two) times daily before a meal. 180 tablet 0   glucose blood (BAYER CONTOUR NEXT TEST) test strip Use as instructed to test blood sugar once daily. E11.9 100 each 12   hydrochlorothiazide (HYDRODIURIL) 25 MG tablet Take 1 tablet (25 mg total) by mouth daily. 90 tablet 0   ibuprofen (ADVIL,MOTRIN) 200 MG tablet Take 200 mg by mouth every 6 (six) hours as needed. For pain     Insulin Pen Needle (B-D UF III MINI PEN NEEDLES) 31G X 5 MM MISC 1 each by Other route in the morning and at bedtime. 100 each 2   Insulin Pen Needle (BD PEN NEEDLE MICRO  U/F) 32G X 6 MM MISC Use as directed. 100 each 0   lisinopril (ZESTRIL) 10 MG tablet Take 3 tablets (30 mg total) by mouth daily. 270 tablet 0   Microlet Lancets MISC USE AS DIRECTED 1 ONCE DAILY TO TEST BLOOD SUGAR 100 each 0   omeprazole (PRILOSEC) 20 MG capsule Take 20 mg by mouth daily.     rosuvastatin (CRESTOR) 10 MG tablet Take 1 tablet (10 mg total) by mouth daily. 90 tablet 0   Semaglutide,0.25 or 0.'5MG'$ /DOS, (OZEMPIC, 0.25 OR 0.5 MG/DOSE,) 2 MG/1.5ML SOPN Inject 2 mg into the skin once a week. 18 mL 0   SEMGLEE, YFGN, 100 UNIT/ML Pen Inject 50 Units into the skin daily. 15 mL 0   vitamin E 400 UNIT capsule Take 800 Units by mouth at bedtime.     Current Facility-Administered Medications  Medication Dose Route Frequency Provider Last Rate Last Admin   lidocaine (PF) (XYLOCAINE) 1 % injection 2 mL  2 mL Intradermal Once Mecum, Erin E, PA-C         Allergies  Allergen Reactions   Benadryl [Diphenhydramine Hcl] Other (See Comments)    Keeps awake, jittery   Codeine Other (See Comments)    Keeps awake   Dilaudid [Hydromorphone Hcl] Itching and Nausea Only   Morphine And Related Itching and Nausea Only    Family History  Problem Relation Age of Onset   Diabetes Father    Breast cancer Neg Hx    Colon cancer Neg Hx    Esophageal cancer Neg Hx    Rectal cancer Neg Hx    Stomach cancer Neg Hx     Social History   Socioeconomic History   Marital status: Married    Spouse name: Not on file   Number of children: Not on file   Years of education: Not on file   Highest education level: Not on file  Occupational History   Not on file  Tobacco Use   Smoking status: Former    Packs/day: 0.50    Years: 4.00    Total pack years: 2.00    Types: Cigarettes    Quit date: 01/02/1988    Years since quitting: 33.9   Smokeless tobacco: Never  Vaping Use   Vaping Use: Never used  Substance and Sexual Activity   Alcohol use: Yes    Alcohol/week: 0.0 standard drinks of alcohol    Comment: Occasional Drink   Drug use: No   Sexual activity: Never  Other Topics Concern   Not on file  Social History Narrative   Not on file   Social Determinants of Health   Financial Resource Strain: Not on file  Food Insecurity: Not on file  Transportation Needs: Not on file  Physical Activity: Not on file  Stress: Not on file  Social Connections: Not on file  Intimate Partner Violence: Not on file     Constitutional: Denies fever, malaise, fatigue, headache or abrupt weight changes.  HEENT: Denies eye pain, eye redness, ear pain, ringing in the ears, wax buildup, runny nose, nasal congestion, bloody nose, or sore throat. Respiratory: Denies difficulty breathing, shortness of breath, cough or sputum production.   Cardiovascular: Denies chest pain, chest tightness, palpitations or swelling in the hands or feet.  Gastrointestinal:  Denies abdominal pain, bloating, constipation, diarrhea or blood in the stool.  GU: Denies urgency, frequency, pain with urination, burning sensation, blood in urine, odor or discharge. Musculoskeletal: Patient reports joint pain.  Denies decrease in range  of motion, difficulty with gait, muscle pain or joint swelling.  Skin: Patient reports rash to bilateral lower extremities.  Denies ulcercations.  Neurological: Denies dizziness, difficulty with memory, difficulty with speech or problems with balance and coordination.  Psych: Patient has a history of anxiety and depression.  Denies SI/HI.  No other specific complaints in a complete review of systems (except as listed in HPI above).  Objective:   Physical Exam  BP 106/62 (BP Location: Right Arm, Patient Position: Sitting, Cuff Size: Normal)   Pulse 80   Temp (!) 97.1 F (36.2 C) (Temporal)   Wt 182 lb (82.6 kg)   SpO2 98%   BMI 32.24 kg/m   Wt Readings from Last 3 Encounters:  06/21/21 209 lb 6.4 oz (95 kg)  04/05/21 212 lb (96.2 kg)  11/22/20 212 lb 6.4 oz (96.3 kg)    General: Appears her stated age, obese, in NAD. Skin: Warm, dry and intact.  Grouped, linear, vesicular lesions on erythematous base noted of bilateral lower extremities.  No ulcerations noted. HEENT: Head: normal shape and size; Eyes: sclera white, no icterus, conjunctiva pink, PERRLA and EOMs intact;  Cardiovascular: Normal rate and rhythm. S1,S2 noted.  No murmur, rubs or gallops noted. No JVD or BLE edema. No carotid bruits noted. Pulmonary/Chest: Normal effort and positive vesicular breath sounds. No respiratory distress. No wheezes, rales or ronchi noted.  Abdomen: Normal bowel sounds.  Pelvic: Cervix with report of changes.  No CMT.  Adnexa nonpalpable. Musculoskeletal: No difficulty with gait.  Neurological: Alert and oriented.  Psychiatric: Mood and affect normal. Behavior is normal. Judgment and thought content normal.    BMET    Component Value  Date/Time   NA 143 06/11/2021 1341   K 3.8 06/11/2021 1341   CL 103 06/11/2021 1341   CO2 25 06/11/2021 1341   GLUCOSE 68 (L) 06/11/2021 1341   GLUCOSE 127 (H) 11/16/2020 1259   BUN 29 (H) 06/11/2021 1341   CREATININE 1.19 (H) 06/11/2021 1341   CALCIUM 9.4 06/11/2021 1341   GFRNONAA >60 11/16/2020 1259   GFRAA 94 07/05/2019 1327    Lipid Panel     Component Value Date/Time   CHOL 131 06/11/2021 1341   TRIG 162 (H) 06/11/2021 1341   HDL 37 (L) 06/11/2021 1341   CHOLHDL 3.5 06/11/2021 1341   LDLCALC 66 06/11/2021 1341    CBC    Component Value Date/Time   WBC 5.0 06/11/2021 1341   WBC 4.8 11/16/2020 1259   RBC 4.70 06/11/2021 1341   RBC 4.84 11/16/2020 1259   HGB 13.7 06/11/2021 1341   HCT 40.7 06/11/2021 1341   PLT 176 06/11/2021 1341   MCV 87 06/11/2021 1341   MCH 29.1 06/11/2021 1341   MCH 29.5 11/16/2020 1259   MCHC 33.7 06/11/2021 1341   MCHC 35.0 11/16/2020 1259   RDW 12.7 06/11/2021 1341   LYMPHSABS 1.7 01/12/2017 1324   EOSABS 0.1 01/12/2017 1324   BASOSABS 0.0 01/12/2017 1324    Hgb A1C Lab Results  Component Value Date   HGBA1C 6.1 (H) 06/11/2021          Assessment & Plan:   Contact Dermatitis due to Plant:  80 mg Depo-Medrol IM Rx for Triamcinolone cream twice daily as needed  Screen for Cervical Cancer:  Pap smear today  RTC in 6 months for your annual exam Webb Silversmith, NP

## 2021-12-06 NOTE — Assessment & Plan Note (Signed)
Stable on citalopram Support offered

## 2021-12-06 NOTE — Assessment & Plan Note (Signed)
Continue albuterol as needed 

## 2021-12-10 ENCOUNTER — Other Ambulatory Visit: Payer: Self-pay | Admitting: Internal Medicine

## 2021-12-10 MED ORDER — CITALOPRAM HYDROBROMIDE 40 MG PO TABS: 40.0000 mg | ORAL_TABLET | Freq: Every day | ORAL | 1 refills | Status: DC

## 2021-12-11 LAB — PAP LB (LIQUID-BASED)

## 2021-12-12 ENCOUNTER — Encounter: Payer: Self-pay | Admitting: Internal Medicine

## 2021-12-12 ENCOUNTER — Other Ambulatory Visit: Payer: Self-pay | Admitting: Internal Medicine

## 2021-12-12 ENCOUNTER — Other Ambulatory Visit: Payer: Self-pay | Admitting: Family Medicine

## 2021-12-12 DIAGNOSIS — E1165 Type 2 diabetes mellitus with hyperglycemia: Secondary | ICD-10-CM

## 2021-12-12 MED ORDER — SEMGLEE (YFGN) 100 UNIT/ML ~~LOC~~ SOPN: 50.0000 [IU] | PEN_INJECTOR | Freq: Every day | SUBCUTANEOUS | 2 refills | Status: DC

## 2021-12-12 MED ORDER — LISINOPRIL 10 MG PO TABS: 30.0000 mg | ORAL_TABLET | Freq: Every day | ORAL | 0 refills | Status: DC

## 2021-12-12 NOTE — Telephone Encounter (Signed)
Please review for Regina. ? ?Thanks,  ? ?-Faheem Ziemann  ?

## 2021-12-13 NOTE — Telephone Encounter (Signed)
Refilled 12/12/2021 - confirmed by same pharmacy. Requested Prescriptions  Pending Prescriptions Disp Refills  . SEMGLEE, YFGN, 100 UNIT/ML Pen [Pharmacy Med Name: Semglee (yfgn) 100 UNIT/ML Subcutaneous Solution Pen-injector] 15 mL 0    Sig: INJECT 50 UNITS INTO THE SKIN  ONCE DAILY     Off-Protocol Failed - 12/12/2021  1:22 PM      Failed - Medication not assigned to a protocol, review manually.      Passed - Valid encounter within last 12 months    Recent Outpatient Visits          1 week ago Type 2 diabetes mellitus with hyperglycemia, without long-term current use of insulin [facility])   [facility] [place], [place] W, NP   5 months ago Urinary tract infection with hematuria, site unspecified   [place], PA-C   8 months ago Encounter for general adult medical examination with abnormal findings   [facility] [place], Coralie Keens, NP   1 year ago Hypokalemia   [facility] [place], Coralie Keens, NP   1 year ago [facility] [place], Coralie Keens, NP      Future Appointments            In 6 months Baity, Coralie Keens, NP [facility], [facility]

## 2021-12-16 ENCOUNTER — Encounter: Payer: Self-pay | Admitting: Internal Medicine

## 2021-12-18 LAB — HM DIABETES EYE EXAM

## 2021-12-26 DIAGNOSIS — E1165 Type 2 diabetes mellitus with hyperglycemia: Secondary | ICD-10-CM | POA: Diagnosis not present

## 2021-12-27 ENCOUNTER — Other Ambulatory Visit: Payer: Self-pay | Admitting: Internal Medicine

## 2021-12-27 LAB — LIPID PANEL
Chol/HDL Ratio: 3.3 ratio (ref 0.0–4.4)
Cholesterol, Total: 151 mg/dL (ref 100–199)
HDL: 46 mg/dL (ref >39)
LDL Chol Calc (NIH): 86 mg/dL (ref 0–99)
Triglycerides: 104 mg/dL (ref 0–149)
VLDL Cholesterol Cal: 19 mg/dL (ref 5–40)

## 2021-12-27 LAB — COMPREHENSIVE METABOLIC PANEL
ALT: 16 IU/L (ref 0–32)
AST: 14 IU/L (ref 0–40)
Albumin/Globulin Ratio: 2 (ref 1.2–2.2)
Albumin: 4.6 g/dL (ref 3.8–4.9)
Alkaline Phosphatase: 54 IU/L (ref 44–121)
BUN/Creatinine Ratio: 17 (ref 9–23)
BUN: 16 mg/dL (ref 6–24)
Bilirubin Total: 0.6 mg/dL (ref 0.0–1.2)
CO2: 24 mmol/L (ref 20–29)
Calcium: 9.9 mg/dL (ref 8.7–10.2)
Chloride: 98 mmol/L (ref 96–106)
Creatinine, Ser: 0.95 mg/dL (ref 0.57–1.00)
Globulin, Total: 2.3 g/dL (ref 1.5–4.5)
Glucose: 86 mg/dL (ref 70–99)
Potassium: 3.8 mmol/L (ref 3.5–5.2)
Sodium: 137 mmol/L (ref 134–144)
Total Protein: 6.9 g/dL (ref 6.0–8.5)
eGFR: 71 mL/min/{1.73_m2} (ref >59)

## 2021-12-27 MED ORDER — OZEMPIC (0.25 OR 0.5 MG/DOSE) 2 MG/1.5ML ~~LOC~~ SOPN: 2.0000 mg | PEN_INJECTOR | SUBCUTANEOUS | 0 refills | Status: DC

## 2021-12-31 ENCOUNTER — Encounter: Payer: Self-pay | Admitting: Internal Medicine

## 2022-01-01 ENCOUNTER — Encounter: Payer: Self-pay | Admitting: Internal Medicine

## 2022-01-07 ENCOUNTER — Other Ambulatory Visit: Payer: Self-pay | Admitting: Internal Medicine

## 2022-01-07 NOTE — Telephone Encounter (Signed)
Unable to refill per protocol, last refill by provider 12/27/21 for 18ML. Request is too soon, will refuse duplicate request.  Requested Prescriptions  Pending Prescriptions Disp Refills   OZEMPIC, 2 MG/DOSE, 8 MG/3ML SOPN [Pharmacy Med Name: Ozempic (2 MG/DOSE) 8 MG/3ML Subcutaneous Solution Pen-injector] 18 mL 0    Sig: INJECT '2MG'$  INTO THE SKIN ONCE A WEEK     Endocrinology:  Diabetes - GLP-1 Receptor Agonists - semaglutide Passed - 01/07/2022 11:09 AM      Passed - HBA1C in normal range and within 180 days    Hemoglobin A1C  Date Value Ref Range Status  10/17/2020 7.3  Final   HbA1c, POC (controlled diabetic range)  Date Value Ref Range Status  12/06/2021 5.7 0.0 - 7.0 % Final         Passed - Cr in normal range and within 360 days    Creatinine, Ser  Date Value Ref Range Status  12/26/2021 0.95 0.57 - 1.00 mg/dL Final         Passed - Valid encounter within last 6 months    Recent Outpatient Visits           1 month ago Type 2 diabetes mellitus with hyperglycemia, without long-term current use of insulin [facility])   [place], NP   6 months ago Urinary tract infection with hematuria, site unspecified   [place], PA-C   9 months ago Encounter for general adult medical examination with abnormal findings   [facility] [place], Coralie Keens, NP   1 year ago Hypokalemia   [facility] [place], Coralie Keens, NP   1 year ago [facility] [place], Coralie Keens, NP       Future Appointments             In 5 months Baity, Coralie Keens, NP [facility]

## 2022-01-08 ENCOUNTER — Encounter: Payer: Self-pay | Admitting: Internal Medicine

## 2022-01-09 MED ORDER — SEMAGLUTIDE (1 MG/DOSE) 4 MG/3ML ~~LOC~~ SOPN: 2.0000 mg | PEN_INJECTOR | SUBCUTANEOUS | 0 refills | Status: DC

## 2022-01-10 ENCOUNTER — Telehealth: Payer: Self-pay | Admitting: Internal Medicine

## 2022-01-10 MED ORDER — SEMAGLUTIDE (1 MG/DOSE) 4 MG/3ML ~~LOC~~ SOPN: 1.0000 mg | PEN_INJECTOR | SUBCUTANEOUS | 0 refills | Status: DC

## 2022-01-10 NOTE — Telephone Encounter (Signed)
I called pt's insurance company they will only cover Ozpempic '1mg'$  1 pen per 28 days despite the '2mg'$  being out of stock. The prescription needs to be rewritten as inject '1mg'$  per week.     Thanks,   -Mickel Baas

## 2022-01-10 NOTE — Telephone Encounter (Signed)
Ok, she will have to go down to 1 mg weekly. I have sent in updated RX.

## 2022-01-10 NOTE — Telephone Encounter (Signed)
Pt called in to follow up on PA for medication Ozempic. Pt stated she spoke with the pharmacy at [facility], and they ran it twice, and insurance is still waiting on prior authorization from the doctor.  Pt is frustrated stated she has been without her medication for 2 weeks.  Please advise.

## 2022-01-10 NOTE — Telephone Encounter (Signed)
See My Chart Message.   Thanks,   -Mickel Baas

## 2022-01-10 NOTE — Telephone Encounter (Signed)
I can do that or if we have a sample of the 2 mg, we could use this for the next month to see if they have a chance to get the 2 mg back in stock.

## 2022-01-10 NOTE — Telephone Encounter (Signed)
We don't have samples of '2mg'$ .  Only 0.25 or 0.'5mg'$ 

## 2022-01-10 NOTE — Addendum Note (Signed)
Addended by: Jearld Fenton on: 01/10/2022 12:54 PM   Modules accepted: Orders

## 2022-01-26 ENCOUNTER — Other Ambulatory Visit: Payer: Self-pay | Admitting: Internal Medicine

## 2022-01-26 DIAGNOSIS — I10 Essential (primary) hypertension: Secondary | ICD-10-CM

## 2022-01-27 ENCOUNTER — Other Ambulatory Visit: Payer: Self-pay | Admitting: Internal Medicine

## 2022-01-27 DIAGNOSIS — Z1231 Encounter for screening mammogram for malignant neoplasm of breast: Secondary | ICD-10-CM

## 2022-01-27 MED ORDER — HYDROCHLOROTHIAZIDE 25 MG PO TABS: 25.0000 mg | ORAL_TABLET | Freq: Every day | ORAL | 1 refills | Status: DC

## 2022-01-28 ENCOUNTER — Ambulatory Visit
Admission: RE | Admit: 2022-01-28 | Discharge: 2022-01-28 | Disposition: A | Discharge status: Home / Self Care | Payer: BC Managed Care – PPO | Source: Ambulatory Visit | Attending: Internal Medicine | Admitting: Internal Medicine

## 2022-01-28 DIAGNOSIS — Z1231 Encounter for screening mammogram for malignant neoplasm of breast: Secondary | ICD-10-CM | POA: Insufficient documentation

## 2022-02-17 ENCOUNTER — Other Ambulatory Visit: Payer: Self-pay | Admitting: Internal Medicine

## 2022-02-17 MED ORDER — BD PEN NEEDLE MICRO U/F 32G X 6 MM MISC: 0 refills | Status: DC

## 2022-03-06 ENCOUNTER — Other Ambulatory Visit: Payer: Self-pay | Admitting: Internal Medicine

## 2022-03-07 MED ORDER — ROSUVASTATIN CALCIUM 10 MG PO TABS: 10.0000 mg | ORAL_TABLET | Freq: Every day | ORAL | 0 refills | Status: DC

## 2022-03-12 ENCOUNTER — Other Ambulatory Visit: Payer: Self-pay | Admitting: Internal Medicine

## 2022-03-13 MED ORDER — LISINOPRIL 10 MG PO TABS: 30.0000 mg | ORAL_TABLET | Freq: Every day | ORAL | 0 refills | Status: DC

## 2022-03-14 DIAGNOSIS — M545 Low back pain, unspecified: Secondary | ICD-10-CM | POA: Diagnosis not present

## 2022-03-14 DIAGNOSIS — M5416 Radiculopathy, lumbar region: Secondary | ICD-10-CM | POA: Diagnosis not present

## 2022-03-23 ENCOUNTER — Encounter: Payer: Self-pay | Admitting: Internal Medicine

## 2022-03-24 MED ORDER — OZEMPIC (2 MG/DOSE) 8 MG/3ML ~~LOC~~ SOPN: 2.0000 mg | PEN_INJECTOR | SUBCUTANEOUS | 0 refills | Status: DC

## 2022-05-02 ENCOUNTER — Encounter: Payer: Self-pay | Admitting: Internal Medicine

## 2022-05-02 DIAGNOSIS — E1165 Type 2 diabetes mellitus with hyperglycemia: Secondary | ICD-10-CM

## 2022-05-05 MED ORDER — SEMGLEE (YFGN) 100 UNIT/ML ~~LOC~~ SOPN: 50.0000 [IU] | PEN_INJECTOR | Freq: Every day | SUBCUTANEOUS | 2 refills | Status: DC

## 2022-06-12 ENCOUNTER — Other Ambulatory Visit: Payer: Self-pay | Admitting: Internal Medicine

## 2022-06-13 ENCOUNTER — Encounter: Payer: Self-pay | Admitting: Internal Medicine

## 2022-06-13 ENCOUNTER — Telehealth: Payer: Self-pay | Admitting: Internal Medicine

## 2022-06-13 ENCOUNTER — Ambulatory Visit (INDEPENDENT_AMBULATORY_CARE_PROVIDER_SITE_OTHER): Payer: BC Managed Care – PPO | Admitting: Internal Medicine

## 2022-06-13 VITALS — BP 112/68 | HR 66 | Temp 96.4°F | Ht 62.0 in | Wt 168.0 lb

## 2022-06-13 DIAGNOSIS — M546 Pain in thoracic spine: Secondary | ICD-10-CM

## 2022-06-13 DIAGNOSIS — E119 Type 2 diabetes mellitus without complications: Secondary | ICD-10-CM

## 2022-06-13 DIAGNOSIS — E6609 Other obesity due to excess calories: Secondary | ICD-10-CM

## 2022-06-13 DIAGNOSIS — Z0001 Encounter for general adult medical examination with abnormal findings: Secondary | ICD-10-CM | POA: Diagnosis not present

## 2022-06-13 DIAGNOSIS — Z23 Encounter for immunization: Secondary | ICD-10-CM

## 2022-06-13 DIAGNOSIS — G8929 Other chronic pain: Secondary | ICD-10-CM

## 2022-06-13 DIAGNOSIS — Z6830 Body mass index (BMI) 30.0-30.9, adult: Secondary | ICD-10-CM

## 2022-06-13 MED ORDER — CITALOPRAM HYDROBROMIDE 40 MG PO TABS: 40.0000 mg | ORAL_TABLET | Freq: Every day | ORAL | 1 refills | Status: DC

## 2022-06-13 MED ORDER — LISINOPRIL 10 MG PO TABS: 30.0000 mg | ORAL_TABLET | Freq: Every day | ORAL | 1 refills | Status: DC

## 2022-06-13 MED ORDER — TIRZEPATIDE 7.5 MG/0.5ML ~~LOC~~ SOAJ: 7.5000 mg | SUBCUTANEOUS | 0 refills | Status: DC

## 2022-06-13 NOTE — Telephone Encounter (Signed)
Mounjaro sent to pharmacy.  See PA instructions below.

## 2022-06-13 NOTE — Telephone Encounter (Signed)
Sheri Hood called in from [facility] stating that the insurance would in fact cover the [facility] but they would need a prior authorization sent to them for the pharmacy to fill. This should be submitted electronically through [facility] which is accessible through the [facility] E portal. Please assist patient further with the prior authorization and send prescription to     [facility] [address], [place] - [address] Phone: [phone]  Fax: [phone]

## 2022-06-13 NOTE — Telephone Encounter (Signed)
Requested medication (s) are due for refill today:   Yes for both   Needs an appt for refills.  Requested medication (s) are on the active medication list:   Yes for both  Future visit scheduled:   Yes today Fri 4/12 at 1:20 with Rene Kocher   Last ordered: Celexa 12/10/2021 #90, 1 refill, appt needed for more refills;    Lisinopril 03/13/2022 #270, 0 refills  Returned because pt has an appt with Rene Kocher today at 1:20.   Requested Prescriptions  Pending Prescriptions Disp Refills   lisinopril (ZESTRIL) 10 MG tablet [Pharmacy Med Name: Lisinopril 10 MG Oral Tablet] 270 tablet 0    Sig: Take 3 tablets by mouth once daily     Cardiovascular:  ACE Inhibitors Failed - 06/12/2022 10:54 PM      Failed - Valid encounter within last 6 months    Recent Outpatient Visits           6 months ago Type 2 diabetes mellitus with hyperglycemia, without long-term current use of insulin [facility])   [facility] [place], [place] W, NP   11 months ago Urinary tract infection with hematuria, site unspecified   [facility]ec[place], Oswaldo Conroy, PA-C   1 year ago Encounter for general adult medical examination with abnormal findings   [facility] [place], Salvadore [place], NP   1 year ago Hypokalemia   [facility] [place], Salvadore Oxford, NP   1 year ago COVID-19   [facility] [place], Salvadore Oxford, NP       Future Appointments             Today Sampson Si, Salvadore Oxford, NP [place] [facility]            Passed - Cr in normal range and within 180 days    Creatinine, Ser  Date Value Ref Range Status  12/26/2021 0.95 0.57 - 1.00 mg/dL Final         Passed - K in normal range and within 180 days    Potassium  Date Value Ref Range Status  12/26/2021 3.8 3.5 - 5.2 mmol/L Final         Passed - Patient is not pregnant      Passed - Last BP in normal range    BP Readings from Last 1  Encounters:  12/06/21 106/62          citalopram (CELEXA) 40 MG tablet [Pharmacy Med Name: Citalopram Hydrobromide 40 MG Oral Tablet] 90 tablet 0    Sig: TAKE 1 TABLET BY MOUTH ONCE DAILY (SCHEDULE  AN  OFFICE  VISIT  FOR  ADDITIONAL  REFILLS)     Psychiatry:  Antidepressants - SSRI Failed - 06/12/2022 10:54 PM      Failed - Valid encounter within last 6 months    Recent Outpatient Visits           6 months ago Type 2 diabetes mellitus with hyperglycemia, without long-term current use of insulin [facility])   [place] [place], [place] W, NP   11 months ago Urinary tract infection with hematuria, site unspecified   [facility] Mecum, Oswaldo Conroy, PA-C   1 year ago Encounter for general adult medical examination with abnormal findings   [place] [facility] [place], Salvadore Oxford, NP   1 year ago  Hypokalemia   [place] [facility] [place], Salvadore Oxford, NP   1 year ago COVID-19   [facility] [place], Salvadore Oxford, NP       Future Appointments             Today Sampson Si, Salvadore Oxford, NP [place] [facility], [facility]            Passed - Completed PHQ-2 or PHQ-9 in the last 360 days

## 2022-06-13 NOTE — Progress Notes (Signed)
Subjective:    Patient ID: Sheri Hood, female    DOB: [birth year], 57 y.o.   MRN: [id number]  HPI  Patient presents to clinic today for annual exam.  Flu: 12/2021 Tetanus: 01/2014 COVID: never Pneumovax: 12/2021 Shingrix: 12/2021 Pap smear: 12/2021 Mammogram: 01/2022 Colon screening: 05/2018 Vision screening: annually Dentist: biannually  Diet: She does eat meat. She consumes fruits and veggies. She does eat some fried foods. She drinks mostly coffee, Dt. Coke, water Exercise: None  Review of Systems     Past Medical History:  Diagnosis Date   Allergy    Anemia    s/p ablation for heavy bleeding    Anxiety    Arthritis    arhtritis middle back, scolosis   Asthma    Depression    Diabetes mellitus without complication (HCC)    GERD (gastroesophageal reflux disease)    Headache(784.0)    h/x migraines   Heart murmur    recent diagnosed- / ehho 12/30/10 report on chart   Hiatal hernia    Hyperlipidemia    Hypertension    Recurrent upper respiratory infection (URI)    occ sore throat and slightly stuffy- no fever or cough   Sleep apnea    On CPAP   Sleep apnea     Current Outpatient Medications  Medication Sig Dispense Refill   albuterol (VENTOLIN HFA) 108 (90 Base) MCG/ACT inhaler Inhale 1-2 puffs into the lungs every 6 (six) hours as needed for wheezing or shortness of breath. 18 g 0   citalopram (CELEXA) 40 MG tablet Take 1 tablet (40 mg total) by mouth daily. Schedule an Office Visit for additional refills. 90 tablet 1   glucose blood (BAYER CONTOUR NEXT TEST) test strip Use as instructed to test blood sugar once daily. E11.9 100 each 12   hydrochlorothiazide (HYDRODIURIL) 25 MG tablet Take 1 tablet (25 mg total) by mouth daily. 90 tablet 1   ibuprofen (ADVIL,MOTRIN) 200 MG tablet Take 200 mg by mouth every 6 (six) hours as needed. For pain     Insulin Pen Needle (B-D UF III MINI PEN NEEDLES) 31G X 5 MM MISC 1 each by Other route in the morning and at  bedtime. 100 each 2   Insulin Pen Needle (BD PEN NEEDLE MICRO U/F) 32G X 6 MM MISC Use as directed. 100 each 0   lisinopril (ZESTRIL) 10 MG tablet Take 3 tablets (30 mg total) by mouth daily. 270 tablet 0   Microlet Lancets MISC USE AS DIRECTED 1 ONCE DAILY TO TEST BLOOD SUGAR 100 each 0   omeprazole (PRILOSEC) 20 MG capsule Take 20 mg by mouth daily.     OZEMPIC, 2 MG/DOSE, 8 MG/3ML SOPN Inject 2 mg into the skin once a week. 9 mL 0   rosuvastatin (CRESTOR) 10 MG tablet Take 1 tablet (10 mg total) by mouth daily. 90 tablet 0   SEMGLEE, YFGN, 100 UNIT/ML Pen Inject 50 Units into the skin daily. 15 mL 2   triamcinolone cream (KENALOG) 0.1 % Apply 1 Application topically 2 (two) times daily. 30 g 0   vitamin E 400 UNIT capsule Take 800 Units by mouth at bedtime.     Current Facility-Administered Medications  Medication Dose Route Frequency Provider Last Rate Last Admin   lidocaine (PF) (XYLOCAINE) 1 % injection 2 mL  2 mL Intradermal Once Mecum, Erin E, PA-C        Allergies  Allergen Reactions   Benadryl [Diphenhydramine Hcl] Other (See Comments)  Keeps awake, jittery   Codeine Other (See Comments)    Keeps awake   Dilaudid [Hydromorphone Hcl] Itching and Nausea Only   Morphine And Related Itching and Nausea Only    Family History  Problem Relation Age of Onset   Diabetes Father    Breast cancer Neg Hx    Colon cancer Neg Hx    Esophageal cancer Neg Hx    Rectal cancer Neg Hx    Stomach cancer Neg Hx     Social History   Socioeconomic History   Marital status: Married    Spouse name: Not on file   Number of children: Not on file   Years of education: Not on file   Highest education level: Not on file  Occupational History   Not on file  Tobacco Use   Smoking status: Former    Packs/day: 0.50    Years: 4.00    Additional pack years: 0.00    Total pack years: 2.00    Types: Cigarettes    Quit date: 01/02/1988    Years since quitting: 34.4   Smokeless tobacco:  Never  Vaping Use   Vaping Use: Never used  Substance and Sexual Activity   Alcohol use: Yes    Alcohol/week: 0.0 standard drinks of alcohol    Comment: Occasional Drink   Drug use: No   Sexual activity: Never  Other Topics Concern   Not on file  Social History Narrative   Not on file   Social Determinants of Health   Financial Resource Strain: Not on file  Food Insecurity: Not on file  Transportation Needs: Not on file  Physical Activity: Not on file  Stress: Not on file  Social Connections: Not on file  Intimate Partner Violence: Not on file     Constitutional: Denies fever, malaise, fatigue, headache or abrupt weight changes.  HEENT: Denies eye pain, eye redness, ear pain, ringing in the ears, wax buildup, runny nose, nasal congestion, bloody nose, or sore throat. Respiratory: Denies difficulty breathing, shortness of breath, cough or sputum production.   Cardiovascular: Denies chest pain, chest tightness, palpitations or swelling in the hands or feet.  Gastrointestinal: Patient reports intermittent constipation.  Denies abdominal pain, bloating,  diarrhea or blood in the stool.  GU: Denies urgency, frequency, pain with urination, burning sensation, blood in urine, odor or discharge. Musculoskeletal: Patient reports intermittent joint pain, chronic right side back pain.  Denies decrease in range of motion, difficulty with gait, muscle pain or joint swelling.  Skin: Denies redness, rashes, lesions or ulcercations.  Neurological: Denies dizziness, difficulty with memory, difficulty with speech or problems with balance and coordination.  Psych: Patient has a history of anxiety and depression.  Denies SI/HI.  No other specific complaints in a complete review of systems (except as listed in HPI above).  Objective:   Physical Exam   BP 112/68 (BP Location: Right Arm, Patient Position: Sitting, Cuff Size: Normal)   Pulse 66   Temp (!) 96.4 F (35.8 C) (Temporal)   Ht 5\' 2"   (1.575 m)   Wt 168 lb (76.2 kg)   SpO2 99%   BMI 30.73 kg/m   Wt Readings from Last 3 Encounters:  12/06/21 182 lb (82.6 kg)  06/21/21 209 lb 6.4 oz (95 kg)  04/05/21 212 lb (96.2 kg)    General: Appears her stated age, obese, in NAD. Skin: Warm, dry and intact. No ulcerations noted. HEENT: Head: normal shape and size; Eyes: sclera white, no icterus, conjunctiva  pink, PERRLA and EOMs intact;  Cardiovascular: Normal rate and rhythm. S1,S2 noted.  No murmur, rubs or gallops noted. No JVD or BLE edema. No carotid bruits noted. Pulmonary/Chest: Normal effort and positive vesicular breath sounds. No respiratory distress. No wheezes, rales or ronchi noted.  Abdomen:  Normal bowel sounds. Musculoskeletal: No bony tenderness noted over the spine.  She is kyphotic.  She has pain with palpation in the right subscapular region.  Strength 5/5 BUE/BLE.  No difficulty with gait.  Neurological: Alert and oriented. Cranial nerves II-XII grossly intact. Coordination normal.  Psychiatric: Mood and affect normal. Behavior is normal. Judgment and thought content normal.    BMET    Component Value Date/Time   NA 137 12/26/2021 0944   K 3.8 12/26/2021 0944   CL 98 12/26/2021 0944   CO2 24 12/26/2021 0944   GLUCOSE 86 12/26/2021 0944   GLUCOSE 127 (H) 11/16/2020 1259   BUN 16 12/26/2021 0944   CREATININE 0.95 12/26/2021 0944   CALCIUM 9.9 12/26/2021 0944   GFRNONAA >60 11/16/2020 1259   GFRAA 94 07/05/2019 1327    Lipid Panel     Component Value Date/Time   CHOL 151 12/26/2021 0944   TRIG 104 12/26/2021 0944   HDL 46 12/26/2021 0944   CHOLHDL 3.3 12/26/2021 0944   LDLCALC 86 12/26/2021 0944    CBC    Component Value Date/Time   WBC 5.0 06/11/2021 1341   WBC 4.8 11/16/2020 1259   RBC 4.70 06/11/2021 1341   RBC 4.84 11/16/2020 1259   HGB 13.7 06/11/2021 1341   HCT 40.7 06/11/2021 1341   PLT 176 06/11/2021 1341   MCV 87 06/11/2021 1341   MCH 29.1 06/11/2021 1341   MCH 29.5  11/16/2020 1259   MCHC 33.7 06/11/2021 1341   MCHC 35.0 11/16/2020 1259   RDW 12.7 06/11/2021 1341   LYMPHSABS 1.7 01/12/2017 1324   EOSABS 0.1 01/12/2017 1324   BASOSABS 0.0 01/12/2017 1324    Hgb A1C Lab Results  Component Value Date   HGBA1C 5.7 12/06/2021           Assessment & Plan:   Preventative Health Maintenance:  Encouraged her to get a flu shot in the fall Tetanus UTD Encouraged her to get her COVID-vaccine Pneumovax UTD Will give her her second Shingrix today Pap smear UTD Mammogram UTD Colon screening UTD Encouraged her to consume a balanced diet and exercise regimen Advised her to see an eye doctor and dentist annually We will check CBC, c-Met, lipid, A1c and urine microalbumin today  Right Side Back Pain:  Will refill Tramadol Rx for Methocarbamol 500 mg every 8 hours as needed-sedation caution given Encourage yoga or stretching Advised her to follow-up with [facility] in 6 months, follow-up chronic conditions Nicki Reaper, NP

## 2022-06-13 NOTE — Addendum Note (Signed)
Addended by: Lorre Munroe on: 06/13/2022 02:48 PM   Modules accepted: Orders

## 2022-06-13 NOTE — Patient Instructions (Signed)
Health Maintenance for Postmenopausal Women Menopause is a normal process in which your ability to get pregnant comes to an end. This process happens slowly over many months or years, usually between the ages of 48 and 55. Menopause is complete when you have missed your menstrual period for 12 months. It is important to talk with your health care provider about some of the most common conditions that affect women after menopause (postmenopausal women). These include heart disease, cancer, and bone loss (osteoporosis). Adopting a healthy lifestyle and getting preventive care can help to promote your health and wellness. The actions you take can also lower your chances of developing some of these common conditions. What are the signs and symptoms of menopause? During menopause, you may have the following symptoms: Hot flashes. These can be moderate or severe. Night sweats. Decrease in sex drive. Mood swings. Headaches. Tiredness (fatigue). Irritability. Memory problems. Problems falling asleep or staying asleep. Talk with your health care provider about treatment options for your symptoms. Do I need hormone replacement therapy? Hormone replacement therapy is effective in treating symptoms that are caused by menopause, such as hot flashes and night sweats. Hormone replacement carries certain risks, especially as you become older. If you are thinking about using estrogen or estrogen with progestin, discuss the benefits and risks with your health care provider. How can I reduce my risk for heart disease and stroke? The risk of heart disease, heart attack, and stroke increases as you age. One of the causes may be a change in the body's hormones during menopause. This can affect how your body uses dietary fats, triglycerides, and cholesterol. Heart attack and stroke are medical emergencies. There are many things that you can do to help prevent heart disease and stroke. Watch your blood pressure High  blood pressure causes heart disease and increases the risk of stroke. This is more likely to develop in people who have high blood pressure readings or are overweight. Have your blood pressure checked: Every 3-5 years if you are 18-39 years of age. Every year if you are 40 years old or older. Eat a healthy diet  Eat a diet that includes plenty of vegetables, fruits, low-fat dairy products, and lean protein. Do not eat a lot of foods that are high in solid fats, added sugars, or sodium. Get regular exercise Get regular exercise. This is one of the most important things you can do for your health. Most adults should: Try to exercise for at least 150 minutes each week. The exercise should increase your heart rate and make you sweat (moderate-intensity exercise). Try to do strengthening exercises at least twice each week. Do these in addition to the moderate-intensity exercise. Spend less time sitting. Even light physical activity can be beneficial. Other tips Work with your health care provider to achieve or maintain a healthy weight. Do not use any products that contain nicotine or tobacco. These products include cigarettes, chewing tobacco, and vaping devices, such as e-cigarettes. If you need help quitting, ask your health care provider. Know your numbers. Ask your health care provider to check your cholesterol and your blood sugar (glucose). Continue to have your blood tested as directed by your health care provider. Do I need screening for cancer? Depending on your health history and family history, you may need to have cancer screenings at different stages of your life. This may include screening for: Breast cancer. Cervical cancer. Lung cancer. Colorectal cancer. What is my risk for osteoporosis? After menopause, you may be   at increased risk for osteoporosis. Osteoporosis is a condition in which bone destruction happens more quickly than new bone creation. To help prevent osteoporosis or  the bone fractures that can happen because of osteoporosis, you may take the following actions: If you are 19-50 years old, get at least 1,000 mg of calcium and at least 600 international units (IU) of vitamin D per day. If you are older than age 50 but younger than age 70, get at least 1,200 mg of calcium and at least 600 international units (IU) of vitamin D per day. If you are older than age 70, get at least 1,200 mg of calcium and at least 800 international units (IU) of vitamin D per day. Smoking and drinking excessive alcohol increase the risk of osteoporosis. Eat foods that are rich in calcium and vitamin D, and do weight-bearing exercises several times each week as directed by your health care provider. How does menopause affect my mental health? Depression may occur at any age, but it is more common as you become older. Common symptoms of depression include: Feeling depressed. Changes in sleep patterns. Changes in appetite or eating patterns. Feeling an overall lack of motivation or enjoyment of activities that you previously enjoyed. Frequent crying spells. Talk with your health care provider if you think that you are experiencing any of these symptoms. General instructions See your health care provider for regular wellness exams and vaccines. This may include: Scheduling regular health, dental, and eye exams. Getting and maintaining your vaccines. These include: Influenza vaccine. Get this vaccine each year before the flu season begins. Pneumonia vaccine. Shingles vaccine. Tetanus, diphtheria, and pertussis (Tdap) booster vaccine. Your health care provider may also recommend other immunizations. Tell your health care provider if you have ever been abused or do not feel safe at home. Summary Menopause is a normal process in which your ability to get pregnant comes to an end. This condition causes hot flashes, night sweats, decreased interest in sex, mood swings, headaches, or lack  of sleep. Treatment for this condition may include hormone replacement therapy. Take actions to keep yourself healthy, including exercising regularly, eating a healthy diet, watching your weight, and checking your blood pressure and blood sugar levels. Get screened for cancer and depression. Make sure that you are up to date with all your vaccines. This information is not intended to replace advice given to you by your health care provider. Make sure you discuss any questions you have with your health care provider. Document Revised: 07/09/2020 Document Reviewed: 07/09/2020 Elsevier Patient Education  2023 [employer]  

## 2022-06-13 NOTE — Assessment & Plan Note (Signed)
Encourage diet and exercise for weight loss 

## 2022-06-13 NOTE — Telephone Encounter (Signed)
See telephone encounter   Thanks,   -Tyce Delcid  

## 2022-06-16 MED ORDER — METHOCARBAMOL 500 MG PO TABS: 500.0000 mg | ORAL_TABLET | Freq: Three times a day (TID) | ORAL | 0 refills | Status: DC | PRN

## 2022-06-16 MED ORDER — TRAMADOL HCL 50 MG PO TABS: 50.0000 mg | ORAL_TABLET | Freq: Four times a day (QID) | ORAL | 0 refills | Status: DC | PRN

## 2022-06-30 DIAGNOSIS — E119 Type 2 diabetes mellitus without complications: Secondary | ICD-10-CM | POA: Diagnosis not present

## 2022-06-30 DIAGNOSIS — Z0001 Encounter for general adult medical examination with abnormal findings: Secondary | ICD-10-CM | POA: Diagnosis not present

## 2022-07-01 ENCOUNTER — Encounter: Payer: Self-pay | Admitting: Internal Medicine

## 2022-07-01 LAB — HEMOGLOBIN A1C
Est. average glucose Bld gHb Est-mCnc: 111 mg/dL
Hgb A1c MFr Bld: 5.5 % (ref 4.8–5.6)

## 2022-07-01 LAB — COMPREHENSIVE METABOLIC PANEL
ALT: 13 IU/L (ref 0–32)
AST: 16 IU/L (ref 0–40)
Albumin/Globulin Ratio: 2.3 — ABNORMAL HIGH (ref 1.2–2.2)
Albumin: 4.3 g/dL (ref 3.8–4.9)
Alkaline Phosphatase: 55 IU/L (ref 44–121)
BUN/Creatinine Ratio: 20 (ref 9–23)
BUN: 21 mg/dL (ref 6–24)
Bilirubin Total: 0.3 mg/dL (ref 0.0–1.2)
CO2: 24 mmol/L (ref 20–29)
Calcium: 9.4 mg/dL (ref 8.7–10.2)
Chloride: 98 mmol/L (ref 96–106)
Creatinine, Ser: 1.05 mg/dL — ABNORMAL HIGH (ref 0.57–1.00)
Globulin, Total: 1.9 g/dL (ref 1.5–4.5)
Glucose: 73 mg/dL (ref 70–99)
Potassium: 3.2 mmol/L — ABNORMAL LOW (ref 3.5–5.2)
Sodium: 138 mmol/L (ref 134–144)
Total Protein: 6.2 g/dL (ref 6.0–8.5)
eGFR: 63 mL/min/{1.73_m2} (ref >59)

## 2022-07-01 LAB — CBC
Hematocrit: 39.9 % (ref 34.0–46.6)
Hemoglobin: 13 g/dL (ref 11.1–15.9)
MCH: 28.3 pg (ref 26.6–33.0)
MCHC: 32.6 g/dL (ref 31.5–35.7)
MCV: 87 fL (ref 79–97)
Platelets: 194 10*3/uL (ref 150–450)
RBC: 4.59 x10E6/uL (ref 3.77–5.28)
RDW: 12.8 % (ref 11.7–15.4)
WBC: 4.1 10*3/uL (ref 3.4–10.8)

## 2022-07-01 LAB — MICROALBUMIN / CREATININE URINE RATIO
Creatinine, Urine: 147.8 mg/dL
Microalb/Creat Ratio: 23 mg/g creat (ref 0–29)
Microalbumin, Urine: 33.9 ug/mL

## 2022-07-01 LAB — LIPID PANEL
Chol/HDL Ratio: 2.9 ratio (ref 0.0–4.4)
Cholesterol, Total: 138 mg/dL (ref 100–199)
HDL: 47 mg/dL (ref >39)
LDL Chol Calc (NIH): 74 mg/dL (ref 0–99)
Triglycerides: 89 mg/dL (ref 0–149)
VLDL Cholesterol Cal: 17 mg/dL (ref 5–40)

## 2022-07-01 MED ORDER — POTASSIUM CHLORIDE CRYS ER 10 MEQ PO TBCR: 10.0000 meq | EXTENDED_RELEASE_TABLET | Freq: Every day | ORAL | 1 refills | Status: DC

## 2022-07-10 ENCOUNTER — Encounter: Payer: Self-pay | Admitting: Internal Medicine

## 2022-07-14 MED ORDER — TIRZEPATIDE 10 MG/0.5ML ~~LOC~~ SOAJ: 10.0000 mg | SUBCUTANEOUS | 0 refills | Status: DC

## 2022-07-21 DIAGNOSIS — R293 Abnormal posture: Secondary | ICD-10-CM | POA: Diagnosis not present

## 2022-07-21 DIAGNOSIS — M5459 Other low back pain: Secondary | ICD-10-CM | POA: Diagnosis not present

## 2022-07-21 DIAGNOSIS — M5416 Radiculopathy, lumbar region: Secondary | ICD-10-CM | POA: Diagnosis not present

## 2022-08-11 ENCOUNTER — Other Ambulatory Visit: Payer: Self-pay | Admitting: Internal Medicine

## 2022-08-11 MED ORDER — METHOCARBAMOL 500 MG PO TABS: 500.0000 mg | ORAL_TABLET | Freq: Three times a day (TID) | ORAL | 0 refills | Status: DC | PRN

## 2022-08-25 DIAGNOSIS — R293 Abnormal posture: Secondary | ICD-10-CM | POA: Diagnosis not present

## 2022-08-25 DIAGNOSIS — M5416 Radiculopathy, lumbar region: Secondary | ICD-10-CM | POA: Diagnosis not present

## 2022-08-25 DIAGNOSIS — M5459 Other low back pain: Secondary | ICD-10-CM | POA: Diagnosis not present

## 2022-09-08 DIAGNOSIS — R293 Abnormal posture: Secondary | ICD-10-CM | POA: Diagnosis not present

## 2022-09-08 DIAGNOSIS — M5416 Radiculopathy, lumbar region: Secondary | ICD-10-CM | POA: Diagnosis not present

## 2022-10-02 ENCOUNTER — Encounter: Payer: Self-pay | Admitting: Internal Medicine

## 2022-10-02 MED ORDER — TIRZEPATIDE 10 MG/0.5ML ~~LOC~~ SOAJ: 10.0000 mg | SUBCUTANEOUS | 0 refills | Status: DC

## 2022-10-30 ENCOUNTER — Encounter: Payer: Self-pay | Admitting: Internal Medicine

## 2022-10-30 DIAGNOSIS — I10 Essential (primary) hypertension: Secondary | ICD-10-CM

## 2022-10-30 DIAGNOSIS — Z0279 Encounter for issue of other medical certificate: Secondary | ICD-10-CM

## 2022-10-30 MED ORDER — HYDROCHLOROTHIAZIDE 25 MG PO TABS: 25.0000 mg | ORAL_TABLET | Freq: Every day | ORAL | 1 refills | Status: DC

## 2022-11-04 ENCOUNTER — Encounter: Payer: Self-pay | Admitting: Internal Medicine

## 2022-11-24 ENCOUNTER — Encounter: Payer: Self-pay | Admitting: Internal Medicine

## 2022-11-24 DIAGNOSIS — E1165 Type 2 diabetes mellitus with hyperglycemia: Secondary | ICD-10-CM

## 2022-11-24 MED ORDER — SEMGLEE (YFGN) 100 UNIT/ML ~~LOC~~ SOPN: 50.0000 [IU] | PEN_INJECTOR | Freq: Every day | SUBCUTANEOUS | 0 refills | Status: DC

## 2022-12-08 ENCOUNTER — Encounter: Payer: Self-pay | Admitting: Internal Medicine

## 2022-12-09 MED ORDER — LISINOPRIL 10 MG PO TABS: 30.0000 mg | ORAL_TABLET | Freq: Every day | ORAL | 1 refills | Status: DC

## 2022-12-19 ENCOUNTER — Encounter: Payer: Self-pay | Admitting: Internal Medicine

## 2022-12-19 ENCOUNTER — Ambulatory Visit: Payer: BC Managed Care – PPO | Admitting: Internal Medicine

## 2022-12-19 VITALS — BP 110/70 | HR 67 | Ht 62.0 in | Wt 145.0 lb

## 2022-12-19 DIAGNOSIS — K219 Gastro-esophageal reflux disease without esophagitis: Secondary | ICD-10-CM

## 2022-12-19 DIAGNOSIS — E663 Overweight: Secondary | ICD-10-CM | POA: Diagnosis not present

## 2022-12-19 DIAGNOSIS — E119 Type 2 diabetes mellitus without complications: Secondary | ICD-10-CM

## 2022-12-19 DIAGNOSIS — I10 Essential (primary) hypertension: Secondary | ICD-10-CM | POA: Diagnosis not present

## 2022-12-19 DIAGNOSIS — M1990 Unspecified osteoarthritis, unspecified site: Secondary | ICD-10-CM

## 2022-12-19 DIAGNOSIS — F32A Depression, unspecified: Secondary | ICD-10-CM

## 2022-12-19 DIAGNOSIS — Z6826 Body mass index (BMI) 26.0-26.9, adult: Secondary | ICD-10-CM

## 2022-12-19 DIAGNOSIS — G4733 Obstructive sleep apnea (adult) (pediatric): Secondary | ICD-10-CM

## 2022-12-19 DIAGNOSIS — E1169 Type 2 diabetes mellitus with other specified complication: Secondary | ICD-10-CM

## 2022-12-19 DIAGNOSIS — M25551 Pain in right hip: Secondary | ICD-10-CM

## 2022-12-19 DIAGNOSIS — E785 Hyperlipidemia, unspecified: Secondary | ICD-10-CM

## 2022-12-19 DIAGNOSIS — G8929 Other chronic pain: Secondary | ICD-10-CM

## 2022-12-19 DIAGNOSIS — J4520 Mild intermittent asthma, uncomplicated: Secondary | ICD-10-CM

## 2022-12-19 DIAGNOSIS — F419 Anxiety disorder, unspecified: Secondary | ICD-10-CM

## 2022-12-19 LAB — POCT GLYCOSYLATED HEMOGLOBIN (HGB A1C): Hemoglobin A1C: 5 % (ref 4.0–5.6)

## 2022-12-19 MED ORDER — MELOXICAM 15 MG PO TABS: 15.0000 mg | ORAL_TABLET | Freq: Every day | ORAL | 0 refills | Status: DC

## 2022-12-19 MED ORDER — CITALOPRAM HYDROBROMIDE 40 MG PO TABS: 40.0000 mg | ORAL_TABLET | Freq: Every day | ORAL | 1 refills | Status: DC

## 2022-12-19 NOTE — Assessment & Plan Note (Signed)
Continue albuterol as needed 

## 2022-12-19 NOTE — Assessment & Plan Note (Signed)
Encouraged diet and exercise for weight loss ?

## 2022-12-19 NOTE — Assessment & Plan Note (Signed)
C-Met and lipid profile today Encouraged her to consume a low-fat diet Not taking rosuvastatin as prescribed

## 2022-12-19 NOTE — Assessment & Plan Note (Signed)
Will start meloxicam 15 mg daily Okay to continue Tylenol OTC and methocarbamol as needed

## 2022-12-19 NOTE — Patient Instructions (Signed)

## 2022-12-19 NOTE — Assessment & Plan Note (Signed)
Avoid foods that trigger reflux Weight loss as this can help reduce reflux symptoms Continue omeprazole

## 2022-12-19 NOTE — Assessment & Plan Note (Signed)
Not wearing CPAP due to extensive weight loss

## 2022-12-19 NOTE — Assessment & Plan Note (Signed)
Controlled on lisinopril and hydrochlorothiazide Will discontinue HCTZ and potassium at this time DASH diet and exercise for weight loss C-Met today

## 2022-12-19 NOTE — Progress Notes (Signed)
Subjective:    Patient ID: Sheri Hood, female    DOB: 06-Aug-1965, 57 y.o.   MRN: [id number]  HPI  Patient presents to clinic today for 29-month follow-up of chronic conditions.  Anxiety and depression: Chronic, managed on citalopram.  She is not currently seeing a therapist.  She denies SI/HI.  Asthma: She denies chronic cough or shortness of breath.  She uses albuterol only as needed.  There are no PFTs on file.  She does not follow with pulmonology.  OA: Generalized. She reports worsening hip pain, recently started PT. She takes tylenol and methocarbamol as needed with good relief of symptoms.  She does not follow with orthopedics.  HTN: Her BP today is 110/70.  She is taking lisinopril and HCTZ as prescribed.  ECG from 11/2020 reviewed.  GERD: Triggered by "everything she eats".  She denies breakthrough on omeprazole.  There is no upper GI on file.  DM2: Her last A1c was 5.5%, 06/2022.  She is taking semglee and mounjaro as prescribed.  She does not check her sugars.  She checks her feet routinely.  Her last eye exam was 12/2021.  Flu 12/2021.  Pneumovax 12/2021.  COVID never.  She follows with endocrinology.  OSA: She averages 7 hours of sleep per night without the use of her CPAP.  There is no sleep study on file.  HLD: Her last LDL was 74, triglycerides 89, 06/2022.  She is not taking rosuvastatin as prescribed.  She does not consume a low-fat diet.  Review of Systems     Past Medical History:  Diagnosis Date   Allergy    Anemia    s/p ablation for heavy bleeding    Anxiety    Arthritis    arhtritis middle back, scolosis   Asthma    Depression    Diabetes mellitus without complication (HCC)    GERD (gastroesophageal reflux disease)    Headache(784.0)    h/x migraines   Heart murmur    recent diagnosed- / ehho 12/30/10 report on chart   Hiatal hernia    Hyperlipidemia    Hypertension    Recurrent upper respiratory infection (URI)    occ sore throat and slightly  stuffy- no fever or cough   Sleep apnea    On CPAP   Sleep apnea     Current Outpatient Medications  Medication Sig Dispense Refill   albuterol (VENTOLIN HFA) 108 (90 Base) MCG/ACT inhaler Inhale 1-2 puffs into the lungs every 6 (six) hours as needed for wheezing or shortness of breath. 18 g 0   citalopram (CELEXA) 40 MG tablet Take 1 tablet (40 mg total) by mouth daily. Schedule an Office Visit for additional refills. 90 tablet 1   glucose blood (BAYER CONTOUR NEXT TEST) test strip Use as instructed to test blood sugar once daily. E11.9 100 each 12   hydrochlorothiazide (HYDRODIURIL) 25 MG tablet Take 1 tablet (25 mg total) by mouth daily. 90 tablet 1   ibuprofen (ADVIL,MOTRIN) 200 MG tablet Take 200 mg by mouth every 6 (six) hours as needed. For pain     Insulin Pen Needle (B-D UF III MINI PEN NEEDLES) 31G X 5 MM MISC 1 each by Other route in the morning and at bedtime. 100 each 2   Insulin Pen Needle (BD PEN NEEDLE MICRO U/F) 32G X 6 MM MISC Use as directed. 100 each 0   lisinopril (ZESTRIL) 10 MG tablet Take 3 tablets (30 mg total) by mouth daily. 270 tablet 1  methocarbamol (ROBAXIN) 500 MG tablet Take 1 tablet (500 mg total) by mouth every 8 (eight) hours as needed for muscle spasms. 20 tablet 0   Microlet Lancets MISC USE AS DIRECTED 1 ONCE DAILY TO TEST BLOOD SUGAR 100 each 0   omeprazole (PRILOSEC) 20 MG capsule Take 20 mg by mouth daily.     potassium chloride (KLOR-CON M) 10 MEQ tablet Take 1 tablet (10 mEq total) by mouth daily. 90 tablet 1   rosuvastatin (CRESTOR) 10 MG tablet Take 1 tablet (10 mg total) by mouth daily. 90 tablet 0   SEMGLEE, YFGN, 100 UNIT/ML Pen Inject 50 Units into the skin daily. 15 mL 0   tirzepatide (MOUNJARO) 10 MG/0.5ML Pen Inject 10 mg into the skin once a week. 6 mL 0   vitamin E 400 UNIT capsule Take 800 Units by mouth at bedtime.     Current Facility-Administered Medications  Medication Dose Route Frequency Provider Last Rate Last Admin    lidocaine (PF) (XYLOCAINE) 1 % injection 2 mL  2 mL Intradermal Once Mecum, Erin E, PA-C        Allergies  Allergen Reactions   Benadryl [Diphenhydramine Hcl] Other (See Comments)    Keeps awake, jittery   Codeine Other (See Comments)    Keeps awake   Dilaudid [Hydromorphone Hcl] Itching and Nausea Only   Morphine And Codeine Itching and Nausea Only    Family History  Problem Relation Age of Onset   Hypertension Mother    Glaucoma Mother    Heart disease Father    Obesity Sister    Diabetes Paternal Grandmother    Breast cancer Neg Hx    Colon cancer Neg Hx    Esophageal cancer Neg Hx    Rectal cancer Neg Hx    Stomach cancer Neg Hx     Social History   Socioeconomic History   Marital status: Married    Spouse name: Not on file   Number of children: Not on file   Years of education: Not on file   Highest education level: 12th grade  Occupational History   Not on file  Tobacco Use   Smoking status: Former    Current packs/day: 0.00    Average packs/day: 0.5 packs/day for 4.0 years (2.0 ttl pk-yrs)    Types: Cigarettes    Start date: 01/02/1984    Quit date: 01/02/1988    Years since quitting: 34.9   Smokeless tobacco: Never  Vaping Use   Vaping status: Never Used  Substance and Sexual Activity   Alcohol use: Yes    Alcohol/week: 0.0 standard drinks of alcohol    Comment: Occasional Drink   Drug use: No   Sexual activity: Never  Other Topics Concern   Not on file  Social History Narrative   Not on file   Social Determinants of Health   Financial Resource Strain: Low Risk  (12/18/2022)   Overall Financial Resource Strain (CARDIA)    Difficulty of Paying Living Expenses: Not very hard  Food Insecurity: No Food Insecurity (12/18/2022)   Hunger Vital Sign    Worried About Running Out of Food in the Last Year: Never true    Ran Out of Food in the Last Year: Never true  Transportation Needs: No Transportation Needs (12/18/2022)   PRAPARE - Therapist, art (Medical): No    Lack of Transportation (Non-Medical): No  Physical Activity: Unknown (12/18/2022)   Exercise Vital Sign    Days of  Exercise per Week: 0 days    Minutes of Exercise per Session: Not on file  Stress: No Stress Concern Present (12/18/2022)   [employer] of Occupational Health - Occupational Stress Questionnaire    Feeling of Stress : Only a little  Social Connections: Moderately Integrated (12/18/2022)   Social Connection and Isolation Panel [NHANES]    Frequency of Communication with Friends and Family: More than three times a week    Frequency of Social Gatherings with Friends and Family: Twice a week    Attends Religious Services: More than 4 times per year    Active Member of [employer] or Organizations: No    Attends Engineer, structural: Not on file    Marital Status: Married  Catering manager Violence: Not on file     Constitutional: Denies fever, malaise, fatigue, headache or abrupt weight changes.  HEENT: Denies eye pain, eye redness, ear pain, ringing in the ears, wax buildup, runny nose, nasal congestion, bloody nose, or sore throat. Respiratory: Denies difficulty breathing, shortness of breath, cough or sputum production.   Cardiovascular: Denies chest pain, chest tightness, palpitations or swelling in the hands or feet.  Gastrointestinal: Denies abdominal pain, bloating, constipation, diarrhea or blood in the stool.  GU: Denies urgency, frequency, pain with urination, burning sensation, blood in urine, odor or discharge. Musculoskeletal: Patient reports joint pain, worsening right hip pain.  Denies decrease in range of motion, difficulty with gait, muscle pain or joint swelling.  Skin: Denies redness, rashes, lesions or ulcercations.  Neurological: Denies dizziness, difficulty with memory, difficulty with speech or problems with balance and coordination.  Psych: Patient has a history of anxiety and depression.  Denies  SI/HI.  No other specific complaints in a complete review of systems (except as listed in HPI above).  Objective:   Physical Exam  BP 110/70   Pulse 67   Ht 5\' 2"  (1.575 m)   Wt 145 lb (65.8 kg)   SpO2 99%   BMI 26.52 kg/m   Wt Readings from Last 3 Encounters:  06/13/22 168 lb (76.2 kg)  12/06/21 182 lb (82.6 kg)  06/21/21 209 lb 6.4 oz (95 kg)    General: Appears her stated age,overweight, in NAD. Skin: Warm, dry and intact. No ulcerations noted. HEENT: Head: normal shape and size; Eyes: sclera white, no icterus, conjunctiva pink, PERRLA and EOMs intact;  Cardiovascular: Normal rate and rhythm. S1,S2 noted.  No murmur, rubs or gallops noted. No JVD or BLE edema. No carotid bruits noted. Pulmonary/Chest: Normal effort and positive vesicular breath sounds. No respiratory distress. No wheezes, rales or ronchi noted.  Abdomen: Soft and nontender.  Musculoskeletal: Normal abduction, adduction, internal and external rotation of the right hip.  Pain with palpation over the right anterior and posterior iliac crest.  Strength 5/5 BLE.  No difficulty with gait.  Neurological: Alert and oriented. Coordination normal.  Psychiatric: Mood and affect normal. Behavior is normal. Judgment and thought content normal.    BMET    Component Value Date/Time   NA 138 06/30/2022 0732   K 3.2 (L) 06/30/2022 0732   CL 98 06/30/2022 0732   CO2 24 06/30/2022 0732   GLUCOSE 73 06/30/2022 0732   GLUCOSE 127 (H) 11/16/2020 1259   BUN 21 06/30/2022 0732   CREATININE 1.05 (H) 06/30/2022 0732   CALCIUM 9.4 06/30/2022 0732   GFRNONAA >60 11/16/2020 1259   GFRAA 94 07/05/2019 1327    Lipid Panel     Component Value Date/Time  CHOL 138 06/30/2022 0732   TRIG 89 06/30/2022 0732   HDL 47 06/30/2022 0732   CHOLHDL 2.9 06/30/2022 0732   LDLCALC 74 06/30/2022 0732    CBC    Component Value Date/Time   WBC 4.1 06/30/2022 0732   WBC 4.8 11/16/2020 1259   RBC 4.59 06/30/2022 0732   RBC 4.84  11/16/2020 1259   HGB 13.0 06/30/2022 0732   HCT 39.9 06/30/2022 0732   PLT 194 06/30/2022 0732   MCV 87 06/30/2022 0732   MCH 28.3 06/30/2022 0732   MCH 29.5 11/16/2020 1259   MCHC 32.6 06/30/2022 0732   MCHC 35.0 11/16/2020 1259   RDW 12.8 06/30/2022 0732   LYMPHSABS 1.7 01/12/2017 1324   EOSABS 0.1 01/12/2017 1324   BASOSABS 0.0 01/12/2017 1324    Hgb A1C Lab Results  Component Value Date   HGBA1C 5.5 06/30/2022           Assessment & Plan:   Chronic right hip pain:  X-ray right hip today Rx for meloxicam 15 mg daily, avoid all other anti-inflammatories OTC.  RTC in 6 months, for your annual exam Nicki Reaper, NP

## 2022-12-19 NOTE — Assessment & Plan Note (Signed)
POCT A1c 5% We will discontinue Semglee Continue Mounjaro 10 mg weekly Encourage low-carb diet and exercise for weight loss Encouraged routine eye exam Encouraged routine foot exam She is getting her flu shot next week Pneumovax UTD

## 2022-12-19 NOTE — Assessment & Plan Note (Signed)
Stable on her current dose of citalopram, refilled today Support offered

## 2022-12-22 ENCOUNTER — Ambulatory Visit
Admission: RE | Admit: 2022-12-22 | Discharge: 2022-12-22 | Disposition: A | Discharge status: Home / Self Care | Payer: BC Managed Care – PPO | Attending: Internal Medicine | Admitting: Internal Medicine

## 2022-12-22 ENCOUNTER — Ambulatory Visit
Admission: RE | Admit: 2022-12-22 | Discharge: 2022-12-22 | Disposition: A | Discharge status: Home / Self Care | Payer: BC Managed Care – PPO | Source: Ambulatory Visit | Attending: Internal Medicine | Admitting: Internal Medicine

## 2022-12-22 DIAGNOSIS — G8929 Other chronic pain: Secondary | ICD-10-CM | POA: Insufficient documentation

## 2022-12-22 DIAGNOSIS — M25551 Pain in right hip: Secondary | ICD-10-CM | POA: Insufficient documentation

## 2022-12-24 NOTE — Progress Notes (Signed)
(  Key: [id number])  form thumbnail [employer] [place] is processing your PA request and will respond shortly with next steps. You may close this dialog, return to your dashboard, and perform other tasks. To check for an update later, open this request again from your dashboard.  If you need assistance, please chat with CoverMyMeds or call us at [phone].

## 2022-12-28 ENCOUNTER — Other Ambulatory Visit: Payer: Self-pay | Admitting: Internal Medicine

## 2022-12-29 ENCOUNTER — Encounter: Payer: Self-pay | Admitting: Internal Medicine

## 2022-12-29 ENCOUNTER — Other Ambulatory Visit: Payer: Self-pay | Admitting: Internal Medicine

## 2022-12-29 ENCOUNTER — Telehealth: Payer: BC Managed Care – PPO | Admitting: Internal Medicine

## 2022-12-29 DIAGNOSIS — J019 Acute sinusitis, unspecified: Secondary | ICD-10-CM | POA: Diagnosis not present

## 2022-12-29 DIAGNOSIS — B9689 Other specified bacterial agents as the cause of diseases classified elsewhere: Secondary | ICD-10-CM | POA: Diagnosis not present

## 2022-12-29 MED ORDER — DOXYCYCLINE HYCLATE 100 MG PO TABS: 100.0000 mg | ORAL_TABLET | Freq: Two times a day (BID) | ORAL | 0 refills | Status: DC

## 2022-12-29 MED ORDER — PROMETHAZINE HCL 12.5 MG PO TABS: 12.5000 mg | ORAL_TABLET | Freq: Three times a day (TID) | ORAL | 0 refills | Status: DC | PRN

## 2022-12-29 NOTE — Patient Instructions (Signed)

## 2022-12-29 NOTE — Telephone Encounter (Signed)
Requested medications are due for refill today.  A little soon   Requested medications are on the active medications list.  yes  Last refill. 10/02/2022 6mL 0 rf  Future visit scheduled.   yes  Notes to clinic.  Medication not assigned to a protocol. Please review for refill.    Requested Prescriptions  Pending Prescriptions Disp Refills   MOUNJARO 10 MG/0.5ML Pen [Pharmacy Med Name: Greggory Keen 10 MG/0.5ML Subcutaneous Solution Pen-injector] 12 mL 0    Sig: INJECT 10MG  INTO THE SKIN ONCE WEEKLY     Off-Protocol Failed - 12/28/2022  7:40 AM      Failed - Medication not assigned to a protocol, review manually.      Passed - Valid encounter within last 12 months    Recent Outpatient Visits           Today Acute bacterial sinusitis   [place] [facility] [place], [place], NP   1 week ago New onset type 2 diabetes mellitus [facility])   [place] [facility] [place], Salvadore Oxford, NP   6 months ago Encounter for general adult medical examination with abnormal findings   [place] [facility] [place], [place], NP   1 year ago Type 2 diabetes mellitus with hyperglycemia, without long-term current use of insulin [facility])   [place] [facility] [place], Salvadore Oxford, NP   1 year ago Urinary tract infection with hematuria, site unspecified   [place] [facility] Mecum, Oswaldo Conroy, PA-C       Future Appointments             In 5 months Baity, Salvadore Oxford, NP [facility], [facility]

## 2022-12-29 NOTE — Progress Notes (Signed)
Virtual Visit via Video Note  I connected with Sheri Hood on 12/29/22 at  4:00 PM EDT by a video enabled telemedicine application and verified that I am speaking with the correct person using two identifiers.  Location: Patient: Work Provider: Office  Persons participating in this video call: Nicki Reaper, NP and Henrene Pastor   I discussed the limitations of evaluation and management by telemedicine and the availability of in person appointments. The patient expressed understanding and agreed to proceed.  History of Present Illness:  Pt reports headache, sinus pressure, runny nose, ear pain, sore throat, hoarseness and cough. She reports this started 5 days ago.  She is blowing clear mucus out of her nose.  She denies get full to swallowing.  The cough is productive of mucus.  The cough is worse at night.  She denies fever, chills or bodyaches.  She has tried mucinex, sudafed, anthihistamine and delsym OTC with minimal relief of symptoms. She has had sick contacts with similar symptoms. She has not taken a home covid test.   Past Medical History:  Diagnosis Date   Allergy    Anemia    s/p ablation for heavy bleeding    Anxiety    Arthritis    arhtritis middle back, scolosis   Asthma    Depression    Diabetes mellitus without complication (HCC)    GERD (gastroesophageal reflux disease)    Headache(784.0)    h/x migraines   Heart murmur    recent diagnosed- / ehho 12/30/10 report on chart   Hiatal hernia    Hyperlipidemia    Hypertension    Recurrent upper respiratory infection (URI)    occ sore throat and slightly stuffy- no fever or cough   Sleep apnea    On CPAP   Sleep apnea     Current Outpatient Medications  Medication Sig Dispense Refill   albuterol (VENTOLIN HFA) 108 (90 Base) MCG/ACT inhaler Inhale 1-2 puffs into the lungs every 6 (six) hours as needed for wheezing or shortness of breath. 18 g 0   citalopram (CELEXA) 40 MG tablet Take 1 tablet (40 mg  total) by mouth daily. 90 tablet 1   glucose blood (BAYER CONTOUR NEXT TEST) test strip Use as instructed to test blood sugar once daily. E11.9 100 each 12   ibuprofen (ADVIL,MOTRIN) 200 MG tablet Take 200 mg by mouth every 6 (six) hours as needed. For pain     Insulin Pen Needle (B-D UF III MINI PEN NEEDLES) 31G X 5 MM MISC 1 each by Other route in the morning and at bedtime. 100 each 2   Insulin Pen Needle (BD PEN NEEDLE MICRO U/F) 32G X 6 MM MISC Use as directed. 100 each 0   lisinopril (ZESTRIL) 10 MG tablet Take 3 tablets (30 mg total) by mouth daily. 270 tablet 1   meloxicam (MOBIC) 15 MG tablet Take 1 tablet (15 mg total) by mouth daily. 90 tablet 0   methocarbamol (ROBAXIN) 500 MG tablet Take 1 tablet (500 mg total) by mouth every 8 (eight) hours as needed for muscle spasms. 20 tablet 0   Microlet Lancets MISC USE AS DIRECTED 1 ONCE DAILY TO TEST BLOOD SUGAR 100 each 0   omeprazole (PRILOSEC) 20 MG capsule Take 20 mg by mouth daily.     rosuvastatin (CRESTOR) 10 MG tablet Take 1 tablet (10 mg total) by mouth daily. 90 tablet 0   tirzepatide (MOUNJARO) 10 MG/0.5ML Pen Inject 10 mg into the skin once  a week. 6 mL 0   vitamin E 400 UNIT capsule Take 800 Units by mouth at bedtime.     Current Facility-Administered Medications  Medication Dose Route Frequency Provider Last Rate Last Admin   lidocaine (PF) (XYLOCAINE) 1 % injection 2 mL  2 mL Intradermal Once Mecum, Erin E, PA-C        Allergies  Allergen Reactions   Benadryl [Diphenhydramine Hcl] Other (See Comments)    Keeps awake, jittery   Codeine Other (See Comments)    Keeps awake   Dilaudid [Hydromorphone Hcl] Itching and Nausea Only   Morphine And Codeine Itching and Nausea Only    Family History  Problem Relation Age of Onset   Hypertension Mother    Glaucoma Mother    Heart disease Father    Obesity Sister    Diabetes Paternal Grandmother    Breast cancer Neg Hx    Colon cancer Neg Hx    Esophageal cancer Neg Hx     Rectal cancer Neg Hx    Stomach cancer Neg Hx     Social History   Socioeconomic History   Marital status: Married    Spouse name: Not on file   Number of children: Not on file   Years of education: Not on file   Highest education level: 12th grade  Occupational History   Not on file  Tobacco Use   Smoking status: Former    Current packs/day: 0.00    Average packs/day: 0.5 packs/day for 4.0 years (2.0 ttl pk-yrs)    Types: Cigarettes    Start date: 01/02/1984    Quit date: 01/02/1988    Years since quitting: 35.0   Smokeless tobacco: Never  Vaping Use   Vaping status: Never Used  Substance and Sexual Activity   Alcohol use: Yes    Alcohol/week: 0.0 standard drinks of alcohol    Comment: Occasional Drink   Drug use: No   Sexual activity: Never  Other Topics Concern   Not on file  Social History Narrative   Not on file   Social Determinants of Health   Financial Resource Strain: Low Risk  (12/18/2022)   Overall Financial Resource Strain (CARDIA)    Difficulty of Paying Living Expenses: Not very hard  Food Insecurity: No Food Insecurity (12/18/2022)   Hunger Vital Sign    Worried About Running Out of Food in the Last Year: Never true    Ran Out of Food in the Last Year: Never true  Transportation Needs: No Transportation Needs (12/18/2022)   PRAPARE - Administrator, Civil Service (Medical): No    Lack of Transportation (Non-Medical): No  Physical Activity: Unknown (12/18/2022)   Exercise Vital Sign    Days of Exercise per Week: 0 days    Minutes of Exercise per Session: Not on file  Stress: No Stress Concern Present (12/18/2022)   [employer] of Occupational Health - Occupational Stress Questionnaire    Feeling of Stress : Only a little  Social Connections: Moderately Integrated (12/18/2022)   Social Connection and Isolation Panel [NHANES]    Frequency of Communication with Friends and Family: More than three times a week    Frequency of Social  Gatherings with Friends and Family: Twice a week    Attends Religious Services: More than 4 times per year    Active Member of [employer] or Organizations: No    Attends Engineer, structural: Not on file    Marital Status: Married  Intimate  Partner Violence: Not on file     Constitutional: Patient reports headache.  Denies fever, malaise, fatigue, or abrupt weight changes.  HEENT: Patient reports runny nose, ear pain and sore throat.  Denies eye pain, eye redness, ringing in the ears, wax buildup, nasal congestion, bloody nose. Respiratory: Pt reports cough. Denies difficulty breathing, shortness of breath, or sputum production.   Cardiovascular: Denies chest pain, chest tightness, palpitations or swelling in the hands or feet.   No other specific complaints in a complete review of systems (except as listed in HPI above).    Observations/Objective:  Wt Readings from Last 3 Encounters:  12/19/22 145 lb (65.8 kg)  06/13/22 168 lb (76.2 kg)  12/06/21 182 lb (82.6 kg)    General: Appears her stated age, appears unwell but in NAD. HEENT: Head: normal shape and size;  Nose: Congestion noted; Throat/Mouth: Hoarseness noted Pulmonary/Chest: Normal effort.  Dry cough noted.  No respiratory distress.  Neurological: Alert and oriented.   BMET    Component Value Date/Time   NA 138 06/30/2022 0732   K 3.2 (L) 06/30/2022 0732   CL 98 06/30/2022 0732   CO2 24 06/30/2022 0732   GLUCOSE 73 06/30/2022 0732   GLUCOSE 127 (H) 11/16/2020 1259   BUN 21 06/30/2022 0732   CREATININE 1.05 (H) 06/30/2022 0732   CALCIUM 9.4 06/30/2022 0732   GFRNONAA >60 11/16/2020 1259   GFRAA 94 07/05/2019 1327    Lipid Panel     Component Value Date/Time   CHOL 138 06/30/2022 0732   TRIG 89 06/30/2022 0732   HDL 47 06/30/2022 0732   CHOLHDL 2.9 06/30/2022 0732   LDLCALC 74 06/30/2022 0732    CBC    Component Value Date/Time   WBC 4.1 06/30/2022 0732   WBC 4.8 11/16/2020 1259   RBC 4.59  06/30/2022 0732   RBC 4.84 11/16/2020 1259   HGB 13.0 06/30/2022 0732   HCT 39.9 06/30/2022 0732   PLT 194 06/30/2022 0732   MCV 87 06/30/2022 0732   MCH 28.3 06/30/2022 0732   MCH 29.5 11/16/2020 1259   MCHC 32.6 06/30/2022 0732   MCHC 35.0 11/16/2020 1259   RDW 12.8 06/30/2022 0732   LYMPHSABS 1.7 01/12/2017 1324   EOSABS 0.1 01/12/2017 1324   BASOSABS 0.0 01/12/2017 1324    Hgb A1C Lab Results  Component Value Date   HGBA1C 5.0 12/19/2022        Assessment and Plan:  Acute bacterial sinusitis:  Can use a neti pot which can be purchased from your local pharmacy Recommend Astelin and Flonase OTC Would prefer to avoid steroids given her history of diabetes Rx for doxycycline 100 mg twice daily x 10 days Rx for Promethazine DM cough syrup  RTC in 6 months for your annual exam  Follow Up Instructions:    I discussed the assessment and treatment plan with the patient. The patient was provided an opportunity to ask questions and all were answered. The patient agreed with the plan and demonstrated an understanding of the instructions.   The patient was advised to call back or seek an in-person evaluation if the symptoms worsen or if the condition fails to improve as anticipated.    Nicki Reaper, NP

## 2022-12-29 NOTE — Progress Notes (Signed)
(  Key: [id number])  form thumbnail This request has been approved.  Please note any additional information provided by [facility]  at the bottom of your screen.

## 2022-12-30 MED ORDER — MOUNJARO 10 MG/0.5ML ~~LOC~~ SOAJ: 10.0000 mg | SUBCUTANEOUS | 1 refills | Status: DC

## 2022-12-30 NOTE — Addendum Note (Signed)
Addended by: Judd Gaudier on: 12/30/2022 01:12 PM   Modules accepted: Orders

## 2022-12-30 NOTE — Telephone Encounter (Signed)
D/C 06/13/22. Requested Prescriptions  Refused Prescriptions Disp Refills   OZEMPIC, 2 MG/DOSE, 8 MG/3ML SOPN [Pharmacy Med Name: Ozempic (2 MG/DOSE) 8 MG/3ML Subcutaneous Solution Pen-injector] 9 mL 0    Sig: INJECT 2MG  INTO THE SKIN ONCE WEEKLY     Endocrinology:  Diabetes - GLP-1 Receptor Agonists - semaglutide Failed - 12/29/2022  5:46 PM      Failed - Cr in normal range and within 360 days    Creatinine, Ser  Date Value Ref Range Status  06/30/2022 1.05 (H) 0.57 - 1.00 mg/dL Final         Passed - HBA1C in normal range and within 180 days    Hemoglobin A1C  Date Value Ref Range Status  12/19/2022 5.0 4.0 - 5.6 % Final  10/17/2020 7.3  Final   HbA1c, POC (controlled diabetic range)  Date Value Ref Range Status  12/06/2021 5.7 0.0 - 7.0 % Final   Hgb A1c MFr Bld  Date Value Ref Range Status  06/30/2022 5.5 4.8 - 5.6 % Final    Comment:             Prediabetes: 5.7 - 6.4          Diabetes: >6.4          Glycemic control for adults with diabetes: <7.0          Passed - Valid encounter within last 6 months    Recent Outpatient Visits           Yesterday Acute bacterial sinusitis   [place] [facility] [place], Salvadore Oxford, NP   1 week ago New onset type 2 diabetes mellitus [facility])   [place] [facility] [place], Salvadore [place], NP   6 months ago Encounter for general adult medical examination with abnormal findings   [place] [facility] [place], [place], NP   1 year ago Type 2 diabetes mellitus with hyperglycemia, without long-term current use of insulin [facility])   [place] [facility] [place], [place], NP   1 year ago Urinary tract infection with hematuria, site unspecified   [place] [facility] M[place], Oswaldo Conroy, PA-C       Future Appointments             In 5 months Baity, Salvadore [place], NP [place] [facility], [facility]

## 2023-01-08 NOTE — Progress Notes (Signed)
(  Key: [id number]) Ozempic (1 MG/DOSE) 4MG /3ML pen-injectors Form [employer] Form Created 12 days ago Sent to [employer] Not Sent This PA has been accessed, but not sent to the plan. Please fill out the required fields below and click "Send to Plan."  This request has been automatically started by CoverMyMeds based on the expiration date of a previously approved PA. If your patient requires continued therapy and is still covered under the same insurance plan you can use this form to submit the renewal. Please review the information as patient clinical information may have changed since the previous approval. If your patient is covered under a different plan and you need help selecting the correct form, chat with [place] in the bottom-right of your screen or call us at [phone].  If you don't need this PA, click 'delete' on the left side of your screen.  __    The following information is provided by CoverMyMeds.  Patient Assistance Patient assistance and financial support may be available through the manufacturer's patient assistance program. For more information, and to see program requirements, follow the link below:   Patient assistance program: click here.  Diagnosis OZEMPIC is a glucagon-like peptide 1 (GLP-1) receptor agonist indicated as: an adjunct to diet and exercise to improve glycemic control in adults with type 2 diabetes mellitus  to reduce the risk of major adverse cardiovascular events in adults with type 2 diabetes mellitus and established cardiovascular disease Limitations of Use: Has not been studied in patients with a history of pancreatitis. Consider another antidiabetic therapy Not for treatment of type 1 diabetes mellitus   Prescribing Information: click here. Important Safety Information: click here.  Dosing Information Ozempic 0.25 mg or 0.5 mg/dose comes in a carton with one 3 mL pen. Quantity is  typically: 3 mL per 28 days (Note: while initiation is often 42 days, some plans may only cover a shorter period. Please check your patient's specific coverage to confirm) 9 mL per 84 days  Ozempic 1 mg/dose comes in a carton with one 3 mL pen. Quantity is typically: 3 mL per 28 days 9 mL per 84 days  Ozempic 2 mg/dose comes in a carton with one 3 mL pen. Quantity is typically: 3 mL per 28 days 9 mL per 84 days

## 2023-01-09 DIAGNOSIS — E119 Type 2 diabetes mellitus without complications: Secondary | ICD-10-CM | POA: Diagnosis not present

## 2023-01-09 LAB — HM DIABETES EYE EXAM

## 2023-01-10 LAB — CBC
Hematocrit: 42.9 % (ref 34.0–46.6)
Hemoglobin: 13.8 g/dL (ref 11.1–15.9)
MCH: 29.2 pg (ref 26.6–33.0)
MCHC: 32.2 g/dL (ref 31.5–35.7)
MCV: 91 fL (ref 79–97)
Platelets: 197 10*3/uL (ref 150–450)
RBC: 4.73 x10E6/uL (ref 3.77–5.28)
RDW: 12.6 % (ref 11.7–15.4)
WBC: 3.6 10*3/uL (ref 3.4–10.8)

## 2023-01-10 LAB — LIPID PANEL
Chol/HDL Ratio: 3.9 {ratio} (ref 0.0–4.4)
Cholesterol, Total: 216 mg/dL — ABNORMAL HIGH (ref 100–199)
HDL: 56 mg/dL (ref >39)
LDL Chol Calc (NIH): 148 mg/dL — ABNORMAL HIGH (ref 0–99)
Triglycerides: 70 mg/dL (ref 0–149)
VLDL Cholesterol Cal: 12 mg/dL (ref 5–40)

## 2023-01-14 ENCOUNTER — Encounter: Payer: Self-pay | Admitting: Internal Medicine

## 2023-01-16 NOTE — Progress Notes (Signed)
(  Key: [id number]) Ozempic (1 MG/DOSE) 4MG /3ML pen-injectors Form [employer] Form Created 20 days ago Sent to Plan 8 days ago Plan Response 8 days ago Submit Clinical Questions 8 days ago Determination 8 days ago Message from [employer] On File.Authorization On File.Please note that authorization is already on file for the requested medication and is effective through (12/25/2022 - 12/25/2023). Thank you!

## 2023-01-26 ENCOUNTER — Encounter: Payer: Self-pay | Admitting: Internal Medicine

## 2023-01-26 ENCOUNTER — Other Ambulatory Visit: Payer: Self-pay | Admitting: Internal Medicine

## 2023-01-27 MED ORDER — ROSUVASTATIN CALCIUM 10 MG PO TABS: 10.0000 mg | ORAL_TABLET | Freq: Every day | ORAL | 0 refills | Status: DC

## 2023-01-27 NOTE — Telephone Encounter (Signed)
Requested Prescriptions  Pending Prescriptions Disp Refills   rosuvastatin (CRESTOR) 10 MG tablet 90 tablet 0    Sig: Take 1 tablet (10 mg total) by mouth daily.     Cardiovascular:  Antilipid - Statins 2 Failed - 01/27/2023  9:53 AM      Failed - Cr in normal range and within 360 days    Creatinine, Ser  Date Value Ref Range Status  06/30/2022 1.05 (H) 0.57 - 1.00 mg/dL Final         Failed - Lipid Panel in normal range within the last 12 months    Cholesterol, Total  Date Value Ref Range Status  01/09/2023 216 (H) 100 - 199 mg/dL Final   LDL Chol Calc (NIH)  Date Value Ref Range Status  01/09/2023 148 (H) 0 - 99 mg/dL Final   HDL  Date Value Ref Range Status  01/09/2023 56 >39 mg/dL Final   Triglycerides  Date Value Ref Range Status  01/09/2023 70 0 - 149 mg/dL Final         Passed - Patient is not pregnant      Passed - Valid encounter within last 12 months    Recent Outpatient Visits           4 weeks ago Acute bacterial sinusitis   [place] [facility] [place], [place] W, NP   1 month ago New onset type 2 diabetes mellitus [facility])   [place] [facility] [place], Salvadore Oxford, NP   7 months ago Encounter for general adult medical examination with abnormal findings   [place] [facility] [place], [place] W, NP   1 year ago Type 2 diabetes mellitus with hyperglycemia, without long-term current use of insulin [facility])   [place] [facility] [place], [place], NP   1 year ago Urinary tract infection with hematuria, site unspecified   [place] [facility] Mecum, Oswaldo Conroy, PA-C       Future Appointments             In 4 months Baity, Salvadore Oxford, NP [place] [facility], [facility]

## 2023-02-03 ENCOUNTER — Other Ambulatory Visit: Payer: Self-pay | Admitting: Internal Medicine

## 2023-02-03 DIAGNOSIS — Z1231 Encounter for screening mammogram for malignant neoplasm of breast: Secondary | ICD-10-CM

## 2023-02-04 ENCOUNTER — Ambulatory Visit
Admission: RE | Admit: 2023-02-04 | Discharge: 2023-02-04 | Disposition: A | Discharge status: Home / Self Care | Payer: BC Managed Care – PPO | Source: Ambulatory Visit | Attending: Internal Medicine | Admitting: Internal Medicine

## 2023-02-04 DIAGNOSIS — Z1231 Encounter for screening mammogram for malignant neoplasm of breast: Secondary | ICD-10-CM | POA: Insufficient documentation

## 2023-03-27 ENCOUNTER — Other Ambulatory Visit: Payer: Self-pay

## 2023-03-27 ENCOUNTER — Encounter: Payer: Self-pay | Admitting: Internal Medicine

## 2023-03-27 MED ORDER — MOUNJARO 10 MG/0.5ML ~~LOC~~ SOAJ: 10.0000 mg | SUBCUTANEOUS | 1 refills | Status: DC

## 2023-03-27 NOTE — Progress Notes (Signed)
Pa started   Henrene Pastor (Key: [id number]) Rx #: [id number] Need Help? Call us at [phone] Status New (Not sent to plan) Drug Mounjaro 10MG /0.5ML auto-injectors ePA cloud logo Form [employer] Electronic Prior Authorization Form (2017 NCPDP) Original Claim Info 29   Spoke with kyle at [employer]. Approval 03/27/23-03/26/24

## 2023-06-10 ENCOUNTER — Encounter: Payer: Self-pay | Admitting: Internal Medicine

## 2023-06-10 ENCOUNTER — Other Ambulatory Visit: Payer: Self-pay

## 2023-06-10 ENCOUNTER — Other Ambulatory Visit: Payer: Self-pay | Admitting: Internal Medicine

## 2023-06-10 MED ORDER — CITALOPRAM HYDROBROMIDE 40 MG PO TABS: 40.0000 mg | ORAL_TABLET | Freq: Every day | ORAL | 1 refills | Status: DC

## 2023-06-10 MED ORDER — MOUNJARO 10 MG/0.5ML ~~LOC~~ SOAJ: 10.0000 mg | SUBCUTANEOUS | 1 refills | Status: DC

## 2023-06-10 MED ORDER — LISINOPRIL 10 MG PO TABS: 30.0000 mg | ORAL_TABLET | Freq: Every day | ORAL | 1 refills | Status: DC

## 2023-06-10 MED ORDER — ROSUVASTATIN CALCIUM 10 MG PO TABS: 10.0000 mg | ORAL_TABLET | Freq: Every day | ORAL | 0 refills | Status: DC

## 2023-06-22 ENCOUNTER — Encounter: Payer: Self-pay | Admitting: Internal Medicine

## 2023-06-22 ENCOUNTER — Ambulatory Visit (INDEPENDENT_AMBULATORY_CARE_PROVIDER_SITE_OTHER): Payer: BC Managed Care – PPO | Admitting: Internal Medicine

## 2023-06-22 VITALS — BP 110/68 | Ht 62.0 in | Wt 128.2 lb

## 2023-06-22 DIAGNOSIS — Z7985 Long-term (current) use of injectable non-insulin antidiabetic drugs: Secondary | ICD-10-CM

## 2023-06-22 DIAGNOSIS — Z0001 Encounter for general adult medical examination with abnormal findings: Secondary | ICD-10-CM

## 2023-06-22 DIAGNOSIS — Z23 Encounter for immunization: Secondary | ICD-10-CM | POA: Diagnosis not present

## 2023-06-22 DIAGNOSIS — E119 Type 2 diabetes mellitus without complications: Secondary | ICD-10-CM | POA: Diagnosis not present

## 2023-06-22 NOTE — Patient Instructions (Signed)
 Health Maintenance for Postmenopausal Women Menopause is a normal process in which your ability to get pregnant comes to an end. This process happens slowly over many months or years, usually between the ages of 24 and 62. Menopause is complete when you have missed your menstrual period for 12 months. It is important to talk with your health care provider about some of the most common conditions that affect women after menopause (postmenopausal women). These include heart disease, cancer, and bone loss (osteoporosis). Adopting a healthy lifestyle and getting preventive care can help to promote your health and wellness. The actions you take can also lower your chances of developing some of these common conditions. What are the signs and symptoms of menopause? During menopause, you may have the following symptoms: Hot flashes. These can be moderate or severe. Night sweats. Decrease in sex drive. Mood swings. Headaches. Tiredness (fatigue). Irritability. Memory problems. Problems falling asleep or staying asleep. Talk with your health care provider about treatment options for your symptoms. Do I need hormone replacement therapy? Hormone replacement therapy is effective in treating symptoms that are caused by menopause, such as hot flashes and night sweats. Hormone replacement carries certain risks, especially as you become older. If you are thinking about using estrogen or estrogen with progestin, discuss the benefits and risks with your health care provider. How can I reduce my risk for heart disease and stroke? The risk of heart disease, heart attack, and stroke increases as you age. One of the causes may be a change in the body's hormones during menopause. This can affect how your body uses dietary fats, triglycerides, and cholesterol. Heart attack and stroke are medical emergencies. There are many things that you can do to help prevent heart disease and stroke. Watch your blood pressure High  blood pressure causes heart disease and increases the risk of stroke. This is more likely to develop in people who have high blood pressure readings or are overweight. Have your blood pressure checked: Every 3-5 years if you are 50-75 years of age. Every year if you are 77 years old or older. Eat a healthy diet  Eat a diet that includes plenty of vegetables, fruits, low-fat dairy products, and lean protein. Do not eat a lot of foods that are high in solid fats, added sugars, or sodium. Get regular exercise Get regular exercise. This is one of the most important things you can do for your health. Most adults should: Try to exercise for at least 150 minutes each week. The exercise should increase your heart rate and make you sweat (moderate-intensity exercise). Try to do strengthening exercises at least twice each week. Do these in addition to the moderate-intensity exercise. Spend less time sitting. Even light physical activity can be beneficial. Other tips Work with your health care provider to achieve or maintain a healthy weight. Do not use any products that contain nicotine or tobacco. These products include cigarettes, chewing tobacco, and vaping devices, such as e-cigarettes. If you need help quitting, ask your health care provider. Know your numbers. Ask your health care provider to check your cholesterol and your blood sugar (glucose). Continue to have your blood tested as directed by your health care provider. Do I need screening for cancer? Depending on your health history and family history, you may need to have cancer screenings at different stages of your life. This may include screening for: Breast cancer. Cervical cancer. Lung cancer. Colorectal cancer. What is my risk for osteoporosis? After menopause, you may be  at increased risk for osteoporosis. Osteoporosis is a condition in which bone destruction happens more quickly than new bone creation. To help prevent osteoporosis or  the bone fractures that can happen because of osteoporosis, you may take the following actions: If you are 61-3 years old, get at least 1,000 mg of calcium and at least 600 international units (IU) of vitamin D per day. If you are older than age 61 but younger than age 75, get at least 1,200 mg of calcium and at least 600 international units (IU) of vitamin D per day. If you are older than age 62, get at least 1,200 mg of calcium and at least 800 international units (IU) of vitamin D per day. Smoking and drinking excessive alcohol increase the risk of osteoporosis. Eat foods that are rich in calcium and vitamin D, and do weight-bearing exercises several times each week as directed by your health care provider. How does menopause affect my mental health? Depression may occur at any age, but it is more common as you become older. Common symptoms of depression include: Feeling depressed. Changes in sleep patterns. Changes in appetite or eating patterns. Feeling an overall lack of motivation or enjoyment of activities that you previously enjoyed. Frequent crying spells. Talk with your health care provider if you think that you are experiencing any of these symptoms. General instructions See your health care provider for regular wellness exams and vaccines. This may include: Scheduling regular health, dental, and eye exams. Getting and maintaining your vaccines. These include: Influenza vaccine. Get this vaccine each year before the flu season begins. Pneumonia vaccine. Shingles vaccine. Tetanus, diphtheria, and pertussis (Tdap) booster vaccine. Your health care provider may also recommend other immunizations. Tell your health care provider if you have ever been abused or do not feel safe at home. Summary Menopause is a normal process in which your ability to get pregnant comes to an end. This condition causes hot flashes, night sweats, decreased interest in sex, mood swings, headaches, or lack  of sleep. Treatment for this condition may include hormone replacement therapy. Take actions to keep yourself healthy, including exercising regularly, eating a healthy diet, watching your weight, and checking your blood pressure and blood sugar levels. Get screened for cancer and depression. Make sure that you are up to date with all your vaccines. This information is not intended to replace advice given to you by your health care provider. Make sure you discuss any questions you have with your health care provider. Document Revised: 07/09/2020 Document Reviewed: 07/09/2020 Elsevier Patient Education  2024 [employer].

## 2023-06-22 NOTE — Progress Notes (Signed)
 Subjective:    Patient ID: Sheri Hood, female    DOB: 1965/04/13, 58 y.o.   MRN: [id number]  HPI  Patient presents to clinic today for annual exam.  Flu: 12/2021 Tetanus: 01/2014 COVID: never Pneumovax: 12/2021 Shingrix : 12/2021, 06/2022 Pap smear: 12/2021 Mammogram: 02/2023 Colon screening: 05/2018 Vision screening: annually Dentist: biannually  Diet: She does eat meat. She consumes fruits and veggies. She does eat some fried foods. She drinks mostly coffee, Dt. Coke, water Exercise: None  Review of Systems     Past Medical History:  Diagnosis Date   Allergy    Anemia    s/p ablation for heavy bleeding    Anxiety    Arthritis    arhtritis middle back, scolosis   Asthma    Depression    Diabetes mellitus without complication (HCC)    GERD (gastroesophageal reflux disease)    Headache(784.0)    h/x migraines   Heart murmur    recent diagnosed- / ehho 12/30/10 report on chart   Hiatal hernia    Hyperlipidemia    Hypertension    Recurrent upper respiratory infection (URI)    occ sore throat and slightly stuffy- no fever or cough   Sleep apnea    On CPAP   Sleep apnea     Current Outpatient Medications  Medication Sig Dispense Refill   albuterol  (VENTOLIN  HFA) 108 (90 Base) MCG/ACT inhaler Inhale 1-2 puffs into the lungs every 6 (six) hours as needed for wheezing or shortness of breath. 18 g 0   citalopram  (CELEXA ) 40 MG tablet Take 1 tablet (40 mg total) by mouth daily. 90 tablet 1   doxycycline  (VIBRA -TABS) 100 MG tablet Take 1 tablet (100 mg total) by mouth 2 (two) times daily. 20 tablet 0   glucose blood (BAYER CONTOUR NEXT TEST) test strip Use as instructed to test blood sugar once daily. E11.9 100 each 12   ibuprofen (ADVIL,MOTRIN) 200 MG tablet Take 200 mg by mouth every 6 (six) hours as needed. For pain     Insulin  Pen Needle (B-D UF III MINI PEN NEEDLES) 31G X 5 MM MISC 1 each by Other route in the morning and at bedtime. 100 each 2   Insulin  Pen  Needle (BD PEN NEEDLE MICRO U/F) 32G X 6 MM MISC Use as directed. 100 each 0   lisinopril  (ZESTRIL ) 10 MG tablet Take 3 tablets (30 mg total) by mouth daily. 270 tablet 1   meloxicam  (MOBIC ) 15 MG tablet Take 1 tablet (15 mg total) by mouth daily. 90 tablet 0   methocarbamol  (ROBAXIN ) 500 MG tablet Take 1 tablet (500 mg total) by mouth every 8 (eight) hours as needed for muscle spasms. 20 tablet 0   Microlet Lancets MISC USE AS DIRECTED 1 ONCE DAILY TO TEST BLOOD SUGAR 100 each 0   omeprazole  (PRILOSEC ) 20 MG capsule Take 20 mg by mouth daily.     promethazine  (PHENERGAN ) 12.5 MG tablet Take 1 tablet (12.5 mg total) by mouth every 8 (eight) hours as needed for nausea or vomiting. 20 tablet 0   rosuvastatin  (CRESTOR ) 10 MG tablet Take 1 tablet (10 mg total) by mouth daily. 90 tablet 0   tirzepatide  (MOUNJARO ) 10 MG/0.5ML Pen Inject 10 mg into the skin once a week. 6 mL 1   vitamin E  400 UNIT capsule Take 800 Units by mouth at bedtime.     Current Facility-Administered Medications  Medication Dose Route Frequency Provider Last Rate Last Admin   lidocaine  (PF) (  XYLOCAINE ) 1 % injection 2 mL  2 mL Intradermal Once Mecum, Erin E, PA-C        Allergies  Allergen Reactions   Benadryl [Diphenhydramine Hcl] Other (See Comments)    Keeps awake, jittery   Codeine Other (See Comments)    Keeps awake   Dilaudid  [Hydromorphone  Hcl] Itching and Nausea Only   Morphine And Codeine Itching and Nausea Only    Family History  Problem Relation Age of Onset   Hypertension Mother    Glaucoma Mother    Heart disease Father    Obesity Sister    Diabetes Paternal Grandmother    Breast cancer Neg Hx    Colon cancer Neg Hx    Esophageal cancer Neg Hx    Rectal cancer Neg Hx    Stomach cancer Neg Hx     Social History   Socioeconomic History   Marital status: Married    Spouse name: Not on file   Number of children: Not on file   Years of education: Not on file   Highest education level: 12th grade   Occupational History   Not on file  Tobacco Use   Smoking status: Former    Current packs/day: 0.00    Average packs/day: 0.5 packs/day for 4.0 years (2.0 ttl pk-yrs)    Types: Cigarettes    Start date: 01/02/1984    Quit date: 01/02/1988    Years since quitting: 35.4   Smokeless tobacco: Never  Vaping Use   Vaping status: Never Used  Substance and Sexual Activity   Alcohol use: Yes    Alcohol/week: 0.0 standard drinks of alcohol    Comment: Occasional Drink   Drug use: No   Sexual activity: Never  Other Topics Concern   Not on file  Social History Narrative   Not on file   Social Drivers of Health   Financial Resource Strain: Low Risk  (12/18/2022)   Overall Financial Resource Strain (CARDIA)    Difficulty of Paying Living Expenses: Not very hard  Food Insecurity: No Food Insecurity (12/18/2022)   Hunger Vital Sign    Worried About Running Out of Food in the Last Year: Never true    Ran Out of Food in the Last Year: Never true  Transportation Needs: No Transportation Needs (12/18/2022)   PRAPARE - Administrator, Civil Service (Medical): No    Lack of Transportation (Non-Medical): No  Physical Activity: Unknown (12/18/2022)   Exercise Vital Sign    Days of Exercise per Week: 0 days    Minutes of Exercise per Session: Not on file  Stress: No Stress Concern Present (12/18/2022)   [employer] of Occupational Health - Occupational Stress Questionnaire    Feeling of Stress : Only a little  Social Connections: Moderately Integrated (12/18/2022)   Social Connection and Isolation Panel [NHANES]    Frequency of Communication with Friends and Family: More than three times a week    Frequency of Social Gatherings with Friends and Family: Twice a week    Attends Religious Services: More than 4 times per year    Active Member of [employer] or Organizations: No    Attends Engineer, structural: Not on file    Marital Status: Married  Catering manager  Violence: Not on file     Constitutional: Denies fever, malaise, fatigue, headache or abrupt weight changes.  HEENT: Denies eye pain, eye redness, ear pain, ringing in the ears, wax buildup, runny nose, nasal congestion, bloody nose,  or sore throat. Respiratory: Denies difficulty breathing, shortness of breath, cough or sputum production.   Cardiovascular: Denies chest pain, chest tightness, palpitations or swelling in the hands or feet.  Gastrointestinal: Patient reports intermittent constipation.  Denies abdominal pain, bloating,  diarrhea or blood in the stool.  GU: Denies urgency, frequency, pain with urination, burning sensation, blood in urine, odor or discharge. Musculoskeletal: Patient reports intermittent joint pain, chronic right side back pain.  Denies decrease in range of motion, difficulty with gait, muscle pain or joint swelling.  Skin: Denies redness, rashes, lesions or ulcercations.  Neurological: Denies dizziness, difficulty with memory, difficulty with speech or problems with balance and coordination.  Psych: Patient has a history of anxiety and depression.  Denies SI/HI.  No other specific complaints in a complete review of systems (except as listed in HPI above).  Objective:   Physical Exam   BP 110/68 (BP Location: Left Arm, Patient Position: Sitting, Cuff Size: Normal)   Ht 5\' 2"  (1.575 m)   Wt 128 lb 3.2 oz (58.2 kg)   BMI 23.45 kg/m    Wt Readings from Last 3 Encounters:  12/19/22 145 lb (65.8 kg)  06/13/22 168 lb (76.2 kg)  12/06/21 182 lb (82.6 kg)    General: Appears her stated age, in NAD. Skin: Warm, dry and intact. No ulcerations noted. HEENT: Head: normal shape and size; Eyes: sclera white, no icterus, conjunctiva pink, PERRLA and EOMs intact;  Cardiovascular: Normal rate and rhythm. S1,S2 noted.  No murmur, rubs or gallops noted. No JVD or BLE edema. No carotid bruits noted. Pulmonary/Chest: Normal effort and positive vesicular breath sounds. No  respiratory distress. No wheezes, rales or ronchi noted.  Abdomen:  Normal bowel sounds. Musculoskeletal: Kyphotic. Strength 5/5 BUE/BLE.  No difficulty with gait.  Neurological: Alert and oriented. Cranial nerves II-XII grossly intact. Coordination normal.  Psychiatric: Mood and affect normal. Behavior is normal. Judgment and thought content normal.    BMET    Component Value Date/Time   NA 138 06/30/2022 0732   K 3.2 (L) 06/30/2022 0732   CL 98 06/30/2022 0732   CO2 24 06/30/2022 0732   GLUCOSE 73 06/30/2022 0732   GLUCOSE 127 (H) 11/16/2020 1259   BUN 21 06/30/2022 0732   CREATININE 1.05 (H) 06/30/2022 0732   CALCIUM  9.4 06/30/2022 0732   GFRNONAA >60 11/16/2020 1259   GFRAA 94 07/05/2019 1327    Lipid Panel     Component Value Date/Time   CHOL 216 (H) 01/09/2023 0922   TRIG 70 01/09/2023 0922   HDL 56 01/09/2023 0922   CHOLHDL 3.9 01/09/2023 0922   LDLCALC 148 (H) 01/09/2023 0922    CBC    Component Value Date/Time   WBC 3.6 01/09/2023 0922   WBC 4.8 11/16/2020 1259   RBC 4.73 01/09/2023 0922   RBC 4.84 11/16/2020 1259   HGB 13.8 01/09/2023 0922   HCT 42.9 01/09/2023 0922   PLT 197 01/09/2023 0922   MCV 91 01/09/2023 0922   MCH 29.2 01/09/2023 0922   MCH 29.5 11/16/2020 1259   MCHC 32.2 01/09/2023 0922   MCHC 35.0 11/16/2020 1259   RDW 12.6 01/09/2023 0922   LYMPHSABS 1.7 01/12/2017 1324   EOSABS 0.1 01/12/2017 1324   BASOSABS 0.0 01/12/2017 1324    Hgb A1C Lab Results  Component Value Date   HGBA1C 5.0 12/19/2022           Assessment & Plan:   Preventative Health Maintenance:  Encouraged her to get a  flu shot in the fall Tetanus today Encouraged her to get her COVID-vaccine Pneumovax UTD Shingrix  UTD Pap smear UTD Mammogram ordered, she will call to schedule Colon screening UTD Encouraged her to consume a balanced diet and exercise regimen Advised her to see an eye doctor and dentist annually We will check CBC, c-Met, lipid, A1c and  urine microalbumin today    RTC in 6 months, follow-up chronic conditions Helayne Lo, NP

## 2023-06-30 DIAGNOSIS — L728 Other follicular cysts of the skin and subcutaneous tissue: Secondary | ICD-10-CM | POA: Diagnosis not present

## 2023-06-30 DIAGNOSIS — D485 Neoplasm of uncertain behavior of skin: Secondary | ICD-10-CM | POA: Diagnosis not present

## 2023-07-07 ENCOUNTER — Encounter: Payer: Self-pay | Admitting: Internal Medicine

## 2023-07-07 ENCOUNTER — Telehealth: Payer: Self-pay

## 2023-07-07 NOTE — Telephone Encounter (Signed)
 Opened in error

## 2023-07-17 ENCOUNTER — Encounter: Payer: Self-pay | Admitting: Internal Medicine

## 2023-07-20 DIAGNOSIS — E119 Type 2 diabetes mellitus without complications: Secondary | ICD-10-CM | POA: Diagnosis not present

## 2023-07-20 DIAGNOSIS — Z0001 Encounter for general adult medical examination with abnormal findings: Secondary | ICD-10-CM | POA: Diagnosis not present

## 2023-07-21 ENCOUNTER — Ambulatory Visit: Payer: Self-pay | Admitting: Internal Medicine

## 2023-07-21 LAB — COMPREHENSIVE METABOLIC PANEL WITH GFR
ALT: 18 IU/L (ref 0–32)
AST: 16 IU/L (ref 0–40)
Albumin: 4.4 g/dL (ref 3.8–4.9)
Alkaline Phosphatase: 50 IU/L (ref 44–121)
BUN/Creatinine Ratio: 20 (ref 9–23)
BUN: 14 mg/dL (ref 6–24)
Bilirubin Total: 0.5 mg/dL (ref 0.0–1.2)
CO2: 22 mmol/L (ref 20–29)
Calcium: 9.3 mg/dL (ref 8.7–10.2)
Chloride: 104 mmol/L (ref 96–106)
Creatinine, Ser: 0.7 mg/dL (ref 0.57–1.00)
Globulin, Total: 1.7 g/dL (ref 1.5–4.5)
Glucose: 76 mg/dL (ref 70–99)
Potassium: 4.2 mmol/L (ref 3.5–5.2)
Sodium: 139 mmol/L (ref 134–144)
Total Protein: 6.1 g/dL (ref 6.0–8.5)
eGFR: 101 mL/min/{1.73_m2} (ref >59)

## 2023-07-21 LAB — LIPID PANEL
Chol/HDL Ratio: 2.7 ratio (ref 0.0–4.4)
Cholesterol, Total: 137 mg/dL (ref 100–199)
HDL: 50 mg/dL (ref >39)
LDL Chol Calc (NIH): 71 mg/dL (ref 0–99)
Triglycerides: 81 mg/dL (ref 0–149)
VLDL Cholesterol Cal: 16 mg/dL (ref 5–40)

## 2023-07-21 LAB — CBC
Hematocrit: 42.6 % (ref 34.0–46.6)
Hemoglobin: 13.6 g/dL (ref 11.1–15.9)
MCH: 29.1 pg (ref 26.6–33.0)
MCHC: 31.9 g/dL (ref 31.5–35.7)
MCV: 91 fL (ref 79–97)
Platelets: 186 10*3/uL (ref 150–450)
RBC: 4.67 x10E6/uL (ref 3.77–5.28)
RDW: 12.5 % (ref 11.7–15.4)
WBC: 3.7 10*3/uL (ref 3.4–10.8)

## 2023-07-21 LAB — MICROALBUMIN / CREATININE URINE RATIO
Creatinine, Urine: 151.5 mg/dL
Microalb/Creat Ratio: 16 mg/g{creat} (ref 0–29)
Microalbumin, Urine: 24.7 ug/mL

## 2023-07-21 LAB — HEMOGLOBIN A1C
Est. average glucose Bld gHb Est-mCnc: 100 mg/dL
Hgb A1c MFr Bld: 5.1 % (ref 4.8–5.6)

## 2023-08-31 DIAGNOSIS — L570 Actinic keratosis: Secondary | ICD-10-CM | POA: Diagnosis not present

## 2023-09-10 ENCOUNTER — Other Ambulatory Visit: Payer: Self-pay | Admitting: Internal Medicine

## 2023-09-11 NOTE — Telephone Encounter (Signed)
 Requested Prescriptions  Pending Prescriptions Disp Refills   rosuvastatin  (CRESTOR ) 10 MG tablet [Pharmacy Med Name: Rosuvastatin  Calcium  10 MG Oral Tablet] 90 tablet 0    Sig: Take 1 tablet by mouth once daily     Cardiovascular:  Antilipid - Statins 2 Failed - 09/11/2023  3:44 PM      Failed - Lipid Panel in normal range within the last 12 months    Cholesterol, Total  Date Value Ref Range Status  07/20/2023 137 100 - 199 mg/dL Final   LDL Chol Calc (NIH)  Date Value Ref Range Status  07/20/2023 71 0 - 99 mg/dL Final   HDL  Date Value Ref Range Status  07/20/2023 50 >39 mg/dL Final   Triglycerides  Date Value Ref Range Status  07/20/2023 81 0 - 149 mg/dL Final         Passed - Cr in normal range and within 360 days    Creatinine, Ser  Date Value Ref Range Status  07/20/2023 0.70 0.57 - 1.00 mg/dL Final         Passed - Patient is not pregnant      Passed - Valid encounter within last 12 months    Recent Outpatient Visits           2 months ago Encounter for general adult medical examination with abnormal findings   [place] [facility] [place], Angeline ORN, NP

## 2023-10-03 IMAGING — MG MM DIGITAL SCREENING BILAT W/ TOMO AND CAD
8 series · 8 of 24 positions shown · non-contrast
Comparison: Previous exam(s).

CLINICAL DATA: Screening.

EXAM:
DIGITAL SCREENING BILATERAL MAMMOGRAM WITH TOMOSYNTHESIS AND CAD
TECHNIQUE: Bilateral screening digital craniocaudal and mediolateral oblique
mammograms were obtained. Bilateral screening digital breast
tomosynthesis was performed. The images were evaluated with
computer-aided detection.

[R MLO synth-2D]
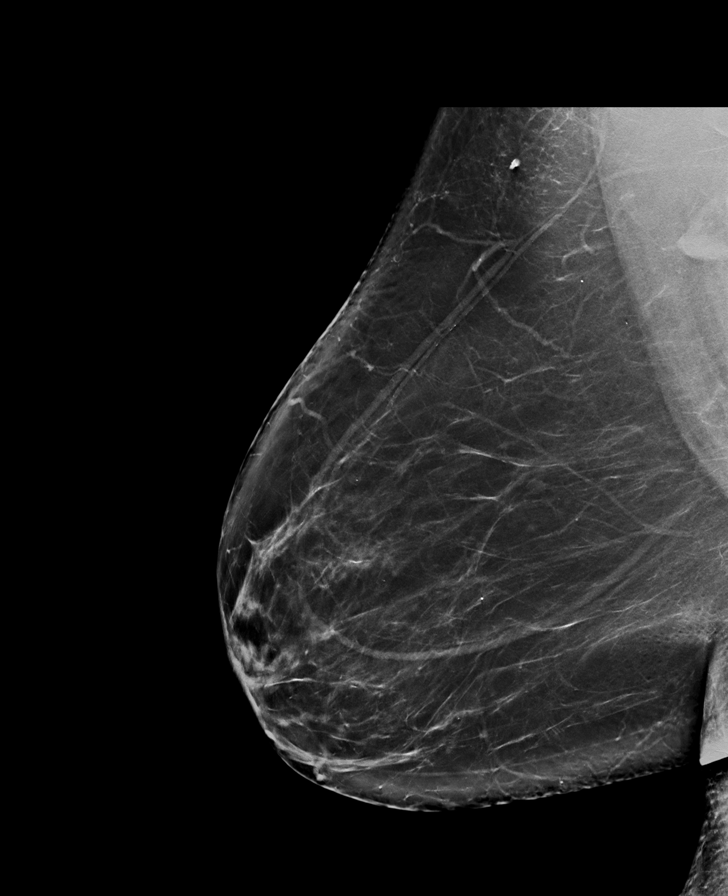

[L CC synth-2D]
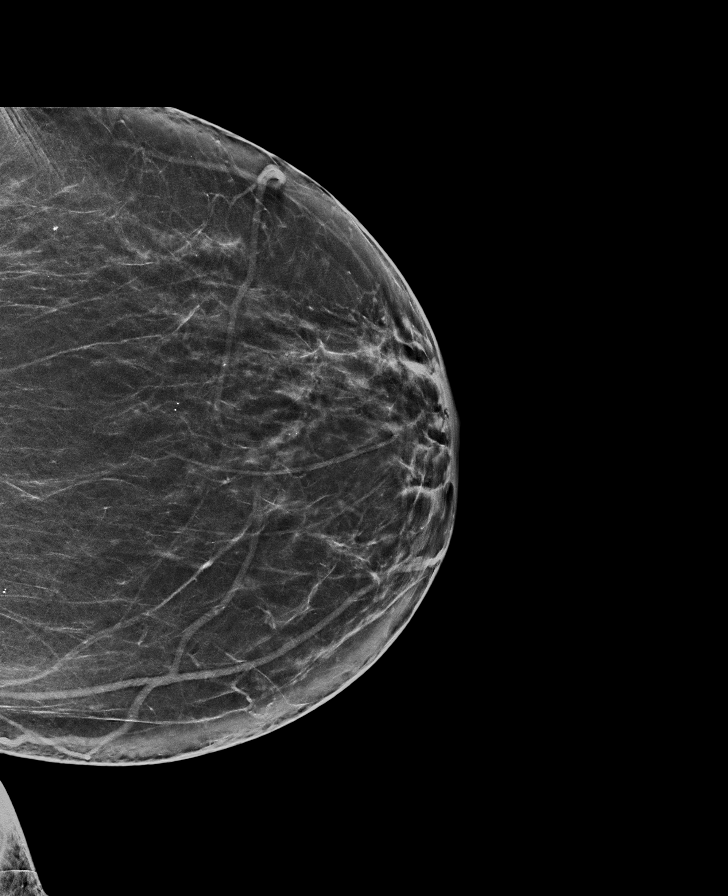

[L MLO synth-2D]
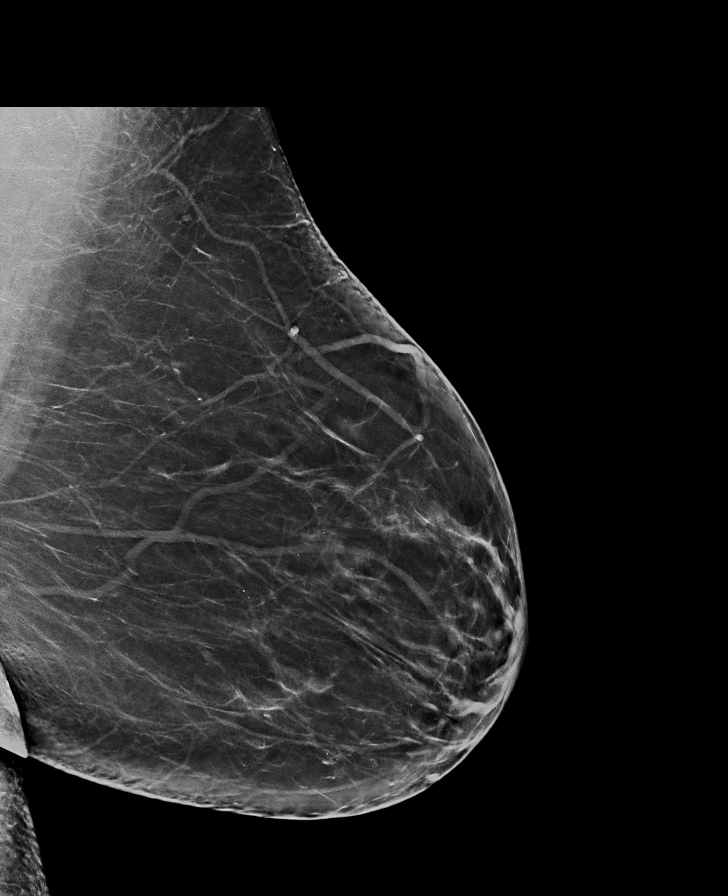

[R CC synth-2D]
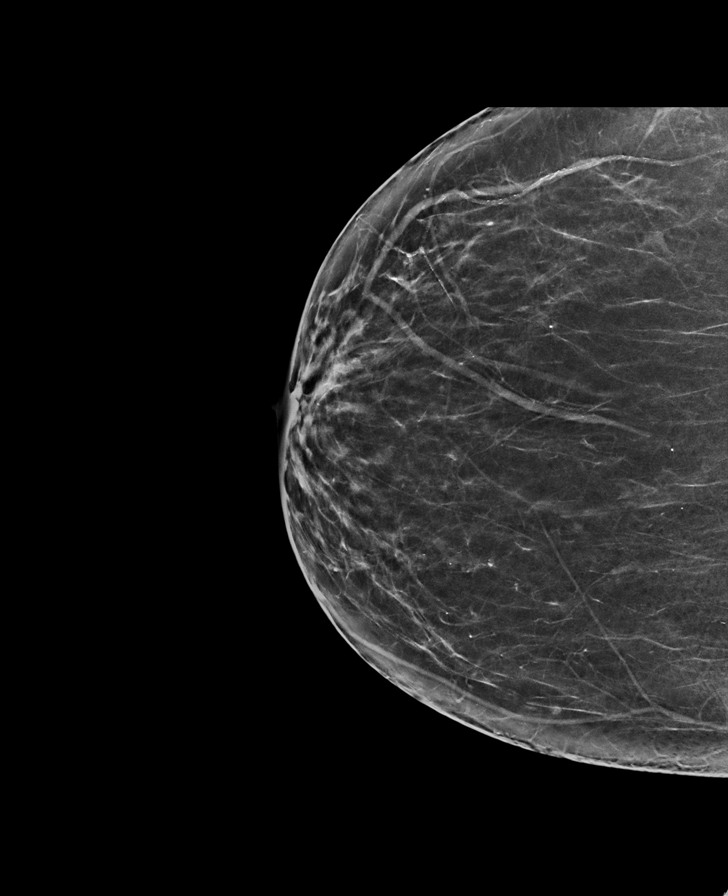

[R CC tomo · tomo slice 35/70.0]
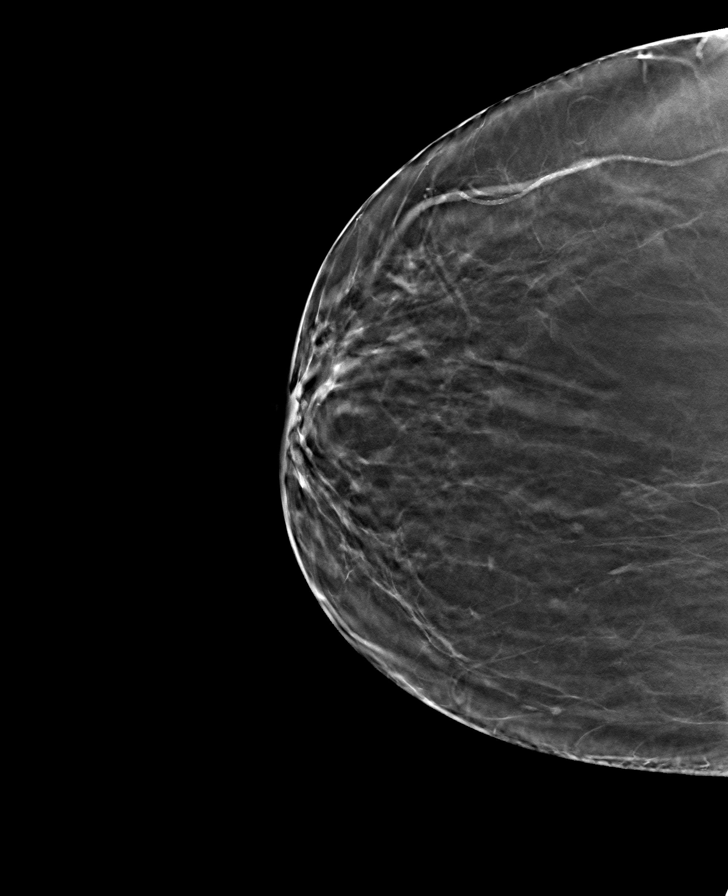

[L MLO tomo · tomo slice 43/85.0]
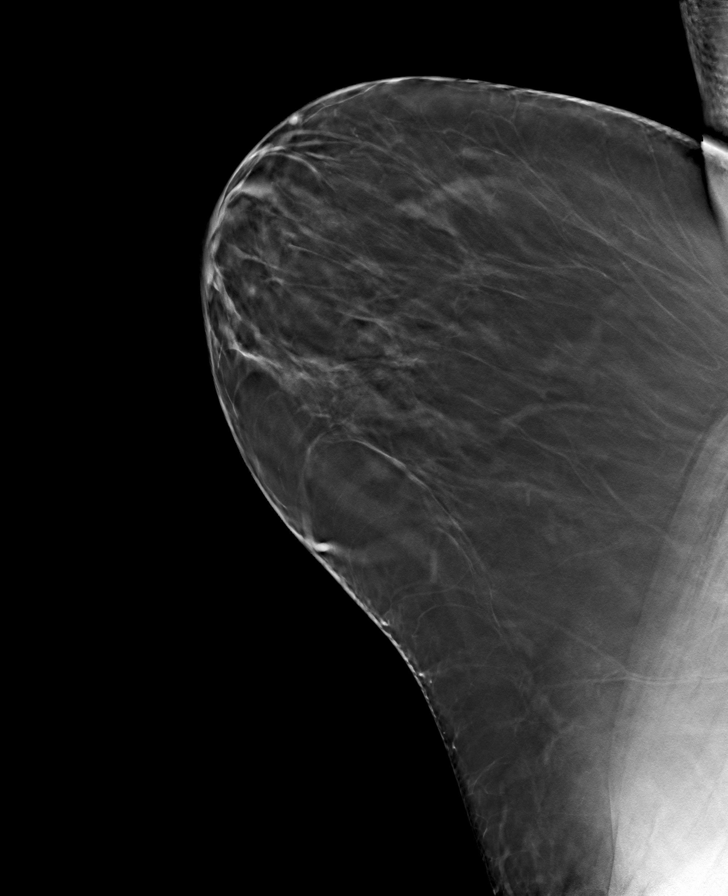

[L CC tomo · tomo slice 37/73.0]
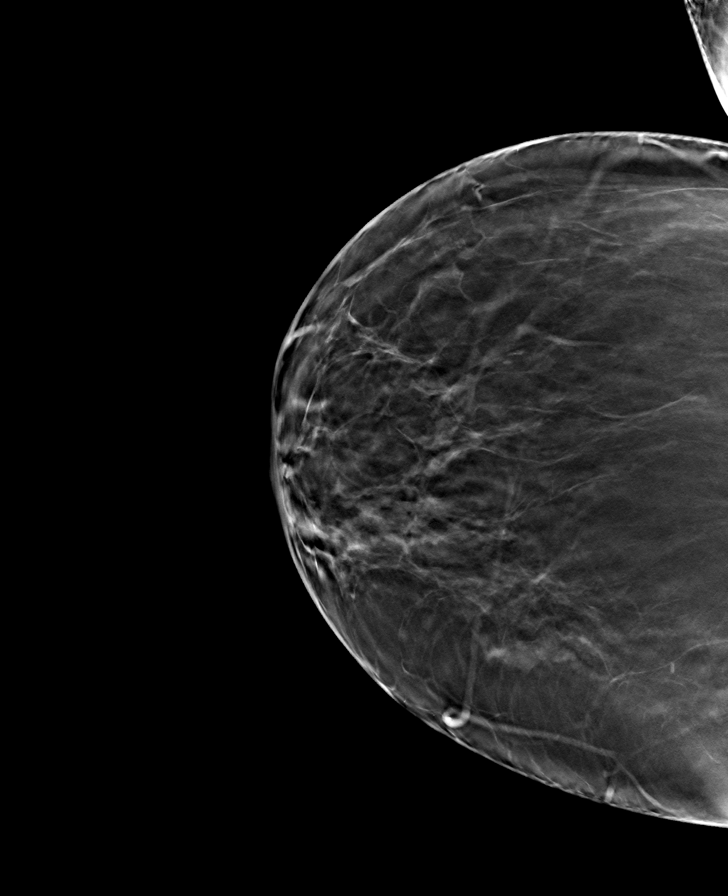

[R MLO tomo · tomo slice 47/94.0]
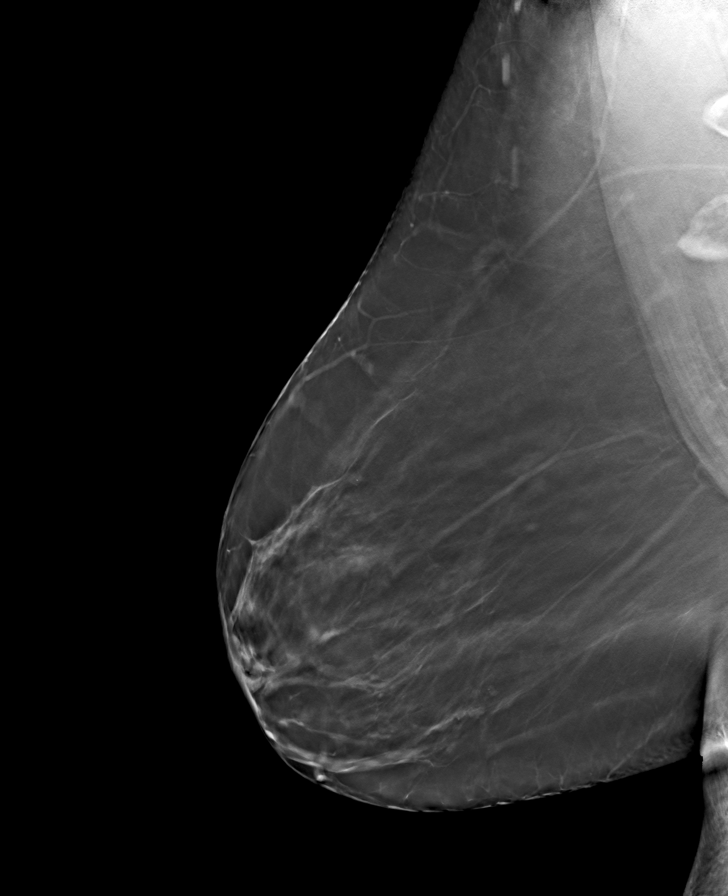

[8 of 24 positions shown; findings below may reference images not displayed]

ACR Breast Density Category b: There are scattered areas of
fibroglandular density.
FINDINGS: There are no findings suspicious for malignancy.
IMPRESSION: No mammographic evidence of malignancy. A result letter of this
screening mammogram will be mailed directly to the patient.

RECOMMENDATION:
Screening mammogram in one year. (Code:51-O-LD2)

BI-RADS CATEGORY  1: Negative.

## 2023-11-16 DIAGNOSIS — L680 Hirsutism: Secondary | ICD-10-CM | POA: Diagnosis not present

## 2023-11-30 ENCOUNTER — Encounter: Payer: Self-pay | Admitting: Internal Medicine

## 2023-11-30 MED ORDER — MOUNJARO 10 MG/0.5ML ~~LOC~~ SOAJ: 10.0000 mg | SUBCUTANEOUS | 1 refills | Status: AC

## 2023-12-11 ENCOUNTER — Other Ambulatory Visit: Payer: Self-pay

## 2023-12-11 ENCOUNTER — Encounter: Payer: Self-pay | Admitting: Internal Medicine

## 2023-12-11 MED ORDER — ROSUVASTATIN CALCIUM 10 MG PO TABS: 10.0000 mg | ORAL_TABLET | Freq: Every day | ORAL | 0 refills | Status: DC

## 2023-12-15 ENCOUNTER — Encounter: Payer: Self-pay | Admitting: Internal Medicine

## 2023-12-15 ENCOUNTER — Other Ambulatory Visit: Payer: Self-pay

## 2023-12-15 MED ORDER — LISINOPRIL 10 MG PO TABS: 30.0000 mg | ORAL_TABLET | Freq: Every day | ORAL | 1 refills | Status: AC

## 2023-12-22 ENCOUNTER — Ambulatory Visit: Admitting: Internal Medicine

## 2023-12-22 ENCOUNTER — Encounter: Payer: Self-pay | Admitting: Internal Medicine

## 2023-12-22 VITALS — BP 132/80 | Ht 62.0 in | Wt 125.4 lb

## 2023-12-22 DIAGNOSIS — F419 Anxiety disorder, unspecified: Secondary | ICD-10-CM

## 2023-12-22 DIAGNOSIS — E119 Type 2 diabetes mellitus without complications: Secondary | ICD-10-CM

## 2023-12-22 DIAGNOSIS — J4520 Mild intermittent asthma, uncomplicated: Secondary | ICD-10-CM

## 2023-12-22 DIAGNOSIS — E785 Hyperlipidemia, unspecified: Secondary | ICD-10-CM

## 2023-12-22 DIAGNOSIS — G4733 Obstructive sleep apnea (adult) (pediatric): Secondary | ICD-10-CM | POA: Diagnosis not present

## 2023-12-22 DIAGNOSIS — K219 Gastro-esophageal reflux disease without esophagitis: Secondary | ICD-10-CM

## 2023-12-22 DIAGNOSIS — I10 Essential (primary) hypertension: Secondary | ICD-10-CM | POA: Diagnosis not present

## 2023-12-22 DIAGNOSIS — Z7985 Long-term (current) use of injectable non-insulin antidiabetic drugs: Secondary | ICD-10-CM

## 2023-12-22 DIAGNOSIS — E1169 Type 2 diabetes mellitus with other specified complication: Secondary | ICD-10-CM

## 2023-12-22 DIAGNOSIS — Z23 Encounter for immunization: Secondary | ICD-10-CM

## 2023-12-22 DIAGNOSIS — F32A Depression, unspecified: Secondary | ICD-10-CM

## 2023-12-22 DIAGNOSIS — M1990 Unspecified osteoarthritis, unspecified site: Secondary | ICD-10-CM

## 2023-12-22 MED ORDER — ROSUVASTATIN CALCIUM 10 MG PO TABS: 10.0000 mg | ORAL_TABLET | Freq: Every day | ORAL | 1 refills | Status: AC

## 2023-12-22 MED ORDER — AIRSUPRA 90-80 MCG/ACT IN AERO: 2.0000 | INHALATION_SPRAY | Freq: Three times a day (TID) | RESPIRATORY_TRACT | 1 refills | Status: AC | PRN

## 2023-12-22 MED ORDER — CITALOPRAM HYDROBROMIDE 40 MG PO TABS: 40.0000 mg | ORAL_TABLET | Freq: Every day | ORAL | 1 refills | Status: AC

## 2023-12-22 NOTE — Assessment & Plan Note (Signed)
 Initial BP elevated but manual repeat improved Continue lisinopril  10 mg daily Reinforced DASH diet C-Met today

## 2023-12-22 NOTE — Assessment & Plan Note (Signed)
 C-Met and lipid profile today Encouraged her to consume a low-fat diet Continue rosuvastatin  10 mg daily

## 2023-12-22 NOTE — Assessment & Plan Note (Signed)
 Avoid foods that trigger reflux Will try to wean omeprazole  to 20 mg once daily

## 2023-12-22 NOTE — Progress Notes (Signed)
 Subjective:    Patient ID: Sheri Hood, female    DOB: Apr 17, 1965, 58 y.o.   MRN: [id number]  HPI  Patient presents to clinic today for 58-month follow-up of chronic conditions.  Anxiety and depression: Chronic, managed on citalopram .  She is not currently seeing a therapist.  She denies SI/HI.  Asthma: She denies chronic cough or shortness of breath.  She uses albuterol  only as needed.  There are no PFTs on file.  She does not follow with pulmonology.  OA: Generalized. She takes ibuprofen as needed with good relief of symptoms.  She does not follow with orthopedics.  HTN: Her BP today is 158/90. She has been stressed lately. She reports it has been running a little elevated at home.  She is taking lisinopril  as prescribed.  ECG from 11/2020 reviewed.  GERD: Triggered by everything she eats.  She denies breakthrough on omeprazole .  There is no upper GI on file.  DM2: Her last A1c was 5.1%, 07/2023.  She is taking mounjaro  as prescribed.  She does not check her sugars.  She checks her feet routinely.  Her last eye exam was 01/2023.  Flu 12/2021.  Pneumovax 12/2021.  COVID never.  She follows with endocrinology.  OSA: She averages 7 hours of sleep per night without the use of her CPAP.  There is no sleep study on file.  HLD: Her last LDL was 71, triglycerides 81, 07/2023.  She denies myalgias on rosuvastatin .  She does not consume a low-fat diet.  Review of Systems     Past Medical History:  Diagnosis Date   Allergy    Anemia    s/p ablation for heavy bleeding    Anxiety    Arthritis    arhtritis middle back, scolosis   Asthma    Depression    Diabetes mellitus without complication (HCC)    GERD (gastroesophageal reflux disease)    Headache(784.0)    h/x migraines   Heart murmur    recent diagnosed- / ehho 12/30/10 report on chart   Hiatal hernia    Hyperlipidemia    Hypertension    Recurrent upper respiratory infection (URI)    occ sore throat and slightly stuffy-  no fever or cough   Sleep apnea    On CPAP   Sleep apnea     Current Outpatient Medications  Medication Sig Dispense Refill   albuterol  (VENTOLIN  HFA) 108 (90 Base) MCG/ACT inhaler Inhale 1-2 puffs into the lungs every 6 (six) hours as needed for wheezing or shortness of breath. 18 g 0   citalopram  (CELEXA ) 40 MG tablet Take 1 tablet (40 mg total) by mouth daily. 90 tablet 1   glucose blood (BAYER CONTOUR NEXT TEST) test strip Use as instructed to test blood sugar once daily. E11.9 100 each 12   ibuprofen (ADVIL,MOTRIN) 200 MG tablet Take 200 mg by mouth every 6 (six) hours as needed. For pain     lisinopril  (ZESTRIL ) 10 MG tablet Take 3 tablets (30 mg total) by mouth daily. 270 tablet 1   Microlet Lancets MISC USE AS DIRECTED 1 ONCE DAILY TO TEST BLOOD SUGAR 100 each 0   omeprazole  (PRILOSEC ) 20 MG capsule Take 20 mg by mouth daily.     rosuvastatin  (CRESTOR ) 10 MG tablet Take 1 tablet (10 mg total) by mouth daily. 30 tablet 0   tirzepatide  (MOUNJARO ) 10 MG/0.5ML Pen Inject 10 mg into the skin once a week. 6 mL 1   vitamin E  400 UNIT  capsule Take 800 Units by mouth at bedtime.     Current Facility-Administered Medications  Medication Dose Route Frequency Provider Last Rate Last Admin   lidocaine  (PF) (XYLOCAINE ) 1 % injection 2 mL  2 mL Intradermal Once Mecum, Erin E, PA-C        Allergies  Allergen Reactions   Benadryl [Diphenhydramine Hcl] Other (See Comments)    Keeps awake, jittery    Family History  Problem Relation Age of Onset   Hypertension Mother    Glaucoma Mother    Arthritis Mother    Heart disease Father    Varicose Veins Father    Obesity Sister    Diabetes Paternal Grandmother    Hearing loss Maternal Grandmother    Diabetes Paternal Grandfather    Stroke Paternal Grandfather    Anxiety disorder Sister    Depression Sister    Obesity Sister    Asthma Son    Breast cancer Neg Hx    Colon cancer Neg Hx    Esophageal cancer Neg Hx    Rectal cancer Neg Hx     Stomach cancer Neg Hx     Social History   Socioeconomic History   Marital status: Married    Spouse name: Not on file   Number of children: Not on file   Years of education: Not on file   Highest education level: 12th grade  Occupational History   Not on file  Tobacco Use   Smoking status: Former    Current packs/day: 0.00    Average packs/day: 0.5 packs/day for 4.0 years (2.0 ttl pk-yrs)    Types: Cigarettes    Start date: 01/02/1984    Quit date: 01/02/1988    Years since quitting: 35.9   Smokeless tobacco: Never  Vaping Use   Vaping status: Never Used  Substance and Sexual Activity   Alcohol use: Not Currently    Comment: If that much   Drug use: No   Sexual activity: Yes    Birth control/protection: Post-menopausal  Other Topics Concern   Not on file  Social History Narrative   Not on file   Social Drivers of Health   Financial Resource Strain: Low Risk  (06/22/2023)   Overall Financial Resource Strain (CARDIA)    Difficulty of Paying Living Expenses: Not very hard  Food Insecurity: No Food Insecurity (06/22/2023)   Hunger Vital Sign    Worried About Running Out of Food in the Last Year: Never true    Ran Out of Food in the Last Year: Never true  Transportation Needs: No Transportation Needs (06/22/2023)   PRAPARE - Administrator, Civil Service (Medical): No    Lack of Transportation (Non-Medical): No  Physical Activity: Unknown (06/22/2023)   Exercise Vital Sign    Days of Exercise per Week: 0 days    Minutes of Exercise per Session: Not on file  Stress: No Stress Concern Present (06/22/2023)   [employer] of Occupational Health - Occupational Stress Questionnaire    Feeling of Stress : Only a little  Social Connections: Socially Integrated (06/22/2023)   Social Connection and Isolation Panel    Frequency of Communication with Friends and Family: More than three times a week    Frequency of Social Gatherings with Friends and Family: Once a  week    Attends Religious Services: 1 to 4 times per year    Active Member of [employer] or Organizations: Yes    Attends Banker Meetings: 1 to  4 times per year    Marital Status: Married  Catering manager Violence: Not on file     Constitutional: Denies fever, malaise, fatigue, headache or abrupt weight changes.  HEENT: Pt reports ear fullness. Denies eye pain, eye redness, ear pain, ringing in the ears, wax buildup, runny nose, nasal congestion, bloody nose, or sore throat. Respiratory: Denies difficulty breathing, shortness of breath, cough or sputum production.   Cardiovascular: Denies chest pain, chest tightness, palpitations or swelling in the hands or feet.  Gastrointestinal: Denies abdominal pain, bloating, constipation, diarrhea or blood in the stool.  GU: Pt reports nocturia. Denies urgency, frequency, pain with urination, burning sensation, blood in urine, odor or discharge. Musculoskeletal: Patient reports joint pain.  Denies decrease in range of motion, difficulty with gait, muscle pain or joint swelling.  Skin: Denies redness, rashes, lesions or ulcercations.  Neurological: Denies dizziness, difficulty with memory, difficulty with speech or problems with balance and coordination.  Psych: Patient has a history of anxiety and depression.  Denies SI/HI.  No other specific complaints in a complete review of systems (except as listed in HPI above).  Objective:   Physical Exam  BP 132/80   Ht 5' 2 (1.575 m)   Wt 125 lb 6.4 oz (56.9 kg)   BMI 22.94 kg/m    Wt Readings from Last 3 Encounters:  06/22/23 128 lb 3.2 oz (58.2 kg)  12/19/22 145 lb (65.8 kg)  06/13/22 168 lb (76.2 kg)    General: Appears her stated age, well-nourished, well-developed in NAD. Skin: Warm, dry and intact. No ulcerations noted. HEENT: Head: normal shape and size; Eyes: sclera white, no icterus, conjunctiva pink, PERRLA and EOMs intact; Ears: TM gray and intact, normal light  reflex Cardiovascular: Normal rate and rhythm. S1,S2 noted.  No murmur, rubs or gallops noted. No JVD or BLE edema. No carotid bruits noted. Pulmonary/Chest: Normal effort and positive vesicular breath sounds. No respiratory distress. No wheezes, rales or ronchi noted.  Abdomen: Soft and nontender.  Musculoskeletal: No joint swelling noted.  No difficulty with gait.  Neurological: Alert and oriented. Coordination normal.  Psychiatric: Mood and affect normal. Behavior is normal. Judgment and thought content normal.    BMET    Component Value Date/Time   NA 139 07/20/2023 0954   K 4.2 07/20/2023 0954   CL 104 07/20/2023 0954   CO2 22 07/20/2023 0954   GLUCOSE 76 07/20/2023 0954   GLUCOSE 127 (H) 11/16/2020 1259   BUN 14 07/20/2023 0954   CREATININE 0.70 07/20/2023 0954   CALCIUM  9.3 07/20/2023 0954   GFRNONAA >60 11/16/2020 1259   GFRAA 94 07/05/2019 1327    Lipid Panel     Component Value Date/Time   CHOL 137 07/20/2023 0954   TRIG 81 07/20/2023 0954   HDL 50 07/20/2023 0954   CHOLHDL 2.7 07/20/2023 0954   LDLCALC 71 07/20/2023 0954    CBC    Component Value Date/Time   WBC 3.7 07/20/2023 0954   WBC 4.8 11/16/2020 1259   RBC 4.67 07/20/2023 0954   RBC 4.84 11/16/2020 1259   HGB 13.6 07/20/2023 0954   HCT 42.6 07/20/2023 0954   PLT 186 07/20/2023 0954   MCV 91 07/20/2023 0954   MCH 29.1 07/20/2023 0954   MCH 29.5 11/16/2020 1259   MCHC 31.9 07/20/2023 0954   MCHC 35.0 11/16/2020 1259   RDW 12.5 07/20/2023 0954   LYMPHSABS 1.7 01/12/2017 1324   EOSABS 0.1 01/12/2017 1324   BASOSABS 0.0 01/12/2017 1324  Hgb A1C Lab Results  Component Value Date   HGBA1C 5.1 07/20/2023           Assessment & Plan:     RTC in 6 months, for your annual exam Angeline Laura, NP

## 2023-12-22 NOTE — Patient Instructions (Signed)

## 2023-12-22 NOTE — Assessment & Plan Note (Signed)
Not wearing CPAP due to extensive weight loss

## 2023-12-22 NOTE — Assessment & Plan Note (Signed)
 A1c today Urine microalbumin has been checked within the last year Continue mounjaro  10 mg weekly Encourage low-carb diet and exercise for weight loss Encouraged routine eye exam Encouraged routine foot exam She declines flu shot today Pneumovax UTD Prevnar 20 today

## 2023-12-22 NOTE — Assessment & Plan Note (Signed)
 Will discontinue albuterol  and trial airsupra 90-80 mcg per actuation

## 2023-12-22 NOTE — Assessment & Plan Note (Signed)
Continue ibuprofen OTC as needed

## 2023-12-22 NOTE — Assessment & Plan Note (Signed)
 Stable on citalopram 40 mg daily Support offered

## 2024-01-08 DIAGNOSIS — E119 Type 2 diabetes mellitus without complications: Secondary | ICD-10-CM | POA: Diagnosis not present

## 2024-01-09 LAB — LIPID PANEL
Chol/HDL Ratio: 2.3 ratio (ref 0.0–4.4)
Cholesterol, Total: 152 mg/dL (ref 100–199)
HDL: 65 mg/dL (ref >39)
LDL Chol Calc (NIH): 76 mg/dL (ref 0–99)
Triglycerides: 54 mg/dL (ref 0–149)
VLDL Cholesterol Cal: 11 mg/dL (ref 5–40)

## 2024-01-09 LAB — CBC
Hematocrit: 42.7 % (ref 34.0–46.6)
Hemoglobin: 13.7 g/dL (ref 11.1–15.9)
MCH: 29.3 pg (ref 26.6–33.0)
MCHC: 32.1 g/dL (ref 31.5–35.7)
MCV: 91 fL (ref 79–97)
Platelets: 191 x10E3/uL (ref 150–450)
RBC: 4.67 x10E6/uL (ref 3.77–5.28)
RDW: 12.5 % (ref 11.7–15.4)
WBC: 3.6 x10E3/uL (ref 3.4–10.8)

## 2024-01-09 LAB — COMPREHENSIVE METABOLIC PANEL WITH GFR
ALT: 19 IU/L (ref 0–32)
AST: 21 IU/L (ref 0–40)
Albumin: 4.6 g/dL (ref 3.8–4.9)
Alkaline Phosphatase: 48 IU/L — ABNORMAL LOW (ref 49–135)
BUN/Creatinine Ratio: 17 (ref 9–23)
BUN: 16 mg/dL (ref 6–24)
Bilirubin Total: 0.5 mg/dL (ref 0.0–1.2)
CO2: 20 mmol/L (ref 20–29)
Calcium: 9.4 mg/dL (ref 8.7–10.2)
Chloride: 105 mmol/L (ref 96–106)
Creatinine, Ser: 0.92 mg/dL (ref 0.57–1.00)
Globulin, Total: 2.2 g/dL (ref 1.5–4.5)
Glucose: 78 mg/dL (ref 70–99)
Potassium: 4.6 mmol/L (ref 3.5–5.2)
Sodium: 141 mmol/L (ref 134–144)
Total Protein: 6.8 g/dL (ref 6.0–8.5)
eGFR: 73 mL/min/1.73 (ref >59)

## 2024-01-09 LAB — HEMOGLOBIN A1C
Est. average glucose Bld gHb Est-mCnc: 97 mg/dL
Hgb A1c MFr Bld: 5 % (ref 4.8–5.6)

## 2024-01-11 ENCOUNTER — Other Ambulatory Visit: Payer: Self-pay | Admitting: Internal Medicine

## 2024-01-11 ENCOUNTER — Ambulatory Visit: Payer: Self-pay | Admitting: Internal Medicine

## 2024-01-11 DIAGNOSIS — Z1231 Encounter for screening mammogram for malignant neoplasm of breast: Secondary | ICD-10-CM

## 2024-02-02 LAB — OPHTHALMOLOGY REPORT-SCANNED

## 2024-02-09 ENCOUNTER — Ambulatory Visit
Admission: RE | Admit: 2024-02-09 | Discharge: 2024-02-09 | Disposition: A | Source: Ambulatory Visit | Attending: Internal Medicine | Admitting: Internal Medicine

## 2024-02-09 DIAGNOSIS — Z1231 Encounter for screening mammogram for malignant neoplasm of breast: Secondary | ICD-10-CM | POA: Diagnosis not present

## 2024-02-26 ENCOUNTER — Encounter: Payer: Self-pay | Admitting: Internal Medicine

## 2024-02-26 ENCOUNTER — Telehealth: Admitting: Internal Medicine

## 2024-02-26 DIAGNOSIS — J4520 Mild intermittent asthma, uncomplicated: Secondary | ICD-10-CM

## 2024-02-26 DIAGNOSIS — J00 Acute nasopharyngitis [common cold]: Secondary | ICD-10-CM | POA: Diagnosis not present

## 2024-02-26 MED ORDER — CEFDINIR 300 MG PO CAPS: 300.0000 mg | ORAL_CAPSULE | Freq: Two times a day (BID) | ORAL | 0 refills | Status: AC

## 2024-02-26 MED ORDER — PREDNISONE 20 MG PO TABS: 20.0000 mg | ORAL_TABLET | Freq: Every day | ORAL | 0 refills | Status: AC

## 2024-02-26 MED ORDER — HYDROCODONE BIT-HOMATROP MBR 5-1.5 MG/5ML PO SOLN: 5.0000 mL | Freq: Three times a day (TID) | ORAL | 0 refills | Status: AC | PRN

## 2024-02-26 NOTE — Progress Notes (Signed)
 Virtual Visit via Video Note  I connected with Sheri Hood on 02/26/2024 at  9:00 AM EST by a video enabled telemedicine application and verified that I am speaking with the correct person using two identifiers.  Location: Patient: Home Provider: Office  Person's participating in this video call: Angeline Laura, NP-C and Arliene Rosenow   I discussed the limitations of evaluation and management by telemedicine and the availability of in person appointments. The patient expressed understanding and agreed to proceed.  History of Present Illness:   Discussed the use of AI scribe software for clinical note transcription with the patient, who gave verbal consent to proceed.  Sheri Hood is a 58 year old female with asthma and diabetes who presents with a sore throat and chest congestion.  She has been experiencing a sore throat and chest congestion for the past two weeks. She has a cough and uses a [employer] brand Mucinex and her inhaler (Airsupra ) to manage symptoms. The inhaler alleviates chest tightness. No wheezing or shortness of breath, but occasional chest tightness is noted. She experiences nasal congestion, rhinorrhea, and otalgia. Coughing or blowing her nose sometimes produces yellow mucus.  No headaches, sinus pressure, nausea, vomiting, diarrhea, fever, chills, or myalgias. For throat pain, she uses ibuprofen. She also uses Delsym for her cough and finds that chewing gum provides some relief.  She reports that she has not had success with Z-Pak in the past.      Past Medical History:  Diagnosis Date   Allergy    Anemia    s/p ablation for heavy bleeding    Anxiety    Arthritis    arhtritis middle back, scolosis   Asthma    Depression    Diabetes mellitus without complication (HCC)    GERD (gastroesophageal reflux disease)    Headache(784.0)    h/x migraines   Heart murmur    recent diagnosed- / ehho 12/30/10 report on chart   Hiatal hernia    Hyperlipidemia     Hypertension    Recurrent upper respiratory infection (URI)    occ sore throat and slightly stuffy- no fever or cough   Sleep apnea    On CPAP   Sleep apnea     Current Outpatient Medications  Medication Sig Dispense Refill   AIRSUPRA  90-80 MCG/ACT AERO Inhale 2 Inhalations into the lungs every 8 (eight) hours as needed (wheezing, shortness of breath, cough). 32.1 g 1   citalopram  (CELEXA ) 40 MG tablet Take 1 tablet (40 mg total) by mouth daily. 90 tablet 1   glucose blood (BAYER CONTOUR NEXT TEST) test strip Use as instructed to test blood sugar once daily. E11.9 100 each 12   hydrOXYzine (ATARAX) 25 MG tablet Take 25 mg by mouth at bedtime.     ibuprofen (ADVIL,MOTRIN) 200 MG tablet Take 200 mg by mouth every 6 (six) hours as needed. For pain     lisinopril  (ZESTRIL ) 10 MG tablet Take 3 tablets (30 mg total) by mouth daily. 270 tablet 1   Microlet Lancets MISC USE AS DIRECTED 1 ONCE DAILY TO TEST BLOOD SUGAR 100 each 0   omeprazole  (PRILOSEC ) 20 MG capsule Take 20 mg by mouth daily.     rosuvastatin  (CRESTOR ) 10 MG tablet Take 1 tablet (10 mg total) by mouth daily. 90 tablet 1   tirzepatide  (MOUNJARO ) 10 MG/0.5ML Pen Inject 10 mg into the skin once a week. 6 mL 1   triamcinolone  cream (KENALOG ) 0.1 % Apply topically 2 (two)  times daily.     vitamin E  400 UNIT capsule Take 800 Units by mouth at bedtime.     No current facility-administered medications for this visit.    Allergies[1]  Family History  Problem Relation Age of Onset   Hypertension Mother    Glaucoma Mother    Arthritis Mother    Heart disease Father    Varicose Veins Father    Obesity Sister    Diabetes Paternal Grandmother    Hearing loss Maternal Grandmother    Diabetes Paternal Grandfather    Stroke Paternal Grandfather    Anxiety disorder Sister    Depression Sister    Obesity Sister    Asthma Son    Breast cancer Neg Hx    Colon cancer Neg Hx    Esophageal cancer Neg Hx    Rectal cancer Neg Hx     Stomach cancer Neg Hx     Social History   Socioeconomic History   Marital status: Married    Spouse name: Not on file   Number of children: Not on file   Years of education: Not on file   Highest education level: 12th grade  Occupational History   Not on file  Tobacco Use   Smoking status: Former    Current packs/day: 0.00    Average packs/day: 0.5 packs/day for 4.0 years (2.0 ttl pk-yrs)    Types: Cigarettes    Start date: 01/02/1984    Quit date: 01/02/1988    Years since quitting: 36.1   Smokeless tobacco: Never  Vaping Use   Vaping status: Never Used  Substance and Sexual Activity   Alcohol use: Not Currently    Comment: If that much   Drug use: No   Sexual activity: Yes    Birth control/protection: Post-menopausal  Other Topics Concern   Not on file  Social History Narrative   Not on file   Social Drivers of Health   Tobacco Use: Medium Risk (12/22/2023)   Patient History    Smoking Tobacco Use: Former    Smokeless Tobacco Use: Never    Passive Exposure: Not on Actuary Strain: Low Risk (02/24/2024)   Overall Financial Resource Strain (CARDIA)    Difficulty of Paying Living Expenses: Not very hard  Food Insecurity: No Food Insecurity (02/24/2024)   Epic    Worried About Radiation Protection Practitioner of Food in the Last Year: Never true    Ran Out of Food in the Last Year: Never true  Transportation Needs: No Transportation Needs (02/24/2024)   Epic    Lack of Transportation (Medical): No    Lack of Transportation (Non-Medical): No  Physical Activity: Inactive (02/24/2024)   Exercise Vital Sign    Days of Exercise per Week: 0 days    Minutes of Exercise per Session: Not on file  Stress: No Stress Concern Present (02/24/2024)   [employer] of Occupational Health - Occupational Stress Questionnaire    Feeling of Stress: Only a little  Social Connections: Socially Integrated (02/24/2024)   Social Connection and Isolation Panel    Frequency of  Communication with Friends and Family: More than three times a week    Frequency of Social Gatherings with Friends and Family: Once a week    Attends Religious Services: 1 to 4 times per year    Active Member of [employer] or Organizations: Yes    Attends Banker Meetings: 1 to 4 times per year    Marital Status: Married  Intimate  Partner Violence: Not on file  Depression (PHQ2-9): Low Risk (12/22/2023)   Depression (PHQ2-9)    PHQ-2 Score: 3  Alcohol Screen: Low Risk (02/24/2024)   Alcohol Screen    Last Alcohol Screening Score (AUDIT): 1  Housing: Unknown (02/24/2024)   Epic    Unable to Pay for Housing in the Last Year: No    Number of Times Moved in the Last Year: Not on file    Homeless in the Last Year: No  Utilities: Not on file  Health Literacy: Not on file     Constitutional: Denies fever, malaise, fatigue, headache or abrupt weight changes.  HEENT: Patient reports  runny nose, nasal congestion, ear pain and sore throat.  Denies eye pain, eye redness, ringing in the ears, wax buildup, bloody nose. Respiratory: Patient reports cough.  Denies difficulty breathing, shortness of breath, or sputum production.   Cardiovascular: Pt reports chest tightness. Denies chest pain, palpitations or swelling in the hands or feet.  Gastrointestinal: Denies abdominal pain, bloating, constipation, diarrhea or blood in the stool.  Musculoskeletal: Patient reports chronic joint pain.  Denies decrease in range of motion, difficulty with gait, muscle pain or joint swelling.  Skin: Denies redness, rashes, lesions or ulcercations.  Neurological: Denies dizziness, difficulty with memory, difficulty with speech or problems with balance and coordination.   No other specific complaints in a complete review of systems (except as listed in HPI above).  Observations/Objective:  Wt Readings from Last 3 Encounters:  12/22/23 125 lb 6.4 oz (56.9 kg)  06/22/23 128 lb 3.2 oz (58.2 kg)  12/19/22  145 lb (65.8 kg)    General: Appears her stated age, well developed, well nourished in NAD. HEENT: Head: normal shape and size, no sinus pressure reported; Nose: congestion noted; Throat/Mouth: hoarseness noted.  Pulmonary/Chest: Normal effort. No respiratory distress.  Neurological: Alert and oriented.   BMET    Component Value Date/Time   NA 141 01/08/2024 1801   K 4.6 01/08/2024 1801   CL 105 01/08/2024 1801   CO2 20 01/08/2024 1801   GLUCOSE 78 01/08/2024 1801   GLUCOSE 127 (H) 11/16/2020 1259   BUN 16 01/08/2024 1801   CREATININE 0.92 01/08/2024 1801   CALCIUM  9.4 01/08/2024 1801   GFRNONAA >60 11/16/2020 1259   GFRAA 94 07/05/2019 1327    Lipid Panel     Component Value Date/Time   CHOL 152 01/08/2024 1801   TRIG 54 01/08/2024 1801   HDL 65 01/08/2024 1801   CHOLHDL 2.3 01/08/2024 1801   LDLCALC 76 01/08/2024 1801    CBC    Component Value Date/Time   WBC 3.6 01/08/2024 1801   WBC 4.8 11/16/2020 1259   RBC 4.67 01/08/2024 1801   RBC 4.84 11/16/2020 1259   HGB 13.7 01/08/2024 1801   HCT 42.7 01/08/2024 1801   PLT 191 01/08/2024 1801   MCV 91 01/08/2024 1801   MCH 29.3 01/08/2024 1801   MCH 29.5 11/16/2020 1259   MCHC 32.1 01/08/2024 1801   MCHC 35.0 11/16/2020 1259   RDW 12.5 01/08/2024 1801   LYMPHSABS 1.7 01/12/2017 1324   EOSABS 0.1 01/12/2017 1324   BASOSABS 0.0 01/12/2017 1324    Hgb A1C Lab Results  Component Value Date   HGBA1C 5.0 01/08/2024       Assessment and Plan:  Assessment and Plan    Acute upper respiratory infection Symptoms suggest bacterial etiology due to duration. -Encouraged rest and fluids - Prescribed Omnicef  300 mg twice daily for 10 days. -  Prescribed Hycodan 5-1.5 mg / 5 mL cough syrup every 8 hours as needed for cough relief-sedation caution given. - Prescribed prednisone  20 mg x 5 days to take if she develops wheezing or shortness of breath - Advised follow-up if no improvement.  Asthma Well-controlled with  Air Supra inhaler. Occasional chest tightness noted. - Prescribed prednisone  20 mg for 5 days if wheezing or shortness of breath occurs. - Advised not to take prednisone  unless symptoms develop.       RTC in 4 months for your annual exam. Follow Up Instructions:    I discussed the assessment and treatment plan with the patient. The patient was provided an opportunity to ask questions and all were answered. The patient agreed with the plan and demonstrated an understanding of the instructions.   The patient was advised to call back or seek an in-person evaluation if the symptoms worsen or if the condition fails to improve as anticipated.   Angeline Laura, NP     [1]  Allergies Allergen Reactions   Benadryl [Diphenhydramine Hcl] Other (See Comments)    Keeps awake, jittery

## 2024-02-26 NOTE — Patient Instructions (Signed)

## 2024-02-29 ENCOUNTER — Other Ambulatory Visit (HOSPITAL_COMMUNITY): Payer: Self-pay

## 2024-02-29 ENCOUNTER — Telehealth: Payer: Self-pay

## 2024-02-29 NOTE — Telephone Encounter (Signed)
 Pharmacy Patient Advocate Encounter  Received notification from [employer] that Prior Authorization for Mounjaro  10 has been APPROVED from 02/29/24 to 02/28/25. Unable to obtain price due to refill too soon rejection, last fill date 02/21/24 next available fill date2/22/26   PA #/Case ID/Reference #:# [id number]

## 2024-02-29 NOTE — Telephone Encounter (Signed)
 Pharmacy Patient Advocate Encounter   Received notification from Onbase that prior authorization for Mounjaro  10 is required/requested.   Insurance verification completed.   The patient is insured through [facility].   Per test claim: PA required; PA submitted to above mentioned insurance via Latent Key/confirmation #/EOC [id number] Status is pending

## 2024-06-24 ENCOUNTER — Encounter: Admitting: Internal Medicine
